# Patient Record
Sex: Male | Born: 1960 | Race: Black or African American | Hispanic: No | Marital: Single | Smoking: Current every day smoker
Health system: Southern US, Community
[De-identification: ages and names within clinical notes are randomized; demographics above are authoritative.]

## PROBLEM LIST (undated history)

## (undated) DIAGNOSIS — I10 Essential (primary) hypertension: Secondary | ICD-10-CM

## (undated) DIAGNOSIS — Z9911 Dependence on respirator [ventilator] status: Secondary | ICD-10-CM

## (undated) DIAGNOSIS — R569 Unspecified convulsions: Secondary | ICD-10-CM

## (undated) DIAGNOSIS — F1010 Alcohol abuse, uncomplicated: Secondary | ICD-10-CM

## (undated) DIAGNOSIS — E119 Type 2 diabetes mellitus without complications: Secondary | ICD-10-CM

---

## 2006-03-24 ENCOUNTER — Emergency Department: Payer: Self-pay | Admitting: Emergency Medicine

## 2010-10-06 ENCOUNTER — Emergency Department: Payer: Self-pay | Admitting: Emergency Medicine

## 2010-10-21 ENCOUNTER — Emergency Department: Payer: Self-pay | Admitting: Emergency Medicine

## 2010-12-24 ENCOUNTER — Inpatient Hospital Stay: Payer: Self-pay | Admitting: Internal Medicine

## 2011-03-22 ENCOUNTER — Inpatient Hospital Stay: Payer: Self-pay | Admitting: Internal Medicine

## 2011-04-10 ENCOUNTER — Inpatient Hospital Stay: Payer: Self-pay | Admitting: Psychiatry

## 2012-04-12 LAB — CBC
HCT: 42.5 % (ref 40.0–52.0)
HGB: 14.2 g/dL (ref 13.0–18.0)
MCH: 30.5 pg (ref 26.0–34.0)
MCHC: 33.3 g/dL (ref 32.0–36.0)
MCV: 92 fL (ref 80–100)
Platelet: 232 10*3/uL (ref 150–440)
RDW: 12.8 % (ref 11.5–14.5)

## 2012-04-12 LAB — COMPREHENSIVE METABOLIC PANEL
Albumin: 3.9 g/dL (ref 3.4–5.0)
Anion Gap: 19 — ABNORMAL HIGH (ref 7–16)
BUN: 7 mg/dL (ref 7–18)
Bilirubin,Total: 0.3 mg/dL (ref 0.2–1.0)
EGFR (African American): >60
EGFR (Non-African Amer.): 59 — ABNORMAL LOW
Osmolality: 279 (ref 275–301)
Potassium: 3.4 mmol/L — ABNORMAL LOW (ref 3.5–5.1)
SGOT(AST): 38 U/L — ABNORMAL HIGH (ref 15–37)
Total Protein: 8.1 g/dL (ref 6.4–8.2)

## 2012-04-12 LAB — TSH: Thyroid Stimulating Horm: 1.41 u[IU]/mL

## 2012-04-12 LAB — PHENYTOIN LEVEL, TOTAL: Dilantin: 1.8 ug/mL — ABNORMAL LOW (ref 10.0–20.0)

## 2012-04-13 ENCOUNTER — Inpatient Hospital Stay: Payer: Self-pay | Admitting: Psychiatry

## 2012-04-13 LAB — URINALYSIS, COMPLETE
Bilirubin,UR: NEGATIVE
Cellular Cast: 4
Glucose,UR: 50 mg/dL (ref 0–75)
Granular Cast: 3
Hyaline Cast: 10
Nitrite: NEGATIVE
Ph: 5 (ref 4.5–8.0)
Specific Gravity: 1.008 (ref 1.003–1.030)
Squamous Epithelial: <1
WBC UR: 5 /HPF (ref 0–5)

## 2012-04-13 LAB — DRUG SCREEN, URINE
Amphetamines, Ur Screen: NEGATIVE (ref ?–1000)
Barbiturates, Ur Screen: NEGATIVE (ref ?–200)
Cannabinoid 50 Ng, Ur ~~LOC~~: NEGATIVE (ref ?–50)
Cocaine Metabolite,Ur ~~LOC~~: NEGATIVE (ref ?–300)
MDMA (Ecstasy)Ur Screen: NEGATIVE (ref ?–500)
Methadone, Ur Screen: NEGATIVE (ref ?–300)
Phencyclidine (PCP) Ur S: NEGATIVE (ref ?–25)
Tricyclic, Ur Screen: NEGATIVE (ref ?–1000)

## 2015-01-04 ENCOUNTER — Emergency Department: Payer: Self-pay | Admitting: Emergency Medicine

## 2015-01-04 LAB — URINALYSIS, COMPLETE
BACTERIA: NONE SEEN
BLOOD: NEGATIVE
Bilirubin,UR: NEGATIVE
Glucose,UR: NEGATIVE mg/dL (ref 0–75)
KETONE: NEGATIVE
Leukocyte Esterase: NEGATIVE
Nitrite: NEGATIVE
PROTEIN: NEGATIVE
Ph: 5 (ref 4.5–8.0)
RBC,UR: <1 /HPF (ref 0–5)
SPECIFIC GRAVITY: 1.016 (ref 1.003–1.030)
Squamous Epithelial: NONE SEEN
WBC UR: <1 /HPF (ref 0–5)

## 2015-02-08 ENCOUNTER — Emergency Department: Payer: Self-pay | Admitting: Student

## 2015-02-13 ENCOUNTER — Emergency Department: Payer: Self-pay | Admitting: Emergency Medicine

## 2015-03-11 ENCOUNTER — Emergency Department: Payer: Self-pay | Admitting: Emergency Medicine

## 2015-03-12 ENCOUNTER — Emergency Department: Payer: Self-pay | Admitting: Emergency Medicine

## 2015-03-14 ENCOUNTER — Emergency Department: Payer: Self-pay | Admitting: Emergency Medicine

## 2015-04-22 NOTE — H&P (Signed)
PATIENT NAME:  Brandon Silva, Brandon Silva MR#:  [id number] DATE OF BIRTH:  06-15-1961  DATE OF ADMISSION:  04/12/2012  REFERRING PHYSICIAN: Lowella FairyJohn Woodruff, MD   ATTENDING PHYSICIAN: Brandon Millward B. Jennet MaduroPucilowska, MD   IDENTIFYING DATA: [name] is a 54 year old male with history of psychotic depression.   CHIEF COMPLAINT: I have responsibilities at home.   HISTORY OF PRESENT ILLNESS: [name] has not been consistent taking his medications as prescribed at discharge from [facility] in April of 2012. He does go to [facility] but missed some of his medication. Apparently Risperdal was discontinued or he just stopped taking it. He was reportedly doing fine up until the day of admission when his girlfriend moved back to [place]. He overdosed on medications. His mother found him unresponsive with a bunch of empty pill bottles. He was brought to the Emergency Room. He reports that he remembers little of the event but does admit that he is heartbroken after the girlfriend left. He also points out that he was drunk when he overdosed and wants to be discharged to home as quickly as possible to go back home to take care of his puppy. He reports doing well until a recent conflict with the girlfriend. He did not realize that she was leaving until someone came to pick her up. He endorses on and off psychotic symptoms, slight paranoia, but mostly auditory hallucinations. It is unclear why Risperdal was stopped. He denies excessive drinking or illicit substance use and feels that getting drunk on the night of admission was a stupid thing to do. He denies symptoms suggestive of bipolar mania. No excessive anxiety.   PAST PSYCHIATRIC HISTORY: He had several prior hospitalizations. He was admitted once in 2001 after an overdose to CCU here and was hospitalized as above in April of 2012 for worsening of depression and psychotic symptoms. He had a period of time when he was using cocaine but claims to be sober now.    FAMILY PSYCHIATRIC HISTORY: None reported.   PAST MEDICAL HISTORY:  1. Diabetes.  2. History of seizures. 3. Gout. 4. Hypertension.   ALLERGIES: Penicillin.   MEDICATIONS ON ADMISSION:  1. Allopurinol 300 mg daily. 2. Insulin 70/30 35 units in the morning; 16 units at bedtime.  3. Metformin 500 mg twice daily.  4. Paxil 20 mg daily.  5. Risperdal 2 mg daily. Unclear if the patient was compliant.   SOCIAL HISTORY: [place], [place]  over a year ago. He lives in a camper on his mother's property. He does not have income. His girlfriend left him but he takes care of a puppy. It is unclear why the patient did not apply for disability.   REVIEW OF SYSTEMS: CONSTITUTIONAL: No fevers or chills. No weight changes. EYES: No double or blurred vision. ENT: No hearing loss. RESPIRATORY: No shortness of breath or cough. CARDIOVASCULAR: No chest pain or orthopnea. GASTROINTESTINAL: No abdominal pain, nausea, vomiting, or diarrhea. GU: No incontinence or frequency. ENDOCRINE: No heat or cold intolerance. LYMPHATIC: No anemia or easy bruising. INTEGUMENTARY: No acne or rash. MUSCULOSKELETAL: No muscle or joint pain. NEUROLOGIC: No tingling or weakness. PSYCHIATRIC: See history of present illness for details.   PHYSICAL EXAMINATION:   VITAL SIGNS: Blood pressure 100/55, pulse 99, respirations 24, temperature 98.5.   GENERAL: This is a slightly obese male in no acute distress.   HEENT: The pupils are equal, round, and reactive to light. Sclerae anicteric.   NECK: Supple. No thyromegaly.  LUNGS: Clear to auscultation. No dullness to percussion.   HEART: Regular rhythm and rate. No murmurs, rubs, or gallops.  ABDOMEN: Soft, nontender, nondistended. Positive bowel sounds.   MUSCULOSKELETAL: Normal muscle strength in all extremities.   SKIN: No rashes or bruises.   LYMPHATIC: No cervical adenopathy.   NEUROLOGIC: Cranial nerves II through XII are intact.    LABORATORY DATA: Chemistries glucose 136, BUN 7, creatinine 1.4, sodium 140, potassium 3.4. Blood alcohol level 0.19. LFTs within normal limits except for AST of 38. Cardiac enzymes CK 726. CK-MB 3.7. TSH 1.41. Dilantin 1.8. Urine tox screen negative for substances. CBC within normal limits. Urinalysis is not suggestive of urinary tract infection. Serum acetaminophen 53; on recheck 20. Serum salicylates 1.7. EKG normal sinus rhythm, nonspecific T wave abnormality, prolonged QT. Abnormal EKG.  MENTAL STATUS EXAMINATION ON ADMISSION: The patient is evaluated in the Emergency Room. He is asleep initially. Reportedly he was agitated. He is easily arousable but not enthusiastic to talk to me. He is oriented to person, place, time, and somewhat situation. He is marginally groomed. He is marginally cooperative. He maintains little eye contact. His speech is soft. There is paucity of speech. His mood is depressed with flat affect. Thought processing is logical and goal oriented. Thought content he denies suicidal or homicidal ideation but was admitted after a suicide attempt. He does not disclose any paranoid or delusional thinking. There is a history of auditory hallucinations but the patient does not appear to attend to internal stimuli during the interview. His cognition is impaired and difficult to assess today. His insight and judgment are poor.   SUICIDE RISK ASSESSMENT ON ADMISSION: This is a patient with a long history of depression, psychosis who overdosed on medication in the context of relationship problems.   ASSESSMENT:  AXIS I:  1. Major depressive disorder, recurrent, severe with psychosis.  2. Alcohol abuse.   AXIS II: Deferred.    AXIS III:  1. Diabetes. 2. Seizures. 3. Gout.   AXIS IV: Mental and physical illness, relationship problems, financial, housing, occupational, social support, access to care.   AXIS V: GAF on admission 25.   PLAN: The patient was admitted to [facility] Medicine Unit for safety, stabilization, and medication management. He was initially placed on suicide precautions and will be closely monitored for any unsafe behaviors. He underwent full psychiatric and risk assessment. He received pharmacotherapy, individual and group psychotherapy, substance abuse counseling, and support from therapeutic milieu.  1. Suicidal ideation. The patient denies now and is able to contract for safety.  2. Mood. We will restart him on Paxil and Risperdal as in the community.  3. Substance abuse. The patient minimizes and declines treatment at present.  4. Disposition. He will be discharged to home and follow-up with [facility].    ____________________________ Ellin Goodie. Jennet Maduro, MD jbp:drc D: 04/13/2012 12:03:53 ET T: 04/13/2012 12:22:31 ET JOB#: [id number]  cc: Alfretta Pinch B. Jennet Maduro, MD, <Dictator> Shari Prows MD ELECTRONICALLY SIGNED 04/16/2012 4:05
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
## 2015-04-29 NOTE — Consult Note (Signed)
PATIENT NAME:  Brandon Silva, Brandon Silva MR#:  [id number] DATE OF BIRTH:  02/12/1961  DATE OF CONSULTATION:  02/14/2015  CONSULTING PHYSICIAN:  Audery AmelJohn T. Clapacs, MD  IDENTIFYING INFORMATION AND REASON FOR CONSULTATION: A 54 year old male with a history of substance abuse and mental health symptoms, who presented to the hospital after having a seizure yesterday.   CHIEF COMPLAINT: "Can I go?"  HISTORY OF PRESENT ILLNESS: Information obtained from the patient and the chart. It is reported that the patient had multiple seizures at home, witnessed by his mother. The patient says that he was drinking yesterday and estimates he had about a 12-pack. He claims that he rarely drinks and that was unusual for him, but at the same time, he cannot give any reason why he might have been drinking more yesterday. Denies that he has been using any other drugs. Says that he is prescribed Dilantin but takes it rarely, maybe only every now and then, for some reason, but not regularly. The patient denies any mood symptoms. Denies problems with depression. Denies suicidal ideation. Denies any hallucinations or psychotic symptoms. Says that he sleeps okay. According to the report when he came into the hospital, his mother had witnessed several seizures over the last few days. There is no report here of anything about suicidality or other psychiatric symptoms.   PAST PSYCHIATRIC HISTORY: [name] has had several prior visits to our hospital, but the last one was in 2013. In the past, he has presented at times with depression and psychotic symptoms but has usually been very intoxicated at the time. He has been variously diagnosed as being primarily substance abuse and primarily mental health. In interview with me today, the patient completely denies all this. Denies that he has ever been in a psychiatric hospital. Denies he has ever been on any psychiatric medicine. Denies he has ever tried to harm himself. We have it clearly documented  that he used to be on Risperdal and other antipsychotics.   SOCIAL HISTORY: The patient is living by himself. He works doing "farming." Mother lives nearby. Seems to have otherwise limited social contact.   FAMILY HISTORY: Denies any family history of mental health problems or substance abuse problems.   PAST MEDICAL HISTORY: He denies having any significant ongoing medical problems initially, but on further conversation states that he does have diabetes and high blood pressure. Says that he takes his insulin regularly. Also is on a high blood pressure medicine, but does not remember what it is. Denies any history of heart attacks or strokes.   SUBSTANCE ABUSE HISTORY: Long history of alcohol and cocaine abuse. The patient is denying it to me, saying that the alcohol use has been rare.   REVIEW OF SYSTEMS: Currently denies depression. Denies suicidal ideation. Denies hallucinations. Denies any pain, malaise or any physical symptoms at all.   MENTAL STATUS EXAMINATION: Adequately groomed gentleman who looks his stated age, cooperative with the interview although tends to answer very superficially. Eye contact good. Psychomotor activity normal. Speech decreased in total amount but normal in tone and volume. Affect is euthymic and reactive. Mood stated as fine. Thoughts are lucid but simple. No obvious delusions. Denies hallucinations. Denies any suicidal or homicidal ideation. He is alert and oriented x4. Can remember 3/3 objects immediately and at 3 minutes. Judgment and insight debatable, long term. Intelligence probably normal range.   LABORATORY RESULTS: Drug screen positive only for benzodiazepines, probably given on the way into the hospital. TSH normal. Alcohol level was 286.  Dilantin undetectable. Calcium low at 8.3, potassium low at 3.4. CBC all normal. Salicylates and acetaminophen negative. Blood sugars are running in the hundreds.   VITAL SIGNS: Blood pressure 138/72, respirations 20, pulse  74, temperature 98.6.   ASSESSMENT: A 54 year old man with a history of substance abuse and a history of past mood and psychotic symptoms, who is currently presenting after a seizure, likely related to his alcohol abuse. The patient is denying any mental health history, although we have it clearly documented. Right now there is no sign of acute suicidality, homicidality or acute dangerousness. Does not require hospital level treatment psychiatrically.   TREATMENT PLAN: The patient is educated about the relationship between alcohol use and seizures and strongly encouraged to make this a priority in stopping drinking. As he has no other acute psychiatric symptoms, we have no further referral for him right now. He is aware that he can get psychiatric treatment outpatient if he needs it. The case discussed with Emergency Room physician. IVC discontinued.   DIAGNOSIS, PRINCIPAL AND PRIMARY:   AXIS I: Alcohol abuse, moderate to severe.   SECONDARY DIAGNOSES:  AXIS I: Deferred.  AXIS II: Deferred.  AXIS III:  1. Seizure, probably related to alcohol.  2. History of high blood pressure and diabetes.    ____________________________ Audery Amel, MD jtc:je D: 02/14/2015 13:25:49 ET T: 02/14/2015 13:55:26 ET JOB#: [id number]  cc: Audery Amel, MD, <Dictator> Audery Amel MD ELECTRONICALLY SIGNED 03/06/2015 10:33

## 2015-05-01 ENCOUNTER — Emergency Department: Payer: Self-pay

## 2015-05-01 ENCOUNTER — Emergency Department
Admission: EM | Admit: 2015-05-01 | Discharge: 2015-05-02 | Disposition: A | Discharge status: Home / Self Care | Payer: Self-pay | Attending: Emergency Medicine | Admitting: Emergency Medicine

## 2015-05-01 DIAGNOSIS — R200 Anesthesia of skin: Secondary | ICD-10-CM | POA: Insufficient documentation

## 2015-05-01 DIAGNOSIS — R531 Weakness: Secondary | ICD-10-CM | POA: Insufficient documentation

## 2015-05-01 DIAGNOSIS — F10920 Alcohol use, unspecified with intoxication, uncomplicated: Secondary | ICD-10-CM

## 2015-05-01 DIAGNOSIS — R569 Unspecified convulsions: Secondary | ICD-10-CM | POA: Insufficient documentation

## 2015-05-01 DIAGNOSIS — E119 Type 2 diabetes mellitus without complications: Secondary | ICD-10-CM | POA: Insufficient documentation

## 2015-05-01 DIAGNOSIS — F10120 Alcohol abuse with intoxication, uncomplicated: Secondary | ICD-10-CM | POA: Insufficient documentation

## 2015-05-01 DIAGNOSIS — Z720 Tobacco use: Secondary | ICD-10-CM | POA: Insufficient documentation

## 2015-05-01 HISTORY — DX: Type 2 diabetes mellitus without complications: E11.9

## 2015-05-01 HISTORY — DX: Alcohol abuse, uncomplicated: F10.10

## 2015-05-01 LAB — CBC
HCT: 48.3 % (ref 40.0–52.0)
HEMOGLOBIN: 16.3 g/dL (ref 13.0–18.0)
MCH: 31 pg (ref 26.0–34.0)
MCHC: 33.7 g/dL (ref 32.0–36.0)
MCV: 91.8 fL (ref 80.0–100.0)
PLATELETS: 225 10*3/uL (ref 150–440)
RBC: 5.26 MIL/uL (ref 4.40–5.90)
RDW: 13 % (ref 11.5–14.5)
WBC: 7 10*3/uL (ref 3.8–10.6)

## 2015-05-01 LAB — BASIC METABOLIC PANEL
Anion gap: 9 (ref 5–15)
BUN: 10 mg/dL (ref 6–20)
CALCIUM: 9.4 mg/dL (ref 8.9–10.3)
CO2: 25 mmol/L (ref 22–32)
Chloride: 106 mmol/L (ref 101–111)
Creatinine, Ser: 1.09 mg/dL (ref 0.61–1.24)
GFR calc Af Amer: >60 mL/min (ref >60)
GFR calc non Af Amer: >60 mL/min (ref >60)
Glucose, Bld: 142 mg/dL — ABNORMAL HIGH (ref 65–99)
POTASSIUM: 4.1 mmol/L (ref 3.5–5.1)
SODIUM: 140 mmol/L (ref 135–145)

## 2015-05-01 LAB — URINE DRUG SCREEN, QUALITATIVE (ARMC ONLY)
Amphetamines, Ur Screen: NOT DETECTED
BARBITURATES, UR SCREEN: NOT DETECTED
BENZODIAZEPINE, UR SCRN: POSITIVE — AB
Cannabinoid 50 Ng, Ur ~~LOC~~: NOT DETECTED
Cocaine Metabolite,Ur ~~LOC~~: NOT DETECTED
MDMA (Ecstasy)Ur Screen: NOT DETECTED
METHADONE SCREEN, URINE: NOT DETECTED
Opiate, Ur Screen: NOT DETECTED
Phencyclidine (PCP) Ur S: NOT DETECTED
Tricyclic, Ur Screen: NOT DETECTED

## 2015-05-01 LAB — HEPATIC FUNCTION PANEL
ALBUMIN: 4.2 g/dL (ref 3.5–5.0)
ALT: 29 U/L (ref 17–63)
AST: 30 U/L (ref 15–41)
Alkaline Phosphatase: 99 U/L (ref 38–126)
BILIRUBIN TOTAL: 0.4 mg/dL (ref 0.3–1.2)
Bilirubin, Direct: <0.1 mg/dL — ABNORMAL LOW (ref 0.1–0.5)
TOTAL PROTEIN: 7.8 g/dL (ref 6.5–8.1)

## 2015-05-01 LAB — ETHANOL: ALCOHOL ETHYL (B): 263 mg/dL — AB (ref <5)

## 2015-05-01 LAB — GLUCOSE, CAPILLARY: Glucose-Capillary: 135 mg/dL — ABNORMAL HIGH (ref 70–99)

## 2015-05-01 MED ORDER — THIAMINE HCL 100 MG/ML IJ SOLN
Freq: Once | INTRAVENOUS | Status: AC
  Administered 2015-05-01: 22:00:00 via INTRAVENOUS
  Filled 2015-05-01: qty 1000

## 2015-05-01 MED ORDER — LORAZEPAM 2 MG/ML IJ SOLN
INTRAMUSCULAR | Status: AC
  Filled 2015-05-01: qty 1

## 2015-05-01 MED ORDER — LORAZEPAM 2 MG/ML IJ SOLN
2.0000 mg | Freq: Once | INTRAMUSCULAR | Status: AC
  Administered 2015-05-01: 2 mg via INTRAVENOUS

## 2015-05-01 MED ORDER — ZIPRASIDONE MESYLATE 20 MG IM SOLR: 10.0000 mg | Freq: Once | INTRAMUSCULAR | Status: DC

## 2015-05-01 NOTE — ED Notes (Signed)
Pt belongings put into bag with pt label and [facility].

## 2015-05-01 NOTE — ED Notes (Signed)
Pt resting with door open . Alarm on .
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
## 2015-05-01 NOTE — ED Notes (Signed)
Rn notified of patient having seizure like activity on the floor. MD at bedside.
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
## 2015-05-02 LAB — URINALYSIS COMPLETE WITH MICROSCOPIC (ARMC ONLY)
BILIRUBIN URINE: NEGATIVE
Bacteria, UA: NONE SEEN
GLUCOSE, UA: NEGATIVE mg/dL
KETONES UR: NEGATIVE mg/dL
Leukocytes, UA: NEGATIVE
Nitrite: NEGATIVE
PH: 6 (ref 5.0–8.0)
Protein, ur: NEGATIVE mg/dL
SPECIFIC GRAVITY, URINE: 1.002 — AB (ref 1.005–1.030)
Squamous Epithelial / LPF: NONE SEEN
WBC UA: NONE SEEN WBC/hpf (ref 0–5)

## 2015-05-02 NOTE — ED Provider Notes (Signed)
-----------------------------------------   3:21 AM on 05/02/2015 -----------------------------------------  Assuming care from Dr. Derrill KayGoodman.  In short, Brandon Silva is a 54 y.o. male with a chief complaint of Seizures .  Refer to the original H&P for additional details.   ----------------------------------------- 3:21 AM on 05/02/2015 -----------------------------------------  Patient now calm, cooperative, clinically sober. Patient has eaten without issue, ambulating without issue. Patient has called a ride who is picking him up. We will discharge patient home.   Minna AntisKevin Cardarius Senat, MD 05/02/15 [id number]

## 2015-05-02 NOTE — Discharge Instructions (Signed)
Alcohol Intoxication Alcohol intoxication occurs when the amount of alcohol that a person has consumed impairs his or her ability to mentally and physically function. Alcohol directly impairs the normal chemical activity of the brain. Drinking large amounts of alcohol can lead to changes in mental function and behavior, and it can cause many physical effects that can be harmful.  Alcohol intoxication can range in severity from mild to very severe. Various factors can affect the level of intoxication that occurs, such as the person's age, gender, weight, frequency of alcohol consumption, and the presence of other medical conditions (such as diabetes, seizures, or heart conditions). Dangerous levels of alcohol intoxication may occur when people drink large amounts of alcohol in a short period (binge drinking). Alcohol can also be especially dangerous when combined with certain prescription medicines or "recreational" drugs. SIGNS AND SYMPTOMS Some common signs and symptoms of mild alcohol intoxication include:  Loss of coordination.  Changes in mood and behavior.  Impaired judgment.  Slurred speech. As alcohol intoxication progresses to more severe levels, other signs and symptoms will appear. These may include:  Vomiting.  Confusion and impaired memory.  Slowed breathing.  Seizures.  Loss of consciousness. DIAGNOSIS  Your health care provider will take a medical history and perform a physical exam. You will be asked about the amount and type of alcohol you have consumed. Blood tests will be done to measure the concentration of alcohol in your blood. In many places, your blood alcohol level must be lower than 80 mg/dL (1.61%0.08%) to legally drive. However, many dangerous effects of alcohol can occur at much lower levels.  TREATMENT  People with alcohol intoxication often do not require treatment. Most of the effects of alcohol intoxication are temporary, and they go away as the alcohol naturally  leaves the body. Your health care provider will monitor your condition until you are stable enough to go home. Fluids are sometimes given through an IV access tube to help prevent dehydration.  HOME CARE INSTRUCTIONS  Do not drive after drinking alcohol.  Stay hydrated. Drink enough water and fluids to keep your urine clear or pale yellow. Avoid caffeine.   Only take over-the-counter or prescription medicines as directed by your health care provider.  SEEK MEDICAL CARE IF:   You have persistent vomiting.   You do not feel better after a few days.  You have frequent alcohol intoxication. Your health care provider can help determine if you should see a substance use treatment counselor. SEEK IMMEDIATE MEDICAL CARE IF:   You become shaky or tremble when you try to stop drinking.   You shake uncontrollably (seizure).   You throw up (vomit) blood. This may be bright red or may look like black coffee grounds.   You have blood in your stool. This may be bright red or may appear as a black, tarry, bad smelling stool.   You become lightheaded or faint.  MAKE SURE YOU:   Understand these instructions.  Will watch your condition.  Will get help right away if you are not doing well or get worse. Document Released: 09/24/2005 Document Revised: 08/17/2013 Document Reviewed: 05/20/2013 [facility] Patient Information 2015 [place], [place]. This information is not intended to replace advice given to you by your health care provider. Make sure you discuss any questions you have with your health care provider.  Seizure, Adult A seizure means there is unusual activity in the brain. A seizure can cause changes in attention or behavior. Seizures often cause shaking (convulsions).  Seizures often last from 30 seconds to 2 minutes. HOME CARE   If you are given medicines, take them exactly as told by your doctor.  Keep all doctor visits as told.  Do not swim or drive until your doctor says it  is okay.  Teach others what to do if you have a seizure. They should:  Lay you on the ground.  Put a cushion under your head.  Loosen any tight clothing around your neck.  Turn you on your side.  Stay with you until you get better. GET HELP RIGHT AWAY IF:   The seizure lasts longer than 2 to 5 minutes.  The seizure is very bad.  The person does not wake up after the seizure.  The person's attention or behavior changes. Drive the person to the emergency room or call your local emergency services (911 in [place] MAKE SURE YOU:   Understand these instructions.  Will watch your condition.  Will get help right away if you are not doing well or get worse. Document Released: 06/02/2008 Document Revised: 03/08/2012 Document Reviewed: 12/03/2011 [facility] Patient Information 2015 [place], [place]. This information is not intended to replace advice given to you by your health care provider. Make sure you discuss any questions you have with your health care provider.

## 2015-05-02 NOTE — ED Notes (Signed)
MD in to see pt. Ve

## 2015-05-02 NOTE — Progress Notes (Signed)
Patient was not able to be aroused to conduct triage assessment.

## 2015-05-02 NOTE — ED Notes (Signed)
Arrest warrant paper work found in pt room after pt discharged by ED tech.  [employer] officer Vear Clockhillips made aware of paperwork.

## 2016-02-04 ENCOUNTER — Encounter: Payer: Self-pay | Admitting: Emergency Medicine

## 2016-02-04 ENCOUNTER — Inpatient Hospital Stay
Admission: EM | Admit: 2016-02-04 | Discharge: 2016-02-05 | DRG: 101 | Discharge status: Against Medical Advice | Payer: Self-pay | Attending: Internal Medicine | Admitting: Internal Medicine

## 2016-02-04 ENCOUNTER — Emergency Department: Payer: Self-pay

## 2016-02-04 DIAGNOSIS — Z880 Allergy status to penicillin: Secondary | ICD-10-CM

## 2016-02-04 DIAGNOSIS — E160 Drug-induced hypoglycemia without coma: Secondary | ICD-10-CM | POA: Diagnosis present

## 2016-02-04 DIAGNOSIS — T383X5A Adverse effect of insulin and oral hypoglycemic [antidiabetic] drugs, initial encounter: Secondary | ICD-10-CM | POA: Diagnosis present

## 2016-02-04 DIAGNOSIS — E11649 Type 2 diabetes mellitus with hypoglycemia without coma: Secondary | ICD-10-CM | POA: Diagnosis present

## 2016-02-04 DIAGNOSIS — Z794 Long term (current) use of insulin: Secondary | ICD-10-CM

## 2016-02-04 DIAGNOSIS — Z833 Family history of diabetes mellitus: Secondary | ICD-10-CM

## 2016-02-04 DIAGNOSIS — G8384 Todd's paralysis (postepileptic): Secondary | ICD-10-CM | POA: Diagnosis present

## 2016-02-04 DIAGNOSIS — F17210 Nicotine dependence, cigarettes, uncomplicated: Secondary | ICD-10-CM | POA: Diagnosis present

## 2016-02-04 DIAGNOSIS — R569 Unspecified convulsions: Secondary | ICD-10-CM

## 2016-02-04 DIAGNOSIS — F1010 Alcohol abuse, uncomplicated: Secondary | ICD-10-CM | POA: Diagnosis present

## 2016-02-04 DIAGNOSIS — G40901 Epilepsy, unspecified, not intractable, with status epilepticus: Principal | ICD-10-CM | POA: Diagnosis present

## 2016-02-04 DIAGNOSIS — Z9114 Patient's other noncompliance with medication regimen: Secondary | ICD-10-CM

## 2016-02-04 HISTORY — DX: Unspecified convulsions: R56.9

## 2016-02-04 LAB — URINALYSIS COMPLETE WITH MICROSCOPIC (ARMC ONLY)
BILIRUBIN URINE: NEGATIVE
Bacteria, UA: NONE SEEN
Glucose, UA: NEGATIVE mg/dL
KETONES UR: NEGATIVE mg/dL
Leukocytes, UA: NEGATIVE
Nitrite: NEGATIVE
Protein, ur: NEGATIVE mg/dL
Specific Gravity, Urine: 1.004 — ABNORMAL LOW (ref 1.005–1.030)
Squamous Epithelial / LPF: NONE SEEN
pH: 5 (ref 5.0–8.0)

## 2016-02-04 LAB — COMPREHENSIVE METABOLIC PANEL
ALT: 22 U/L (ref 17–63)
ANION GAP: 12 (ref 5–15)
AST: 51 U/L — ABNORMAL HIGH (ref 15–41)
Albumin: 4.6 g/dL (ref 3.5–5.0)
Alkaline Phosphatase: 82 U/L (ref 38–126)
BUN: 13 mg/dL (ref 6–20)
CALCIUM: 8.9 mg/dL (ref 8.9–10.3)
CHLORIDE: 104 mmol/L (ref 101–111)
CO2: 22 mmol/L (ref 22–32)
CREATININE: 1.32 mg/dL — AB (ref 0.61–1.24)
Glucose, Bld: 48 mg/dL — ABNORMAL LOW (ref 65–99)
Potassium: 3.7 mmol/L (ref 3.5–5.1)
Sodium: 138 mmol/L (ref 135–145)
Total Bilirubin: 0.4 mg/dL (ref 0.3–1.2)
Total Protein: 8 g/dL (ref 6.5–8.1)

## 2016-02-04 LAB — URINE DRUG SCREEN, QUALITATIVE (ARMC ONLY)
AMPHETAMINES, UR SCREEN: NOT DETECTED
Barbiturates, Ur Screen: NOT DETECTED
Benzodiazepine, Ur Scrn: POSITIVE — AB
Cannabinoid 50 Ng, Ur ~~LOC~~: NOT DETECTED
Cocaine Metabolite,Ur ~~LOC~~: NOT DETECTED
MDMA (ECSTASY) UR SCREEN: NOT DETECTED
Methadone Scn, Ur: NOT DETECTED
OPIATE, UR SCREEN: NOT DETECTED
PHENCYCLIDINE (PCP) UR S: NOT DETECTED
Tricyclic, Ur Screen: NOT DETECTED

## 2016-02-04 LAB — GLUCOSE, CAPILLARY
GLUCOSE-CAPILLARY: 258 mg/dL — AB (ref 65–99)
Glucose-Capillary: 33 mg/dL — CL (ref 65–99)
Glucose-Capillary: 216 mg/dL — ABNORMAL HIGH (ref 65–99)
Glucose-Capillary: 163 mg/dL — ABNORMAL HIGH (ref 65–99)

## 2016-02-04 LAB — CBC WITH DIFFERENTIAL/PLATELET
BASOS ABS: 0.1 10*3/uL (ref 0–0.1)
BASOS PCT: 1 %
Eosinophils Absolute: 0.2 10*3/uL (ref 0–0.7)
Eosinophils Relative: 2 %
HCT: 45.8 % (ref 40.0–52.0)
Hemoglobin: 15.2 g/dL (ref 13.0–18.0)
Lymphocytes Relative: 35 %
Lymphs Abs: 4.2 10*3/uL — ABNORMAL HIGH (ref 1.0–3.6)
MCH: 29.1 pg (ref 26.0–34.0)
MCHC: 33.1 g/dL (ref 32.0–36.0)
MCV: 87.8 fL (ref 80.0–100.0)
MONO ABS: 0.6 10*3/uL (ref 0.2–1.0)
Monocytes Relative: 5 %
NEUTROS ABS: 6.9 10*3/uL — AB (ref 1.4–6.5)
Neutrophils Relative %: 57 %
PLATELETS: 230 10*3/uL (ref 150–440)
RBC: 5.22 MIL/uL (ref 4.40–5.90)
RDW: 12.6 % (ref 11.5–14.5)
WBC: 11.9 10*3/uL — ABNORMAL HIGH (ref 3.8–10.6)

## 2016-02-04 LAB — ACETAMINOPHEN LEVEL: Acetaminophen (Tylenol), Serum: <10 ug/mL — ABNORMAL LOW (ref 10–30)

## 2016-02-04 LAB — PHOSPHORUS: PHOSPHORUS: 3.2 mg/dL (ref 2.5–4.6)

## 2016-02-04 LAB — SALICYLATE LEVEL: Salicylate Lvl: <4 mg/dL (ref 2.8–30.0)

## 2016-02-04 LAB — ETHANOL: ALCOHOL ETHYL (B): 250 mg/dL — AB (ref <5)

## 2016-02-04 LAB — MAGNESIUM: Magnesium: 2.2 mg/dL (ref 1.7–2.4)

## 2016-02-04 LAB — LIPASE, BLOOD: LIPASE: 21 U/L (ref 11–51)

## 2016-02-04 MED ORDER — FOLIC ACID 5 MG/ML IJ SOLN
1.0000 mg | Freq: Every day | INTRAMUSCULAR | Status: DC
  Administered 2016-02-05: 1 mg via INTRAVENOUS
  Filled 2016-02-04 (×3): qty 0.2

## 2016-02-04 MED ORDER — MORPHINE SULFATE (PF) 2 MG/ML IV SOLN: 2.0000 mg | INTRAVENOUS | Status: DC | PRN

## 2016-02-04 MED ORDER — SODIUM CHLORIDE 0.9% FLUSH
3.0000 mL | Freq: Two times a day (BID) | INTRAVENOUS | Status: DC
  Administered 2016-02-04: 3 mL via INTRAVENOUS

## 2016-02-04 MED ORDER — SODIUM CHLORIDE 0.9 % IV BOLUS (SEPSIS)
1000.0000 mL | Freq: Once | INTRAVENOUS | Status: AC
  Administered 2016-02-04: 1000 mL via INTRAVENOUS

## 2016-02-04 MED ORDER — THIAMINE HCL 100 MG/ML IJ SOLN
100.0000 mg | Freq: Every day | INTRAMUSCULAR | Status: DC
  Administered 2016-02-04: 100 mg via INTRAVENOUS
  Filled 2016-02-04: qty 2

## 2016-02-04 MED ORDER — LORAZEPAM 2 MG/ML IJ SOLN
INTRAMUSCULAR | Status: AC
  Administered 2016-02-04: 6 mg via INTRAVENOUS
  Filled 2016-02-04: qty 1

## 2016-02-04 MED ORDER — ONDANSETRON HCL 4 MG PO TABS: 4.0000 mg | ORAL_TABLET | Freq: Four times a day (QID) | ORAL | Status: DC | PRN

## 2016-02-04 MED ORDER — OXYCODONE HCL 5 MG PO TABS: 5.0000 mg | ORAL_TABLET | ORAL | Status: DC | PRN

## 2016-02-04 MED ORDER — LORAZEPAM 2 MG/ML IJ SOLN
INTRAMUSCULAR | Status: AC
  Filled 2016-02-04: qty 2

## 2016-02-04 MED ORDER — ACETAMINOPHEN 650 MG RE SUPP: 650.0000 mg | Freq: Four times a day (QID) | RECTAL | Status: DC | PRN

## 2016-02-04 MED ORDER — SODIUM CHLORIDE 0.9 % IV SOLN: 20.0000 mg/kg | Freq: Once | INTRAVENOUS | Status: DC

## 2016-02-04 MED ORDER — ACETAMINOPHEN 325 MG PO TABS: 650.0000 mg | ORAL_TABLET | Freq: Four times a day (QID) | ORAL | Status: DC | PRN

## 2016-02-04 MED ORDER — SODIUM CHLORIDE 0.9 % IV SOLN
2000.0000 mg | Freq: Once | INTRAVENOUS | Status: AC
  Administered 2016-02-04: 2000 mg via INTRAVENOUS
  Filled 2016-02-04: qty 40

## 2016-02-04 MED ORDER — LORAZEPAM 2 MG/ML IJ SOLN
2.0000 mg | INTRAMUSCULAR | Status: DC | PRN
  Administered 2016-02-04: 2 mg via INTRAVENOUS
  Filled 2016-02-04: qty 2

## 2016-02-04 MED ORDER — ENOXAPARIN SODIUM 40 MG/0.4ML ~~LOC~~ SOLN
40.0000 mg | SUBCUTANEOUS | Status: DC
  Administered 2016-02-04: 40 mg via SUBCUTANEOUS
  Filled 2016-02-04: qty 0.4

## 2016-02-04 MED ORDER — DEXTROSE 5 % AND 0.9 % NACL IV BOLUS
1000.0000 mL | Freq: Once | INTRAVENOUS | Status: AC
  Administered 2016-02-04: 1000 mL via INTRAVENOUS
  Filled 2016-02-04: qty 1000

## 2016-02-04 MED ORDER — LORAZEPAM 2 MG/ML IJ SOLN: 4.0000 mg | Freq: Once | INTRAMUSCULAR | Status: DC

## 2016-02-04 MED ORDER — ONDANSETRON HCL 4 MG/2ML IJ SOLN
4.0000 mg | Freq: Four times a day (QID) | INTRAMUSCULAR | Status: DC | PRN
Start: 2016-02-04 — End: 2016-02-05

## 2016-02-04 MED ORDER — DEXTROSE 50 % IV SOLN
50.0000 g | Freq: Once | INTRAVENOUS | Status: AC
  Administered 2016-02-04: 50 g via INTRAVENOUS
  Filled 2016-02-04: qty 100

## 2016-02-04 MED ORDER — LORAZEPAM 2 MG/ML IJ SOLN
6.0000 mg | Freq: Once | INTRAMUSCULAR | Status: AC
  Administered 2016-02-04: 6 mg via INTRAVENOUS

## 2016-02-04 MED ORDER — DEXTROSE 50 % IV SOLN
INTRAVENOUS | Status: AC
  Administered 2016-02-04: 50 g via INTRAVENOUS
  Filled 2016-02-04: qty 50

## 2016-02-04 NOTE — ED Notes (Signed)
[place] notified of blood sugar

## 2016-02-04 NOTE — H&P (Signed)
[facility] - [place] [facility]   PATIENT NAME: Brandon Silva    MR#:  [id number]  DATE OF BIRTH:  03-13-1961   DATE OF ADMISSION:  02/04/2016  PRIMARY CARE PHYSICIAN: No PCP Per Patient   REQUESTING/REFERRING PHYSICIAN: Scotty Court  CHIEF COMPLAINT:   Chief Complaint  Patient presents with  . Seizures    HISTORY OF PRESENT ILLNESS:  Blaike Silva  is a 55 y.o. male with a known history of seizure disorder, alcohol abuse, type 2 diabetes insulin requiring presenting with seizures. The patient is unable to provide meaningful information given mental status/medical condition. History obtained from girlfriend present at bedside. She states that he has a history of seizures noncompliant with his medication, had an multiple seizures today so she gave him 30 units of insulin unspecified type, "some Dilantin" and then called EMS. He continued to have witnessed seizures tonic-clonic received Valium in emergency department. He is now post ictal and unable to provide meaningful information  PAST MEDICAL HISTORY:   Past Medical History  Diagnosis Date  . Diabetes mellitus without complication (HCC)   . Alcohol abuse   . Seizures (HCC)     PAST SURGICAL HISTORY:  History reviewed. No pertinent past surgical history.  SOCIAL HISTORY:   Social History  Substance Use Topics  . Smoking status: Current Every Day Smoker    Types: Cigarettes  . Smokeless tobacco: Not on file  . Alcohol Use: Yes    FAMILY HISTORY:   Family History  Problem Relation Age of Onset  . Diabetes Other     DRUG ALLERGIES:   Allergies  Allergen Reactions  . Penicillins     REVIEW OF SYSTEMS:  Unable to obtain given patient's mental status medical condition   MEDICATIONS AT HOME:   Prior to Admission medications   Not on File      VITAL SIGNS:  Blood pressure 103/70, pulse 73, temperature 98.3 F (36.8 C), temperature source Axillary, resp. rate 15, weight 230 lb  (104.327 kg), SpO2 99 %.  PHYSICAL EXAMINATION:   VITAL SIGNS: Filed Vitals:   02/04/16 1930 02/04/16 2000  BP: 107/62 103/70  Pulse: 82 73  Temp:    Resp: 16 15   GENERAL:54 y.o.male moderate distress given mental status.  HEAD: Normocephalic, atraumatic.  EYES: Pupils equal, round, reactive to light. Unable to assess extraocular muscles given mental status/medical condition. No scleral icterus.  MOUTH: Moist mucosal membrane. Dentition intact. No abscess noted.  EAR, NOSE, THROAT: Clear without exudates. No external lesions.  NECK: Supple. No thyromegaly. No nodules. No JVD.  PULMONARY: Clear to ascultation, without wheeze rails or rhonci. No use of accessory muscles, Good respiratory effort. good air entry bilaterally CHEST: Nontender to palpation.  CARDIOVASCULAR: S1 and S2. Regular rate and rhythm. No murmurs, rubs, or gallops. No edema. Pedal pulses 2+ bilaterally.  GASTROINTESTINAL: Soft, nontender, nondistended. No masses. Positive bowel sounds. No hepatosplenomegaly.  MUSCULOSKELETAL: No swelling, clubbing, or edema. Passive Range of motion full in all extremities.  NEUROLOGIC: Unable to assess given mental status/medical condition SKIN: No ulceration, lesions, rashes, or cyanosis. Skin warm and dry. Turgor intact.  PSYCHIATRIC: Unable to assess given mental status/medical condition     LABORATORY PANEL:   CBC  Recent Labs Lab 02/04/16 1732  WBC 11.9*  HGB 15.2  HCT 45.8  PLT 230   ------------------------------------------------------------------------------------------------------------------  Chemistries   Recent Labs Lab 02/04/16 1732  NA 138  K 3.7  CL 104  CO2 22  GLUCOSE 48*  BUN 13  CREATININE 1.32*  CALCIUM 8.9  MG 2.2  AST 51*  ALT 22  ALKPHOS 82  BILITOT 0.4   ------------------------------------------------------------------------------------------------------------------  Cardiac Enzymes No results for input(s): TROPONINI in the  last 168 hours. ------------------------------------------------------------------------------------------------------------------  RADIOLOGY:  Ct Head Wo Contrast  02/04/2016  CLINICAL DATA:  55 year old male with multiple seizures and right-sided weakness EXAM: CT HEAD WITHOUT CONTRAST TECHNIQUE: Contiguous axial images were obtained from the base of the skull through the vertex without intravenous contrast. COMPARISON:  CT dated 05/01/2015 FINDINGS: Evaluation of this exam is limited due to motion artifact. The ventricles and sulci are appropriate in size for patient's age. Minimal periventricular and deep white matter hypodensities represent chronic microvascular ischemic changes. There is no intracranial hemorrhage. No mass effect or midline shift identified. The visualized paranasal sinuses and mastoid air cells are well aerated. The calvarium is intact. Bilateral exophthalmos. IMPRESSION: No acute intracranial hemorrhage. Minimal chronic microvascular ischemic disease. If symptoms persist and there are no contraindications, MRI may provide better evaluation if clinically indicated Electronically Signed   By: Elgie Collard M.D.   On: 02/04/2016 18:13    EKG:   Orders placed or performed during the hospital encounter of 02/04/16  . EKG 12-Lead  . EKG 12-Lead    IMPRESSION AND PLAN:   55 year old African-American gentleman history of seizure disorder alcohol abuse type 2 diabetes insulin requiring with seizures  1. Seizures: Seizure precaution, neuro check, neurology consult as already consult in emergency department 2. Alcohol abuse: CIWA 3. Type 2 diabetes with Hypoglycemia: Likely from insulin administered by significant other, follow Accu-Cheks every 2 hours until stabilized continue D5 infusion 4. Venous thromboembolism prophylactic: Lovenox   All the records are reviewed and case discussed with ED provider. Management plans discussed with the patient, family and they are in  agreement.  CODE STATUS: Full  TOTAL TIME TAKING CARE OF THIS PATIENT: 40 minutes.    Hower,  Brandon Silva on 02/04/2016 at 8:33 PM  Between 7am to 6pm - Pager - [phone]  After 6pm: House Pager: - [phone]  [facility]  Office  [phone]  CC: Primary care physician; No PCP Per Patient

## 2016-02-04 NOTE — ED Notes (Signed)
Per EMS pt had 15 seizures today.  7 were witnessed by ems. etoh on board per ems. Has nto taken seizure medicine since may 2016

## 2016-02-04 NOTE — ED Notes (Addendum)
Per girlfriend pt has drank 10 40 oz beers today.  She also gave pt some dilantin (has not been taking since last year) and she does not know how much.  She also gave him 30 units of insulin; she does not know what time or what type.

## 2016-02-04 NOTE — ED Notes (Signed)
Pt to CT emergently with RN and monitor.

## 2016-02-04 NOTE — ED Notes (Addendum)
While this RN was attempting to check blood glucose, pt's body became stiff and he began to shake. Ativan given. While Ativan given, pt attempting to sit up and.

## 2016-02-04 NOTE — ED Provider Notes (Signed)
[facility] Emergency Department Provider Note  ____________________________________________  Time seen: 5:30 PM on arrival by EMS  I have reviewed the triage vital signs and the nursing notes.   HISTORY  Chief Complaint Seizures    HPI Brandon Silva is a 55 y.o. male brought to the ED for recurrent seizures today. Patient reports striking about 1040 ounce beers today. He does have a seizure history but does not take Dilantin due to running out of it more than a year ago. Denies any recent trauma. No other recent illness. EMS report that the patient had about 7 seizures with them and they gave him a total of 7.5 mg of Versed.Currently he is awake and alert, reports that he feels like he can't move the right side of his body.  His girlfriend reports that when he started having seizures today, she gave him his usual 30 units of insulin even though he has not been eating today. She is not able to state what kind of insulin it is.   Past Medical History  Diagnosis Date  . Diabetes mellitus without complication (HCC)   . Alcohol abuse   . Seizures (HCC)      There are no active problems to display for this patient.    History reviewed. No pertinent past surgical history.   No current outpatient prescriptions on file. Insulin  Allergies Penicillins   History reviewed. No pertinent family history.  Social History Social History  Substance Use Topics  . Smoking status: Current Every Day Smoker    Types: Cigarettes  . Smokeless tobacco: None  . Alcohol Use: Yes    Review of Systems  Constitutional:   No fever or chills. No weight changes Eyes:   No blurry vision or double vision.  ENT:   No sore throat. Cardiovascular:   No chest pain. Respiratory:   No dyspnea or cough. Gastrointestinal:   Negative for abdominal pain, vomiting and diarrhea.  No BRBPR or melena. Genitourinary:   Negative for dysuria, urinary retention, bloody urine, or  difficulty urinating. Musculoskeletal:   Negative for back pain. No joint swelling or pain. Skin:   Negative for rash. Neurological:   Recurrent seizures. Weakness of the right arm and leg. No headache Psychiatric:  No anxiety or depression.   Endocrine:  No hot/cold intolerance, changes in energy, or sleep difficulty.  10-point ROS otherwise negative.  ____________________________________________   PHYSICAL EXAM:  VITAL SIGNS: ED Triage Vitals  Enc Vitals Group     BP 02/04/16 1728 115/71 mmHg     Pulse Rate 02/04/16 1728 100     Resp 02/04/16 1728 25     Temp 02/04/16 1728 98.3 F (36.8 C)     Temp Source 02/04/16 1728 Axillary     SpO2 02/04/16 1728 98 %     Weight 02/04/16 1724 230 lb (104.327 kg)     Height --      Head Cir --      Peak Flow --      Pain Score --      Pain Loc --      Pain Edu? --      Excl. in GC? --     Vital signs reviewed, nursing assessments reviewed.   Constitutional:   Alert and oriented. Groggy Eyes:   No scleral icterus. No conjunctival pallor. PERRL. EOMI ENT   Head:   Normocephalic and atraumatic.   Nose:   No congestion/rhinnorhea. No septal hematoma   Mouth/Throat:  MMM, no pharyngeal erythema. No peritonsillar mass. No uvula shift.   Neck:   No stridor. No SubQ emphysema. No meningismus. Hematological/Lymphatic/Immunilogical:   No cervical lymphadenopathy. Cardiovascular:   RRR. Normal and symmetric distal pulses are present in all extremities. No murmurs, rubs, or gallops. Respiratory:   Normal respiratory effort without tachypnea nor retractions. Breath sounds are clear and equal bilaterally. No wheezes/rales/rhonchi. Gastrointestinal:   Soft and nontender. No distention. There is no CVA tenderness.  No rebound, rigidity, or guarding. Genitourinary:   deferred Musculoskeletal:   Nontender with normal range of motion in all extremities. No joint effusions.  No lower extremity tenderness.  No edema. Neurologic:    Normal speech and language.  CN 2-10 normal. Paralysis of right upper and lower extremities. Complete loss of sensation in the right arm and right leg. Skin:    Skin is warm, dry and intact. No rash noted.  No petechiae, purpura, or bullae. Psychiatric:   Mood and affect are normal. Speech and behavior are normal. Patient exhibits appropriate insight and judgment.  ____________________________________________    LABS (pertinent positives/negatives) (all labs ordered are listed, but only abnormal results are displayed) Labs Reviewed  COMPREHENSIVE METABOLIC PANEL - Abnormal; Notable for the following:    Glucose, Bld 48 (*)    Creatinine, Ser 1.32 (*)    AST 51 (*)    All other components within normal limits  ETHANOL - Abnormal; Notable for the following:    Alcohol, Ethyl (B) 250 (*)    All other components within normal limits  CBC WITH DIFFERENTIAL/PLATELET - Abnormal; Notable for the following:    WBC 11.9 (*)    Neutro Abs 6.9 (*)    Lymphs Abs 4.2 (*)    All other components within normal limits  ACETAMINOPHEN LEVEL - Abnormal; Notable for the following:    Acetaminophen (Tylenol), Serum <10 (*)    All other components within normal limits  URINALYSIS COMPLETEWITH MICROSCOPIC ([facility] ONLY) - Abnormal; Notable for the following:    Color, Urine STRAW (*)    APPearance CLEAR (*)    Specific Gravity, Urine 1.004 (*)    Hgb urine dipstick 1+ (*)    All other components within normal limits  GLUCOSE, CAPILLARY - Abnormal; Notable for the following:    Glucose-Capillary 33 (*)    All other components within normal limits  GLUCOSE, CAPILLARY - Abnormal; Notable for the following:    Glucose-Capillary 258 (*)    All other components within normal limits  GLUCOSE, CAPILLARY - Abnormal; Notable for the following:    Glucose-Capillary 216 (*)    All other components within normal limits  LIPASE, BLOOD  SALICYLATE LEVEL  URINE DRUG SCREEN, QUALITATIVE ([facility] ONLY)  PHOSPHORUS   MAGNESIUM   ____________________________________________   EKG   ____________________________________________    RADIOLOGY  CT head unremarkable  ____________________________________________   PROCEDURES CRITICAL CARE Performed by: Scotty Court, Ludean Duhart   Total critical care time: 35 minutes  Critical care time was exclusive of separately billable procedures and treating other patients.  Critical care was necessary to treat or prevent imminent or life-threatening deterioration.  Critical care was time spent personally by me on the following activities: development of treatment plan with patient and/or surrogate as well as nursing, discussions with consultants, evaluation of patient's response to treatment, examination of patient, obtaining history from patient or surrogate, ordering and performing treatments and interventions, ordering and review of laboratory studies, ordering and review of radiographic studies, pulse oximetry and re-evaluation of patient's  condition.   ____________________________________________   INITIAL IMPRESSION / ASSESSMENT AND PLAN / ED COURSE  Pertinent labs & imaging results that were available during my care of the patient were reviewed by me and considered in my medical decision making (see chart for details).  Patient presents with recurrent seizures. They're happening frequently and in the emergency department he is also having frequent seizures. Each seizure lasted 30-60 seconds seems to resolve spontaneously. He quickly regained alertness, but has persistent paralysis of the right side of his body not involving his cranial nerves.  Patient required 6 mg of IV Versed just prior to obtaining CT scan due to her recurrent frequent seizures. CT negative. Even though his initial glucose was 72 per EMS, subsequent fingerstick checks revealed a fingerstick glucose in the 30s after CT which is likely due to the insulin bolus that his significant other  gave him prior to arrival in the emergency department. The seizures started before he was given the insulin and his initial fingerstick per EMS was okay so there appear to be due to reasons why he seizing at this point, likely epilepsy exacerbation due to medication noncompliance and alcohol abuse and hypoglycemia. Patient was loaded with fosphenytoin in the emergency department. I discussed the case with neurology who agrees with admission for now and will evaluate in the hospital. Case discussed with the hospitalist. seizures improved after receiving 2 Amps of D50 and fosphenytoin. We will continue the patient on a D5 drip. Labs significant for an ethanol level of 250 but otherwise unremarkable.       ____________________________________________   FINAL CLINICAL IMPRESSION(S) / ED DIAGNOSES  Final diagnoses:  Status epilepticus, generalized convulsive (HCC)  Todd's paralysis (postepileptic) (HCC)  Hypoglycemia due to insulin      Sharman Cheek, MD 02/04/16 1933

## 2016-02-04 NOTE — ED Notes (Signed)
Attempted to call report, receiving nurse was in another room giving medication. Waiting for call back at this time.

## 2016-02-05 LAB — BASIC METABOLIC PANEL
ANION GAP: 8 (ref 5–15)
BUN: 12 mg/dL (ref 6–20)
CALCIUM: 7.9 mg/dL — AB (ref 8.9–10.3)
CO2: 23 mmol/L (ref 22–32)
Chloride: 108 mmol/L (ref 101–111)
Creatinine, Ser: 1.16 mg/dL (ref 0.61–1.24)
GFR calc non Af Amer: >60 mL/min (ref >60)
Glucose, Bld: 126 mg/dL — ABNORMAL HIGH (ref 65–99)
Potassium: 4.1 mmol/L (ref 3.5–5.1)
Sodium: 139 mmol/L (ref 135–145)

## 2016-02-05 LAB — GLUCOSE, CAPILLARY
GLUCOSE-CAPILLARY: 56 mg/dL — AB (ref 65–99)
GLUCOSE-CAPILLARY: 137 mg/dL — AB (ref 65–99)
Glucose-Capillary: 100 mg/dL — ABNORMAL HIGH (ref 65–99)
Glucose-Capillary: 142 mg/dL — ABNORMAL HIGH (ref 65–99)
Glucose-Capillary: 51 mg/dL — ABNORMAL LOW (ref 65–99)
Glucose-Capillary: 84 mg/dL (ref 65–99)

## 2016-02-05 MED ORDER — INFLUENZA VAC SPLIT QUAD 0.5 ML IM SUSY: 0.5000 mL | PREFILLED_SYRINGE | INTRAMUSCULAR | Status: DC

## 2016-02-05 MED ORDER — CETYLPYRIDINIUM CHLORIDE 0.05 % MT LIQD: 7.0000 mL | Freq: Two times a day (BID) | OROMUCOSAL | Status: DC

## 2016-02-05 MED ORDER — DEXTROSE 50 % IV SOLN
25.0000 mL | Freq: Once | INTRAVENOUS | Status: AC
  Administered 2016-02-05: 25 mL via INTRAVENOUS
  Filled 2016-02-05: qty 50

## 2016-02-05 NOTE — Discharge Summary (Signed)
[facility] - [place] at [facility]   PATIENT NAME: Brandon Silva    MR#:  [id number]  DATE OF BIRTH:  11-27-1961  DATE OF ADMISSION:  02/04/2016 ADMITTING PHYSICIAN: Wyatt Haste, MD  DATE OF DISCHARGE: 02/05/2016  8:50 AM  PRIMARY CARE PHYSICIAN: No PCP Per Patient    ADMISSION DIAGNOSIS:  Status epilepticus, generalized convulsive (HCC) [G40.901] Hypoglycemia due to insulin [E16.0, T38.3X5A] Todd's paralysis (postepileptic) (HCC) [G83.84]   DISCHARGE DIAGNOSIS:    SECONDARY DIAGNOSIS:   Past Medical History  Diagnosis Date  . Diabetes mellitus without complication (HCC)   . Alcohol abuse   . Seizures (HCC)     HOSPITAL COURSE:   1. Seizures, noncompliant with his medication. He was on seizure precaution, neuro check, neurology consult as already consult in emergency department. He was treated with Dilantin IV in the ED and started by mouth. He is alert, awake and oriented. He denies any symptoms. He wanted to go home today. I advised the patient stayed in the hospital to continue Dilantin and check Dilantin level in a.m. I emphasized the importance of compliance to medication. But the patient left AMA later this morning.  2. Alcohol abuse: on CIWA 3. Type 2 diabetes with Hypoglycemia: Likely from insulin administered by significant other, follow Accu-Cheks every 2 hours until stabilized continue D5 infusion  DISCHARGE CONDITIONS:   The patient left AMA.  CONSULTS OBTAINED:  Treatment Team:  Wyatt Haste, MD  DRUG ALLERGIES:   Allergies  Allergen Reactions  . Penicillins     DISCHARGE MEDICATIONS:  There are no discharge medications for this patient.    DISCHARGE INSTRUCTIONS:    If you experience worsening of your admission symptoms, develop shortness of breath, life threatening emergency, suicidal or homicidal thoughts you must seek medical attention immediately by calling 911 or calling your MD immediately  if symptoms less  severe.  You Must read complete instructions/literature along with all the possible adverse reactions/side effects for all the Medicines you take and that have been prescribed to you. Take any new Medicines after you have completely understood and accept all the possible adverse reactions/side effects.   Please note  You were cared for by a hospitalist during your hospital stay. If you have any questions about your discharge medications or the care you received while you were in the hospital after you are discharged, you can call the unit and asked to speak with the hospitalist on call if the hospitalist that took care of you is not available. Once you are discharged, your primary care physician will handle any further medical issues. Please note that NO REFILLS for any discharge medications will be authorized once you are discharged, as it is imperative that you return to your primary care physician (or establish a relationship with a primary care physician if you do not have one) for your aftercare needs so that they can reassess your need for medications and monitor your lab values.    Today   SUBJECTIVE    No complaint.  VITAL SIGNS:  Blood pressure 117/56, pulse 96, temperature 98.3 F (36.8 C), temperature source Oral, resp. rate 17, height 6' (1.829 m), weight 95.346 kg (210 lb 3.2 oz), SpO2 92 %.  I/O:   Intake/Output Summary (Last 24 hours) at 02/05/16 1637 Last data filed at 02/05/16 0700  Gross per 24 hour  Intake   1240 ml  Output   2975 ml  Net  -1735 ml    PHYSICAL  EXAMINATION:  GENERAL:  55 y.o.-year-old patient lying in the bed with no acute distress.  EYES: Pupils equal, round, reactive to light and accommodation. No scleral icterus. Extraocular muscles intact.  HEENT: Head atraumatic, normocephalic. Oropharynx and nasopharynx clear.  NECK:  Supple, no jugular venous distention. No thyroid enlargement, no tenderness.  LUNGS: Normal breath sounds bilaterally, no  wheezing, rales,rhonchi or crepitation. No use of accessory muscles of respiration.  CARDIOVASCULAR: S1, S2 normal. No murmurs, rubs, or gallops.  ABDOMEN: Soft, non-tender, non-distended. Bowel sounds present. No organomegaly or mass.  EXTREMITIES: No pedal edema, cyanosis, or clubbing.  NEUROLOGIC: Cranial nerves II through XII are intact. Muscle strength 5/5 in all extremities. Sensation intact. Gait not checked.  PSYCHIATRIC: The patient is alert and oriented x 3.  SKIN: No obvious rash, lesion, or ulcer.   DATA REVIEW:   CBC  Recent Labs Lab 02/04/16 1732  WBC 11.9*  HGB 15.2  HCT 45.8  PLT 230    Chemistries   Recent Labs Lab 02/04/16 1732 02/05/16 0403  NA 138 139  K 3.7 4.1  CL 104 108  CO2 22 23  GLUCOSE 48* 126*  BUN 13 12  CREATININE 1.32* 1.16  CALCIUM 8.9 7.9*  MG 2.2  --   AST 51*  --   ALT 22  --   ALKPHOS 82  --   BILITOT 0.4  --     Cardiac Enzymes No results for input(s): TROPONINI in the last 168 hours.  Microbiology Results  No results found for this or any previous visit.  RADIOLOGY:  Ct Head Wo Contrast  02/04/2016  CLINICAL DATA:  55 year old male with multiple seizures and right-sided weakness EXAM: CT HEAD WITHOUT CONTRAST TECHNIQUE: Contiguous axial images were obtained from the base of the skull through the vertex without intravenous contrast. COMPARISON:  CT dated 05/01/2015 FINDINGS: Evaluation of this exam is limited due to motion artifact. The ventricles and sulci are appropriate in size for patient's age. Minimal periventricular and deep white matter hypodensities represent chronic microvascular ischemic changes. There is no intracranial hemorrhage. No mass effect or midline shift identified. The visualized paranasal sinuses and mastoid air cells are well aerated. The calvarium is intact. Bilateral exophthalmos. IMPRESSION: No acute intracranial hemorrhage. Minimal chronic microvascular ischemic disease. If symptoms persist and there  are no contraindications, MRI may provide better evaluation if clinically indicated Electronically Signed   By: Elgie Collard M.D.   On: 02/04/2016 18:13        Management plans discussed with the patient, family and they are in agreement.  CODE STATUS:  Code Status History    Date Active Date Inactive Code Status Order ID Comments User Context   02/04/2016  7:46 PM 02/05/2016 12:57 PM Full Code [id number]  Wyatt Haste, MD ED      TOTAL TIME TAKING CARE OF THIS PATIENT: 42 minutes.    Shaune Pollack M.D on 02/05/2016 at 4:37 PM  Between 7am to 6pm - Pager - [phone]  After 6pm go to [email or web address] - password [email or web address] [facility]  [facility]  Office  [phone]  CC: Primary care physician; No PCP Per Patient

## 2016-02-05 NOTE — Progress Notes (Signed)
Patient refuses to stay in the hospital and was made aware that it is unsafe for him to leave. Patient insist on leaving and Dr. Imogene Burn notified and came to the floor to talk with patient and patient still demands to leave AMA. Patient signed AMA  form and telemetry discontinued, and IV discontinued sites without s/s of infiltration or infection. Patient Alert to self and no seizure activity noted. Patient girlfriend at the bedside to take patient home.

## 2016-02-06 LAB — GLUCOSE, CAPILLARY: GLUCOSE-CAPILLARY: 210 mg/dL — AB (ref 65–99)

## 2016-02-17 NOTE — ED Provider Notes (Signed)
 -----------------------------------------   4:02 PM on 02/17/2016 -----------------------------------------  During my evaluation of the patient 02/04/2016, I did interpret the EKG consider in my medical decision making. Interpretation is as follows Sinus tachycardia rate 102. Normal axis intervals QRS and ST segments and T waves.  Sharman Cheek, MD 02/17/16 [phone]

## 2016-04-12 ENCOUNTER — Emergency Department: Admission: EM | Admit: 2016-04-12 | Discharge: 2016-04-12 | Discharge status: Against Medical Advice | Payer: Self-pay

## 2016-04-12 ENCOUNTER — Encounter: Payer: Self-pay | Admitting: Emergency Medicine

## 2016-04-12 NOTE — ED Notes (Signed)
Patient noted to be not in waiting room.

## 2016-04-12 NOTE — ED Notes (Signed)
Pt states was sent in per pt - is very intoxicated. Wants detox

## 2016-09-17 ENCOUNTER — Emergency Department (HOSPITAL_COMMUNITY): Payer: Self-pay

## 2016-09-17 ENCOUNTER — Encounter (HOSPITAL_COMMUNITY): Payer: Self-pay | Admitting: *Deleted

## 2016-09-17 ENCOUNTER — Inpatient Hospital Stay (HOSPITAL_COMMUNITY)
Admission: EM | Admit: 2016-09-17 | Discharge: 2016-09-21 | DRG: 101 | Discharge status: Against Medical Advice | Payer: Self-pay | Attending: Pulmonary Disease | Admitting: Pulmonary Disease

## 2016-09-17 ENCOUNTER — Inpatient Hospital Stay (HOSPITAL_COMMUNITY): Payer: Self-pay

## 2016-09-17 DIAGNOSIS — R569 Unspecified convulsions: Secondary | ICD-10-CM

## 2016-09-17 DIAGNOSIS — N179 Acute kidney failure, unspecified: Secondary | ICD-10-CM | POA: Diagnosis present

## 2016-09-17 DIAGNOSIS — Z833 Family history of diabetes mellitus: Secondary | ICD-10-CM

## 2016-09-17 DIAGNOSIS — E119 Type 2 diabetes mellitus without complications: Secondary | ICD-10-CM | POA: Diagnosis present

## 2016-09-17 DIAGNOSIS — Z880 Allergy status to penicillin: Secondary | ICD-10-CM

## 2016-09-17 DIAGNOSIS — F1020 Alcohol dependence, uncomplicated: Secondary | ICD-10-CM | POA: Diagnosis present

## 2016-09-17 DIAGNOSIS — G9340 Encephalopathy, unspecified: Secondary | ICD-10-CM | POA: Diagnosis present

## 2016-09-17 DIAGNOSIS — E876 Hypokalemia: Secondary | ICD-10-CM | POA: Diagnosis present

## 2016-09-17 DIAGNOSIS — J9600 Acute respiratory failure, unspecified whether with hypoxia or hypercapnia: Secondary | ICD-10-CM

## 2016-09-17 DIAGNOSIS — E872 Acidosis: Secondary | ICD-10-CM | POA: Diagnosis present

## 2016-09-17 DIAGNOSIS — W19XXXA Unspecified fall, initial encounter: Secondary | ICD-10-CM | POA: Diagnosis present

## 2016-09-17 DIAGNOSIS — Y908 Blood alcohol level of 240 mg/100 ml or more: Secondary | ICD-10-CM | POA: Diagnosis present

## 2016-09-17 DIAGNOSIS — Z9911 Dependence on respirator [ventilator] status: Secondary | ICD-10-CM

## 2016-09-17 DIAGNOSIS — Z9119 Patient's noncompliance with other medical treatment and regimen: Secondary | ICD-10-CM

## 2016-09-17 DIAGNOSIS — F17210 Nicotine dependence, cigarettes, uncomplicated: Secondary | ICD-10-CM | POA: Diagnosis present

## 2016-09-17 DIAGNOSIS — E1110 Type 2 diabetes mellitus with ketoacidosis without coma: Secondary | ICD-10-CM | POA: Insufficient documentation

## 2016-09-17 DIAGNOSIS — G8384 Todd's paralysis (postepileptic): Secondary | ICD-10-CM | POA: Diagnosis present

## 2016-09-17 DIAGNOSIS — G40909 Epilepsy, unspecified, not intractable, without status epilepticus: Principal | ICD-10-CM | POA: Diagnosis present

## 2016-09-17 DIAGNOSIS — R451 Restlessness and agitation: Secondary | ICD-10-CM | POA: Diagnosis present

## 2016-09-17 DIAGNOSIS — F1010 Alcohol abuse, uncomplicated: Secondary | ICD-10-CM | POA: Insufficient documentation

## 2016-09-17 LAB — BASIC METABOLIC PANEL
Anion gap: 13 (ref 5–15)
BUN: 12 mg/dL (ref 6–20)
CALCIUM: 8.5 mg/dL — AB (ref 8.9–10.3)
CHLORIDE: 106 mmol/L (ref 101–111)
CO2: 19 mmol/L — ABNORMAL LOW (ref 22–32)
CREATININE: 1.19 mg/dL (ref 0.61–1.24)
Glucose, Bld: 142 mg/dL — ABNORMAL HIGH (ref 65–99)
Potassium: 3.7 mmol/L (ref 3.5–5.1)
SODIUM: 138 mmol/L (ref 135–145)

## 2016-09-17 LAB — I-STAT CHEM 8, ED
BUN: 13 mg/dL (ref 6–20)
CALCIUM ION: 1.1 mmol/L — AB (ref 1.15–1.40)
CHLORIDE: 105 mmol/L (ref 101–111)
Creatinine, Ser: 1.6 mg/dL — ABNORMAL HIGH (ref 0.61–1.24)
GLUCOSE: 130 mg/dL — AB (ref 65–99)
HCT: 46 % (ref 39.0–52.0)
Hemoglobin: 15.6 g/dL (ref 13.0–17.0)
Potassium: 3.8 mmol/L (ref 3.5–5.1)
SODIUM: 140 mmol/L (ref 135–145)
TCO2: 23 mmol/L (ref 0–100)

## 2016-09-17 LAB — I-STAT ARTERIAL BLOOD GAS, ED
Acid-base deficit: 3 mmol/L — ABNORMAL HIGH (ref 0.0–2.0)
Bicarbonate: 21.8 mmol/L (ref 20.0–28.0)
O2 Saturation: 100 %
PCO2 ART: 39 mmHg (ref 32.0–48.0)
PH ART: 7.357 (ref 7.350–7.450)
TCO2: 23 mmol/L (ref 0–100)
pO2, Arterial: 481 mmHg — ABNORMAL HIGH (ref 83.0–108.0)

## 2016-09-17 LAB — CBC
HCT: 39.6 % (ref 39.0–52.0)
HEMATOCRIT: 44.5 % (ref 39.0–52.0)
HEMOGLOBIN: 15.1 g/dL (ref 13.0–17.0)
HEMOGLOBIN: 13.4 g/dL (ref 13.0–17.0)
MCH: 30.7 pg (ref 26.0–34.0)
MCH: 30.7 pg (ref 26.0–34.0)
MCHC: 33.9 g/dL (ref 30.0–36.0)
MCHC: 33.8 g/dL (ref 30.0–36.0)
MCV: 90.4 fL (ref 78.0–100.0)
MCV: 90.6 fL (ref 78.0–100.0)
PLATELETS: 214 10*3/uL (ref 150–400)
Platelets: 237 10*3/uL (ref 150–400)
RBC: 4.92 MIL/uL (ref 4.22–5.81)
RBC: 4.37 MIL/uL (ref 4.22–5.81)
RDW: 12.9 % (ref 11.5–15.5)
RDW: 12.9 % (ref 11.5–15.5)
WBC: 8.4 10*3/uL (ref 4.0–10.5)
WBC: 6.8 10*3/uL (ref 4.0–10.5)

## 2016-09-17 LAB — GLUCOSE, CAPILLARY
GLUCOSE-CAPILLARY: 153 mg/dL — AB (ref 65–99)
GLUCOSE-CAPILLARY: 127 mg/dL — AB (ref 65–99)
Glucose-Capillary: 143 mg/dL — ABNORMAL HIGH (ref 65–99)
Glucose-Capillary: 109 mg/dL — ABNORMAL HIGH (ref 65–99)
Glucose-Capillary: 97 mg/dL (ref 65–99)

## 2016-09-17 LAB — RAPID URINE DRUG SCREEN, HOSP PERFORMED
AMPHETAMINES: NOT DETECTED
Barbiturates: NOT DETECTED
Benzodiazepines: POSITIVE — AB
Cocaine: POSITIVE — AB
OPIATES: NOT DETECTED
TETRAHYDROCANNABINOL: NOT DETECTED

## 2016-09-17 LAB — TROPONIN I
Troponin I: <0.03 ng/mL (ref <0.03)
Troponin I: <0.03 ng/mL (ref <0.03)

## 2016-09-17 LAB — COMPREHENSIVE METABOLIC PANEL
ALK PHOS: 80 U/L (ref 38–126)
ALT: 20 U/L (ref 17–63)
ANION GAP: 12 (ref 5–15)
AST: 31 U/L (ref 15–41)
Albumin: 4.2 g/dL (ref 3.5–5.0)
BILIRUBIN TOTAL: 0.7 mg/dL (ref 0.3–1.2)
BUN: 12 mg/dL (ref 6–20)
CALCIUM: 9.1 mg/dL (ref 8.9–10.3)
CO2: 21 mmol/L — ABNORMAL LOW (ref 22–32)
CREATININE: 1.31 mg/dL — AB (ref 0.61–1.24)
Chloride: 105 mmol/L (ref 101–111)
GFR calc non Af Amer: >60 mL/min (ref >60)
Glucose, Bld: 130 mg/dL — ABNORMAL HIGH (ref 65–99)
Potassium: 3.7 mmol/L (ref 3.5–5.1)
Sodium: 138 mmol/L (ref 135–145)
TOTAL PROTEIN: 7.4 g/dL (ref 6.5–8.1)

## 2016-09-17 LAB — DIFFERENTIAL
Basophils Absolute: 0 10*3/uL (ref 0.0–0.1)
Basophils Relative: 1 %
EOS PCT: 1 %
Eosinophils Absolute: 0.1 10*3/uL (ref 0.0–0.7)
LYMPHS ABS: 3 10*3/uL (ref 0.7–4.0)
LYMPHS PCT: 36 %
MONO ABS: 0.4 10*3/uL (ref 0.1–1.0)
Monocytes Relative: 5 %
NEUTROS ABS: 4.8 10*3/uL (ref 1.7–7.7)
Neutrophils Relative %: 57 %

## 2016-09-17 LAB — I-STAT TROPONIN, ED: Troponin i, poc: 0.01 ng/mL (ref 0.00–0.08)

## 2016-09-17 LAB — PHOSPHORUS
PHOSPHORUS: 3.4 mg/dL (ref 2.5–4.6)
Phosphorus: 2.6 mg/dL (ref 2.5–4.6)
Phosphorus: 2.7 mg/dL (ref 2.5–4.6)

## 2016-09-17 LAB — SALICYLATE LEVEL

## 2016-09-17 LAB — MAGNESIUM
MAGNESIUM: 1.9 mg/dL (ref 1.7–2.4)
MAGNESIUM: 1.9 mg/dL (ref 1.7–2.4)
Magnesium: 2.1 mg/dL (ref 1.7–2.4)

## 2016-09-17 LAB — ETHANOL: Alcohol, Ethyl (B): 266 mg/dL — ABNORMAL HIGH (ref <5)

## 2016-09-17 LAB — MRSA PCR SCREENING: MRSA by PCR: NEGATIVE

## 2016-09-17 LAB — LACTIC ACID, PLASMA
Lactic Acid, Venous: 3.1 mmol/L (ref 0.5–1.9)
Lactic Acid, Venous: 3.2 mmol/L (ref 0.5–1.9)

## 2016-09-17 LAB — TRIGLYCERIDES: Triglycerides: 72 mg/dL (ref <150)

## 2016-09-17 LAB — TSH: TSH: 0.529 u[IU]/mL (ref 0.350–4.500)

## 2016-09-17 LAB — PROTIME-INR
INR: 1.16
Prothrombin Time: 14.9 seconds (ref 11.4–15.2)

## 2016-09-17 LAB — APTT: aPTT: 28 seconds (ref 24–36)

## 2016-09-17 LAB — ACETAMINOPHEN LEVEL

## 2016-09-17 LAB — CBG MONITORING, ED: GLUCOSE-CAPILLARY: 126 mg/dL — AB (ref 65–99)

## 2016-09-17 MED ORDER — VITAL HIGH PROTEIN PO LIQD
1000.0000 mL | ORAL | Status: DC
  Administered 2016-09-17: 1000 mL
  Administered 2016-09-17 (×2)

## 2016-09-17 MED ORDER — ORAL CARE MOUTH RINSE
15.0000 mL | OROMUCOSAL | Status: DC
  Administered 2016-09-17 – 2016-09-20 (×32): 15 mL via OROMUCOSAL

## 2016-09-17 MED ORDER — FENTANYL CITRATE (PF) 100 MCG/2ML IJ SOLN: 100.0000 ug | INTRAMUSCULAR | Status: DC | PRN

## 2016-09-17 MED ORDER — VITAL HIGH PROTEIN PO LIQD
1000.0000 mL | ORAL | Status: DC
  Administered 2016-09-17 (×2): 1000 mL
  Administered 2016-09-17: 20:00:00
  Administered 2016-09-18 (×2): 1000 mL
  Administered 2016-09-19: 13:00:00
  Administered 2016-09-19: 1000 mL
  Administered 2016-09-20: 11:00:00

## 2016-09-17 MED ORDER — SODIUM CHLORIDE 0.9 % IV SOLN
INTRAVENOUS | Status: DC
  Administered 2016-09-17 – 2016-09-20 (×7): via INTRAVENOUS

## 2016-09-17 MED ORDER — PANTOPRAZOLE SODIUM 40 MG PO PACK
40.0000 mg | PACK | Freq: Every day | ORAL | Status: DC
  Administered 2016-09-17 – 2016-09-20 (×4): 40 mg
  Filled 2016-09-17 (×4): qty 20

## 2016-09-17 MED ORDER — HEPARIN SODIUM (PORCINE) 5000 UNIT/ML IJ SOLN
5000.0000 [IU] | Freq: Three times a day (TID) | INTRAMUSCULAR | Status: DC
  Administered 2016-09-17 – 2016-09-21 (×13): 5000 [IU] via SUBCUTANEOUS
  Filled 2016-09-17 (×13): qty 1

## 2016-09-17 MED ORDER — ETOMIDATE 2 MG/ML IV SOLN
20.0000 mg | Freq: Once | INTRAVENOUS | Status: AC
  Administered 2016-09-17: 20 mg via INTRAVENOUS

## 2016-09-17 MED ORDER — PROPOFOL 1000 MG/100ML IV EMUL
0.0000 ug/kg/min | INTRAVENOUS | Status: DC
  Administered 2016-09-17: 25 ug/kg/min via INTRAVENOUS

## 2016-09-17 MED ORDER — INSULIN ASPART 100 UNIT/ML ~~LOC~~ SOLN
0.0000 [IU] | SUBCUTANEOUS | Status: DC
  Administered 2016-09-17 (×3): 2 [IU] via SUBCUTANEOUS
  Administered 2016-09-17: 3 [IU] via SUBCUTANEOUS
  Administered 2016-09-18: 2 [IU] via SUBCUTANEOUS
  Administered 2016-09-18: 3 [IU] via SUBCUTANEOUS
  Administered 2016-09-18: 2 [IU] via SUBCUTANEOUS
  Administered 2016-09-18 – 2016-09-20 (×8): 3 [IU] via SUBCUTANEOUS
  Administered 2016-09-20: 2 [IU] via SUBCUTANEOUS
  Administered 2016-09-20 (×2): 3 [IU] via SUBCUTANEOUS
  Administered 2016-09-20: 5 [IU] via SUBCUTANEOUS
  Filled 2016-09-17: qty 1

## 2016-09-17 MED ORDER — MAGNESIUM SULFATE 2 GM/50ML IV SOLN
2.0000 g | Freq: Once | INTRAVENOUS | Status: AC
  Administered 2016-09-17: 2 g via INTRAVENOUS
  Filled 2016-09-17: qty 50

## 2016-09-17 MED ORDER — FOLIC ACID 5 MG/ML IJ SOLN
1.0000 mg | Freq: Every day | INTRAMUSCULAR | Status: DC
  Administered 2016-09-17 – 2016-09-20 (×4): 1 mg via INTRAVENOUS
  Filled 2016-09-17 (×6): qty 0.2

## 2016-09-17 MED ORDER — CALCIUM GLUCONATE 10 % IV SOLN
1.0000 g | Freq: Once | INTRAVENOUS | Status: AC
  Administered 2016-09-17: 1 g via INTRAVENOUS
  Filled 2016-09-17: qty 10

## 2016-09-17 MED ORDER — VITAL HIGH PROTEIN PO LIQD: 1000.0000 mL | ORAL | Status: DC

## 2016-09-17 MED ORDER — THIAMINE HCL 100 MG/ML IJ SOLN
100.0000 mg | Freq: Every day | INTRAMUSCULAR | Status: DC
  Administered 2016-09-17 – 2016-09-20 (×4): 100 mg via INTRAVENOUS
  Filled 2016-09-17 (×4): qty 2

## 2016-09-17 MED ORDER — PROPOFOL 1000 MG/100ML IV EMUL
INTRAVENOUS | Status: AC
  Administered 2016-09-17: 30 ug/kg/min via INTRAVENOUS
  Filled 2016-09-17: qty 100

## 2016-09-17 MED ORDER — POTASSIUM CHLORIDE CRYS ER 20 MEQ PO TBCR
20.0000 meq | EXTENDED_RELEASE_TABLET | Freq: Once | ORAL | Status: AC
  Administered 2016-09-17: 20 meq via ORAL
  Filled 2016-09-17: qty 1

## 2016-09-17 MED ORDER — PROPOFOL 1000 MG/100ML IV EMUL
INTRAVENOUS | Status: AC
  Filled 2016-09-17: qty 100

## 2016-09-17 MED ORDER — VECURONIUM BROMIDE 10 MG IV SOLR
10.0000 mg | Freq: Once | INTRAVENOUS | Status: AC
  Administered 2016-09-17: 10 mg via INTRAVENOUS

## 2016-09-17 MED ORDER — PNEUMOCOCCAL VAC POLYVALENT 25 MCG/0.5ML IJ INJ
0.5000 mL | INJECTION | INTRAMUSCULAR | Status: AC
  Administered 2016-09-18: 0.5 mL via INTRAMUSCULAR
  Filled 2016-09-17: qty 0.5

## 2016-09-17 MED ORDER — CHLORHEXIDINE GLUCONATE 0.12% ORAL RINSE (MEDLINE KIT)
15.0000 mL | Freq: Two times a day (BID) | OROMUCOSAL | Status: DC
  Administered 2016-09-17 – 2016-09-20 (×7): 15 mL via OROMUCOSAL

## 2016-09-17 MED ORDER — SUCCINYLCHOLINE CHLORIDE 20 MG/ML IJ SOLN
100.0000 mg | Freq: Once | INTRAMUSCULAR | Status: AC
  Administered 2016-09-17: 100 mg via INTRAVENOUS
  Filled 2016-09-17: qty 5

## 2016-09-17 MED ORDER — PROPOFOL 1000 MG/100ML IV EMUL
0.0000 ug/kg/min | INTRAVENOUS | Status: DC
  Administered 2016-09-17: 50 ug/kg/min via INTRAVENOUS
  Administered 2016-09-17: 30 ug/kg/min via INTRAVENOUS
  Administered 2016-09-17: 04:00:00 via INTRAVENOUS
  Administered 2016-09-17: 25 ug/kg/min via INTRAVENOUS
  Administered 2016-09-17: 45 ug/kg/min via INTRAVENOUS
  Administered 2016-09-18: 30 ug/kg/min via INTRAVENOUS
  Administered 2016-09-18: 40 ug/kg/min via INTRAVENOUS
  Filled 2016-09-17 (×6): qty 100

## 2016-09-17 MED ORDER — MIDAZOLAM HCL 5 MG/ML IJ SOLN
0.0000 mg/h | INTRAMUSCULAR | Status: DC
  Administered 2016-09-18: 6 mg/h via INTRAVENOUS
  Administered 2016-09-18: 8 mg/h via INTRAVENOUS
  Administered 2016-09-18: 10 mg/h via INTRAVENOUS
  Filled 2016-09-17 (×4): qty 10

## 2016-09-17 MED ORDER — SODIUM CHLORIDE 0.9 % IV SOLN
1.0000 mg/h | INTRAVENOUS | Status: DC
  Administered 2016-09-17: 0.5 mg/h via INTRAVENOUS
  Filled 2016-09-17 (×2): qty 10

## 2016-09-17 MED ORDER — SODIUM CHLORIDE 0.9 % IV SOLN: 250.0000 mL | INTRAVENOUS | Status: DC | PRN

## 2016-09-17 MED ORDER — PRO-STAT SUGAR FREE PO LIQD
30.0000 mL | Freq: Two times a day (BID) | ORAL | Status: DC
  Administered 2016-09-17: 30 mL
  Filled 2016-09-17: qty 30

## 2016-09-17 MED ORDER — POTASSIUM CHLORIDE 10 MEQ/100ML IV SOLN: 10.0000 meq | INTRAVENOUS | Status: DC

## 2016-09-17 MED ORDER — SODIUM CHLORIDE 0.9 % IV SOLN
1000.0000 mg | Freq: Two times a day (BID) | INTRAVENOUS | Status: DC
  Administered 2016-09-17 – 2016-09-21 (×8): 1000 mg via INTRAVENOUS
  Filled 2016-09-17 (×10): qty 10

## 2016-09-17 MED ORDER — INFLUENZA VAC SPLIT QUAD 0.5 ML IM SUSY
0.5000 mL | PREFILLED_SYRINGE | INTRAMUSCULAR | Status: AC
  Administered 2016-09-18: 0.5 mL via INTRAMUSCULAR
  Filled 2016-09-17: qty 0.5

## 2016-09-17 MED ORDER — SODIUM CHLORIDE 0.9 % IV SOLN
1500.0000 mg | Freq: Once | INTRAVENOUS | Status: AC
  Administered 2016-09-17: 1500 mg via INTRAVENOUS
  Filled 2016-09-17: qty 15

## 2016-09-17 NOTE — ED Notes (Signed)
Etomidate 20mg  given at 0237.  succ 100mg  given at 0238. Intubated at 0241 24@teeth .

## 2016-09-17 NOTE — Progress Notes (Signed)
eLink Physician-Brief Progress Note Patient Name: Brandon BrennerDavid Raglin DOB: March 18, 1961 MRN: [id number]   Date of Service  09/17/2016  HPI/Events of Note  55 yo with RT side paalysis with facial droop, ETOH level >250, not a code troke Intubated for airway protection  eICU Interventions  Wean sedation, assess neuro status, follow up neuo recs, anti seizure meds, EEg and MRI if needed     Intervention Category Evaluation Type: New Patient Evaluation  Cerissa Zeiger 09/17/2016, 5:00 AM

## 2016-09-17 NOTE — Progress Notes (Signed)
The pt's mom Dalia Heading(Viola Deleon) and sister Larena Glassman(Doris Centrella) were updated at the bedside today.  The pt lives with his mother and she helps care for him.  He has an on/off significant other, "Gina", and his mom does not want her to receive information about her son.  His mother told me he does not see a PCP and she wants him to "get help".  He has been hospitalized several times for seizures and leaves AMA.  She is concerned he will do the same with this admission.  I called Macario GoldsJesse Scinto, CSW and she will follow this case.

## 2016-09-17 NOTE — ED Triage Notes (Signed)
Per EMS:  Pt called communication at 0015 with complaints of 'not feeling well'  Reports pt had slurred speech and paralysis on the right side.  Pt then began seizing.  10mg  valium 2mg  versed given by EMS.  CBG 143. Hx of ETOH and drug use per ems.

## 2016-09-17 NOTE — Progress Notes (Signed)
EEG Completed; Results Pending  

## 2016-09-17 NOTE — Procedures (Signed)
ELECTROENCEPHALOGRAM REPORT  Date of Study: 09/17/2016  Patient's Name: Brandon Silva MRN: [id number] Date of Birth: [birth year]  Referring Provider: Dellia Nims, MD  Clinical History: 55 year old man with seizure disorder presents with recurrent seizures.  Medications:  0.9 % sodium chloride infusion   0.9 % sodium chloride infusion   chlorhexidine gluconate (MEDLINE KIT) (PERIDEX) 0.12 % solution 15 mL   feeding supplement (PRO-STAT SUGAR FREE 64) liquid 30 mL   feeding supplement (VITAL HIGH PROTEIN) liquid 1,000 mL   fentaNYL (SUBLIMAZE) injection 100 mcg   fentaNYL (SUBLIMAZE) injection 397 mcg   folic acid injection 1 mg   heparin injection 5,000 Units   insulin aspart (novoLOG) injection 0-15 Units   levETIRAcetam (KEPPRA) 1,000 mg in sodium chloride 0.9 % 100 mL IVPB   MEDLINE mouth rinse   midazolam (VERSED) 50 mg in sodium chloride 0.9 % 50 mL (1 mg/mL) infusion   pantoprazole sodium (PROTONIX) 40 mg/20 mL oral suspension 40 mg   propofol (DIPRIVAN) 1000 MG/100ML infusion   thiamine (B-1) injection 100 mg  Technical Summary: A multichannel digital EEG recording measured by the international 10-20 system with electrodes applied with paste and impedances below 5000 ohms performed in our laboratory with EKG monitoring in an intubated and sedated patient.  Hyperventilation and photic stimulation were not performed.  The digital EEG was referentially recorded, reformatted, and digitally filtered in a variety of bipolar and referential montages for optimal display.    Description: The patient is intubated and sedated during the recording. There is loss of normal background activity with 3-4 Hz delta and 5-6 Hz theta activity admixed with faster alpha frequencies. There is no spontaneous reactivity or reactivity with stimulation.  Hyperventilation and photic stimulation were not performed. There were no epileptiform discharges or electrographic seizures seen.   EKG lead was  unremarkable.  Impression: This EEG is markedly abnormal due to diffuse background suppression and lack of EEG reactivity with  stimulation.  Clinical Correlation of the above findings indicates severe diffuse cerebral dysfunction likely due to pharmacologic sedation but can also be seen in the setting of anoxic/ischemic injury or toxic/metabolic encephalopathies.  Clinical correlation is advised.  Metta Clines, DO

## 2016-09-17 NOTE — Consult Note (Signed)
Admission H&P    Chief Complaint: Generalized seizures, recurrent.  HPI: Brandon Silva is an 55 y.o. male history of seizure disorder, Todd's paralysis following seizure activity, diabetes mellitus and alcohol abuse, brought to the emergency room and code stroke status after being found on the floor of his home with slurred speech and apparent right-sided weakness. He had witnessed generalized seizure when EMS arrived. He also had a recurrent seizure in the ED. There is a history of poor compliance with anticonvulsant medication. Blood glucose was 1:30. Patient was unresponsive in the ED, including to noxious stimuli. He was intubated for airway protection. He had received a total of 10 mg of Valium and 2 mg of Ativan. He was started on propofol drip after intubation. CT scan of his head showed no acute intracranial abnormality.   Past Medical History:  Diagnosis Date  . Alcohol abuse   . Diabetes mellitus without complication ([place])   . Seizures ([place])     No past surgical history on file.  Family History  Problem Relation Age of Onset  . Diabetes Other    Social History:  reports that he has been smoking Cigarettes.  He does not have any smokeless tobacco history on file. He reports that he drinks alcohol. He reports that he does not use drugs.  Allergies:  Allergies  Allergen Reactions  . Penicillins     Medications: Unavailable.  ROS: Unable to obtain because of patient's unresponsive state.  Physical Examination: There were no vitals taken for this visit.  HEENT-  Normocephalic, no lesions, without obvious abnormality.  Normal external eye and conjunctiva.  Normal TM's bilaterally.  Normal auditory canals and external ears. Normal external nose, mucus membranes and septum.  Normal pharynx. Neck supple with no masses, nodes, nodules or enlargement. Cardiovascular - regular rate and rhythm, S1, S2 normal, no murmur, click, rub or gallop Lungs - chest clear, no wheezing, rales,  normal symmetric air entry Abdomen - soft, non-tender; bowel sounds normal; no masses,  no organomegaly Extremities - no joint deformities, effusion, or inflammation  Neurologic Examination: Patient was unresponsive, including to noxious stimuli. Pupils were equal and reacted normally to light. Extraocular movements were sluggish but conjugate. Face was symmetrical with no focal weakness. Muscle tone was flaccid throughout. Patient had no abnormal posturing and no spontaneous movements. Deep tendon reflexes were hypoactive and symmetrical. Plantar responses were mute.  Results for orders placed or performed during the hospital encounter of 09/17/16 (from the past 48 hour(s))  Ethanol     Status: Abnormal   Collection Time: 09/17/16  2:30 AM  Result Value Ref Range   Alcohol, Ethyl (B) 266 (H) <5 mg/dL    Comment:        LOWEST DETECTABLE LIMIT FOR SERUM ALCOHOL IS 5 mg/dL FOR MEDICAL PURPOSES ONLY   Protime-INR     Status: None   Collection Time: 09/17/16  2:32 AM  Result Value Ref Range   Prothrombin Time 14.9 11.4 - 15.2 seconds   INR 1.16   APTT     Status: None   Collection Time: 09/17/16  2:32 AM  Result Value Ref Range   aPTT 28 24 - 36 seconds  CBC     Status: None   Collection Time: 09/17/16  2:32 AM  Result Value Ref Range   WBC 8.4 4.0 - 10.5 K/uL   RBC 4.92 4.22 - 5.81 MIL/uL   Hemoglobin 15.1 13.0 - 17.0 g/dL   HCT 44.5 39.0 - 52.0 %  MCV 90.4 78.0 - 100.0 fL   MCH 30.7 26.0 - 34.0 pg   MCHC 33.9 30.0 - 36.0 g/dL   RDW 12.9 11.5 - 15.5 %   Platelets 237 150 - 400 K/uL  Differential     Status: None   Collection Time: 09/17/16  2:32 AM  Result Value Ref Range   Neutrophils Relative % 57 %   Neutro Abs 4.8 1.7 - 7.7 K/uL   Lymphocytes Relative 36 %   Lymphs Abs 3.0 0.7 - 4.0 K/uL   Monocytes Relative 5 %   Monocytes Absolute 0.4 0.1 - 1.0 K/uL   Eosinophils Relative 1 %   Eosinophils Absolute 0.1 0.0 - 0.7 K/uL   Basophils Relative 1 %   Basophils  Absolute 0.0 0.0 - 0.1 K/uL  Comprehensive metabolic panel     Status: Abnormal   Collection Time: 09/17/16  2:32 AM  Result Value Ref Range   Sodium 138 135 - 145 mmol/L   Potassium 3.7 3.5 - 5.1 mmol/L   Chloride 105 101 - 111 mmol/L   CO2 21 (L) 22 - 32 mmol/L   Glucose, Bld 130 (H) 65 - 99 mg/dL   BUN 12 6 - 20 mg/dL   Creatinine, Ser 1.31 (H) 0.61 - 1.24 mg/dL   Calcium 9.1 8.9 - 10.3 mg/dL   Total Protein 7.4 6.5 - 8.1 g/dL   Albumin 4.2 3.5 - 5.0 g/dL   AST 31 15 - 41 U/L   ALT 20 17 - 63 U/L   Alkaline Phosphatase 80 38 - 126 U/L   Total Bilirubin 0.7 0.3 - 1.2 mg/dL   GFR calc non Af Amer >60 >60 mL/min   GFR calc Af Amer >60 >60 mL/min    Comment: (NOTE) The eGFR has been calculated using the CKD EPI equation. This calculation has not been validated in all clinical situations. eGFR's persistently <60 mL/min signify possible Chronic Kidney Disease.    Anion gap 12 5 - 15  I-stat troponin, ED (not at [facility], [facility])     Status: None   Collection Time: 09/17/16  2:37 AM  Result Value Ref Range   Troponin i, poc 0.01 0.00 - 0.08 ng/mL   Comment 3            Comment: Due to the release kinetics of cTnI, a negative result within the first hours of the onset of symptoms does not rule out myocardial infarction with certainty. If myocardial infarction is still suspected, repeat the test at appropriate intervals.   I-Stat Chem 8, ED  (not at [facility], [facility])     Status: Abnormal   Collection Time: 09/17/16  2:39 AM  Result Value Ref Range   Sodium 140 135 - 145 mmol/L   Potassium 3.8 3.5 - 5.1 mmol/L   Chloride 105 101 - 111 mmol/L   BUN 13 6 - 20 mg/dL   Creatinine, Ser 1.60 (H) 0.61 - 1.24 mg/dL   Glucose, Bld 130 (H) 65 - 99 mg/dL   Calcium, Ion 1.10 (L) 1.15 - 1.40 mmol/L   TCO2 23 0 - 100 mmol/L   Hemoglobin 15.6 13.0 - 17.0 g/dL   HCT 46.0 39.0 - 52.0 %   Dg Chest Port 1 View  Result Date: 09/17/2016 CLINICAL DATA:  Endotracheal tube placement.  Initial encounter.  EXAM: PORTABLE CHEST 1 VIEW COMPARISON:  Chest radiograph from 03/14/2015 FINDINGS: The patient's endotracheal tube is seen ending 4 cm above the carina. The patient's enteric tube is noted  ending overlying the body of the stomach. The lungs are mildly hypoexpanded. Mild vascular crowding and vascular congestion are seen. No pleural effusion or pneumothorax is identified. The cardiomediastinal silhouette is borderline normal in size. No acute osseous abnormalities are seen. IMPRESSION: 1. Endotracheal tube seen ending 4 cm above the carina. 2. Enteric tube noted ending overlying the body of the stomach. 3. Lungs mildly hypoexpanded but remain grossly clear. Mild vascular congestion noted. Electronically Signed   By: Garald Balding M.D.   On: 09/17/2016 02:58    Assessment/Plan 55 year old man with a history of seizure disorder, diabetes mellitus and alcohol abuse resenting with recurrent seizure activity with multiple witnessed generalized seizures.  Recommendations: 1. Keppra IV 1500 mg loading dose, followed by 1000 mg every 12 hours for maintenance 2. MRI of the brain without contrast to rule out possible acute stroke, as patient has risk factors 3. No indication for emergent EEG, as seizure activity appears to have ceased  We will continue to follow this patient with you.  C.R. Nicole Kindred, MD [facility] [phone]  09/17/2016, 3:22 AM

## 2016-09-17 NOTE — Progress Notes (Signed)
Initial Nutrition Assessment  DOCUMENTATION CODES:   Not applicable  INTERVENTION:    Vital High Protein at 70 ml/h (1680 ml per day) to provide 1680 kcals, 147 gm protein, 1404 ml free water daily.   Total intake with TF + Propofol will be 2208 kcal (100% of estimated needs).  NUTRITION DIAGNOSIS:   Inadequate oral intake related to inability to eat as evidenced by NPO status.  GOAL:   Patient will meet greater than or equal to 90% of their needs  MONITOR:   Vent status, TF tolerance, I & O's, Labs, Weight trends  REASON FOR ASSESSMENT:   Consult Enteral/tube feeding initiation and management  ASSESSMENT:   55 y.o. male admitted 9/20 with seizure activity, possible status.  Required intubation in ED for airway protection.  Discussed patient with RN today. Received MD Consult for TF initiation and management. Nutrition focused physical exam completed.  No muscle or subcutaneous fat depletion noticed. Patient is currently intubated on ventilator support MV: 10.7 L/min Temp (24hrs), Avg:98.4 F (36.9 C), Min:97.4 F (36.3 C), Max:99.3 F (37.4 C)  Propofol: 20 ml/hr providing 528 kcal per day  Labs reviewed. CBG's: [id number] Medications reviewed and include thiamine, folic acid, insulin  Diet Order:  Diet NPO time specified  Skin:  Reviewed, no issues  Last BM:  unknown  Height:   Ht Readings from Last 1 Encounters:  09/17/16 6\' 2"  (1.88 m)    Weight:   Wt Readings from Last 1 Encounters:  09/17/16 216 lb 11.4 oz (98.3 kg)    Ideal Body Weight:  86.4 kg  BMI:  Body mass index is 27.82 kg/m.  Estimated Nutritional Needs:   Kcal:  2195  Protein:  130-150 gm  Fluid:  2.2-2.4 L  EDUCATION NEEDS:   No education needs identified at this time  Joaquin CourtsKimberly Harris, RD, LDN, CNSC Pager [phone] After Hours Pager [phone]

## 2016-09-17 NOTE — ED Provider Notes (Signed)
[facility]-EMERGENCY DEPT Provider Note   CSN: [id number] Arrival date & time: 09/17/16  0229  By signing my name below, I, Freida Busman, attest that this documentation has been prepared under the direction and in the presence of Gilda Crease, MD . Electronically Signed: Freida Busman, Scribe. 09/17/2016. 3:15 AM.  History   Chief Complaint Chief Complaint  Patient presents with  . Seizures   LEVEL 5 CAVEAT DUE TO ACUITY OF MEDICAL CONDITION  The history is provided by the EMS personnel. No language interpreter was used.   HPI Comments:  Brandon Silva is a 55 y.o. male brought in by ambulance, who presents to the Emergency Department for possible stroke/seizure activity this AM. EMS states pt called them ~0015 because he didn't feel right. Upon EMS arrival to the pt, he was on the floor slumped over with slurred speech. EMS believes pt had right sided weakness. Pt began full body seizing en route. He was given 10 mg of valium and 2 mg of versed. EMS notes PMHx of DM, report CBG of 143. They also note pt has ETOH/drug hx.   Past Medical History:  Diagnosis Date  . Alcohol abuse   . Diabetes mellitus without complication (HCC)   . Seizures [facility])     Patient Active Problem List   Diagnosis Date Noted  . Seizures (HCC) 09/17/2016  . Ventilator dependence (HCC)   . Hypocalcemia   . AKI (acute kidney injury) (HCC)   . Acute encephalopathy   . ETOH abuse   . Uncontrolled type 2 diabetes mellitus with ketoacidosis without coma, without long-term current use of insulin (HCC)   . Seizure (HCC) 02/04/2016    History reviewed. No pertinent surgical history.     Home Medications    Prior to Admission medications   Not on File    Family History Family History  Problem Relation Age of Onset  . Diabetes Other     Social History Social History  Substance Use Topics  . Smoking status: Current Every Day Smoker    Types: Cigarettes  . Smokeless tobacco: Not on file  .  Alcohol use Yes     Allergies   Penicillins   Review of Systems Review of Systems  Unable to perform ROS: Acuity of condition     Physical Exam Updated Vital Signs BP 108/74   Pulse 71   Resp 18   SpO2 96%   Physical Exam  Constitutional: He appears well-developed and well-nourished.  HENT:  Head: Normocephalic and atraumatic.  Right Ear: Hearing normal.  Left Ear: Hearing normal.  Nose: Nose normal.  Mouth/Throat: Oropharynx is clear and moist and mucous membranes are normal.  Bites to tongue with bleeding  Eyes: Conjunctivae and EOM are normal. Pupils are equal, round, and reactive to light.  Neck: Neck supple.  Cardiovascular: Regular rhythm, S1 normal and S2 normal.  Exam reveals no gallop and no friction rub.   No murmur heard. Pulmonary/Chest: Effort normal and breath sounds normal. No respiratory distress. He exhibits no tenderness.  Abdominal: Soft. Normal appearance and bowel sounds are normal. There is no hepatosplenomegaly. There is no tenderness. There is no rebound, no guarding, no tenderness at McBurney's point and negative Murphy's sign. No hernia.  Musculoskeletal: He exhibits no edema.  Neurological: GCS eye subscore is 1. GCS verbal subscore is 1. GCS motor subscore is 1.  Unresponsive  Skin: Skin is warm, dry and intact. No rash noted. No cyanosis.  Nursing note and vitals reviewed.  ED Treatments / Results  DIAGNOSTIC STUDIES:  Oxygen Saturation is 100% on ETT, normal by my interpretation.    Labs (all labs ordered are listed, but only abnormal results are displayed) Labs Reviewed  ETHANOL - Abnormal; Notable for the following:       Result Value   Alcohol, Ethyl (B) 266 (*)    All other components within normal limits  COMPREHENSIVE METABOLIC PANEL - Abnormal; Notable for the following:    CO2 21 (*)    Glucose, Bld 130 (*)    Creatinine, Ser 1.31 (*)    All other components within normal limits  I-STAT CHEM 8, ED - Abnormal; Notable  for the following:    Creatinine, Ser 1.60 (*)    Glucose, Bld 130 (*)    Calcium, Ion 1.10 (*)    All other components within normal limits  PROTIME-INR  APTT  CBC  DIFFERENTIAL  CBC  URINE RAPID DRUG SCREEN, HOSP PERFORMED  BASIC METABOLIC PANEL  BLOOD GAS, ARTERIAL  MAGNESIUM  PHOSPHORUS  LACTIC ACID, PLASMA  LACTIC ACID, PLASMA  TROPONIN I  TROPONIN I  TROPONIN I  TRIGLYCERIDES  ACETAMINOPHEN LEVEL  SALICYLATE LEVEL  I-STAT TROPOININ, ED    EKG  EKG Interpretation None       Radiology Ct Cervical Spine Wo Contrast  Result Date: 09/17/2016 CLINICAL DATA:  Code stroke. Initial evaluation for slurred speech, right-sided paralysis. Seizure. EXAM: CT HEAD WITHOUT CONTRAST CT CERVICAL SPINE WITHOUT CONTRAST TECHNIQUE: Multidetector CT imaging of the head and cervical spine was performed following the standard protocol without intravenous contrast. Multiplanar CT image reconstructions of the cervical spine were also generated. COMPARISON:  None. FINDINGS: CT HEAD FINDINGS Brain: Cerebral volume normal. Mild chronic microvascular ischemic changes present. No acute intracranial hemorrhage. No evidence for acute large vessel territory infarct. No mass lesion, midline shift, or mass effect. No hydrocephalus. No extra-axial fluid collection. Vascular: No hyperdense vessel. Skull: Scalp soft tissues within normal limits.  Calvarium intact. Sinuses/Orbits: The globes and orbits within normal limits. Mild scattered mucosal thickening within the ethmoidal air cells. Visualized paranasal sinuses are otherwise clear. No mastoid effusion. Other: Study somewhat limited as the posterior fossa is incompletely evaluated on this exam. ASPECTS (Alberta Stroke Program Early CT Score) - Ganglionic level infarction (caudate, lentiform nuclei, internal capsule, insula, M1-M3 cortex): 7 - Supraganglionic infarction (M4-M6 cortex): 3 Total score (0-10 with 10 being normal): 10 CT CERVICAL SPINE FINDINGS  Alignment: Vertebral bodies normally aligned with preservation of the normal cervical lordosis. No listhesis. Skull base and vertebrae: Vertebral body heights maintained. No acute fracture. Soft tissues and spinal canal: Paraspinous soft tissues demonstrate no acute abnormality. Patient is intubated. 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 Upper chest: Visualized lungs are clear without acute abnormality. IMPRESSION: 1. No acute intracranial infarct or other process identified. 2. Aspects is 10 3. No acute traumatic injury within the cervical spine. 4. 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 Critical Value/emergent results were called by telephone at the time of interpretation on 09/17/2016 at 4:00 am to Dr. Jaci Carrel , who verbally acknowledged these results. Electronically Signed   By: Rise Mu M.D.   On: 09/17/2016 04:01   Dg Chest Port 1 View  Result Date: 09/17/2016 CLINICAL DATA:  Endotracheal tube placement.  Initial encounter. EXAM: PORTABLE CHEST 1 VIEW COMPARISON:  Chest radiograph from 03/14/2015 FINDINGS: The patient's endotracheal tube is seen ending 4 cm above the carina. The patient's enteric tube is noted ending overlying the  body of the stomach. The lungs are mildly hypoexpanded. Mild vascular crowding and vascular congestion are seen. No pleural effusion or pneumothorax is identified. The cardiomediastinal silhouette is borderline normal in size. No acute osseous abnormalities are seen. IMPRESSION: 1. Endotracheal tube seen ending 4 cm above the carina. 2. Enteric tube noted ending overlying the body of the stomach. 3. Lungs mildly hypoexpanded but remain grossly clear. Mild vascular congestion noted. Electronically Signed   By: Roanna RaiderJeffery  Chang M.D.   On: 09/17/2016 02:58   Ct Head Code Stroke W/o Cm  Result Date: 09/17/2016 CLINICAL DATA:  Code stroke. Initial evaluation for slurred speech, right-sided paralysis. Seizure. EXAM: CT HEAD  WITHOUT CONTRAST CT CERVICAL SPINE WITHOUT CONTRAST TECHNIQUE: Multidetector CT imaging of the head and cervical spine was performed following the standard protocol without intravenous contrast. Multiplanar CT image reconstructions of the cervical spine were also generated. COMPARISON:  None. FINDINGS: CT HEAD FINDINGS Brain: Cerebral volume normal. Mild chronic microvascular ischemic changes present. No acute intracranial hemorrhage. No evidence for acute large vessel territory infarct. No mass lesion, midline shift, or mass effect. No hydrocephalus. No extra-axial fluid collection. Vascular: No hyperdense vessel. Skull: Scalp soft tissues within normal limits.  Calvarium intact. Sinuses/Orbits: The globes and orbits within normal limits. Mild scattered mucosal thickening within the ethmoidal air cells. Visualized paranasal sinuses are otherwise clear. No mastoid effusion. Other: Study somewhat limited as the posterior fossa is incompletely evaluated on this exam. ASPECTS (Alberta Stroke Program Early CT Score) - Ganglionic level infarction (caudate, lentiform nuclei, internal capsule, insula, M1-M3 cortex): 7 - Supraganglionic infarction (M4-M6 cortex): 3 Total score (0-10 with 10 being normal): 10 CT CERVICAL SPINE FINDINGS Alignment: Vertebral bodies normally aligned with preservation of the normal cervical lordosis. No listhesis. Skull base and vertebrae: Vertebral body heights maintained. No acute fracture. Soft tissues and spinal canal: Paraspinous soft tissues demonstrate no acute abnormality. Patient is intubated. 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 Upper chest: Visualized lungs are clear without acute abnormality. IMPRESSION: 1. No acute intracranial infarct or other process identified. 2. Aspects is 10 3. No acute traumatic injury within the cervical spine. 4. 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 Critical Value/emergent results were called by telephone at the time of  interpretation on 09/17/2016 at 4:00 am to Dr. Jaci CarrelHRISTOPHER POLLINA , who verbally acknowledged these results. Electronically Signed   By: Rise MuBenjamin  McClintock M.D.   On: 09/17/2016 04:01    Procedures .Intubation Date/Time: 09/17/2016 6:01 AM Performed by: Gilda CreasePOLLINA, CHRISTOPHER J Authorized by: Gilda CreasePOLLINA, CHRISTOPHER J   Consent:    Consent obtained:  Emergent situation Pre-procedure details:    Patient status:  Unresponsive   Pretreatment medications:  None   Paralytics:  Succinylcholine Procedure details:    Preoxygenation:  Nonrebreather mask   CPR in progress: no     Intubation method:  Oral   Oral intubation technique:  Video-assisted   Tube size (mm):  7.5   Tube type:  Cuffed   Number of attempts:  2   Ventilation between attempts: yes     Cricoid pressure: yes     Tube visualized through cords: yes   Placement assessment:    ETT to teeth:  24   Tube secured with:  ETT holder   Breath sounds:  Equal   Placement verification: chest rise, condensation and equal breath sounds     CXR findings:  ETT in proper place Post-procedure details:    Patient tolerance of procedure:  Tolerated well, no immediate  complications Comments:     Initial attempt unsuccessful because of large and floppy epiglottis. Had to change from a #3 to a #4 glide scope blade to facilitate intubation       CRITICAL CARE Performed by: Gilda Crease, MD Total critical care time: 30 minutes Critical care time was exclusive of separately billable procedures and treating other patients. Critical care was necessary to treat or prevent imminent or life-threatening deterioration. Critical care was time spent personally by me on the following activities: development of treatment plan with patient and/or surrogate as well as nursing, discussions with consultants, evaluation of patient's response to treatment, examination of patient, obtaining history from patient or surrogate, ordering and performing  treatments and interventions, ordering and review of laboratory studies, ordering and review of radiographic studies, pulse oximetry and re-evaluation of patient's condition.   Medications Ordered in ED Medications  levETIRAcetam (KEPPRA) 1,000 mg in sodium chloride 0.9 % 100 mL IVPB (not administered)  calcium gluconate 1 g in sodium chloride 0.9 % 100 mL IVPB (1 g Intravenous New Bag/Given 09/17/16 0520)  thiamine (B-1) injection 100 mg (not administered)  folic acid injection 1 mg (not administered)  heparin injection 5,000 Units (not administered)  0.9 %  sodium chloride infusion ( Intravenous New Bag/Given 09/17/16 0452)  0.9 %  sodium chloride infusion (not administered)  pantoprazole sodium (PROTONIX) 40 mg/20 mL oral suspension 40 mg (not administered)  propofol (DIPRIVAN) 1000 MG/100ML infusion (25 mcg/kg/min Intravenous New Bag/Given 09/17/16 0451)  fentaNYL (SUBLIMAZE) injection 100 mcg (not administered)  fentaNYL (SUBLIMAZE) injection 100 mcg (not administered)  insulin aspart (novoLOG) injection 0-15 Units (not administered)  etomidate (AMIDATE) injection 20 mg (20 mg Intravenous Given 09/17/16 0237)  succinylcholine (ANECTINE) injection 100 mg (100 mg Intravenous Given 09/17/16 0238)  propofol (DIPRIVAN) 1000 MG/100ML infusion (  Stopped 09/17/16 0451)  levETIRAcetam (KEPPRA) 1,500 mg in sodium chloride 0.9 % 100 mL IVPB (0 mg Intravenous Stopped 09/17/16 0415)  vecuronium (NORCURON) injection 10 mg (10 mg Intravenous Given 09/17/16 0322)     Initial Impression / Assessment and Plan / ED Course  I have reviewed the triage vital signs and the nursing notes.  Pertinent labs & imaging results that were available during my care of the patient were reviewed by me and considered in my medical decision making (see chart for details).  Clinical Course   Patient presents to the emergency department for evaluation of possible stroke and seizure. Patient initially called EMS himself  stating that he was not feeling well. By the time EMS arrived on the scene he was experiencing right sided paralysis, facial droop and slurred speech. EMS was preparing to transport him when he became unresponsive with generalized tonic-clonic seizure activity. He was given multiple doses of benzodiazepine and had prolonged seizure activity. After his last dose of Versed, seizure stopped. Upon arrival to the ER patient was completely unresponsive. His GCS was 3. Postictal state and sedation from medication was considered likely, however, with his right sided paralysis prior to the seizure, devastating neurologic process was considered, such as significant intracranial bleed. He therefore was intubated to facilitate workup.  Screening CT scan did not show any acute abnormality. Patient did start to become agitated during the workup and was noted to move all 4 extremities but did not regain complete consciousness. He was given additional sedation. Patient was given Keppra IV for his seizures. He does have a history of alcoholism, but alcohol level was 266. He is therefore unlikely experiencing withdrawal  seizures. Patient will require further evaluation in the ICU.  Final Clinical Impressions(s) / ED Diagnoses   Final diagnoses:  Seizure [facility])    New Prescriptions New Prescriptions   No medications on file   I personally performed the services described in this documentation, which was scribed in my presence. The recorded information has been reviewed and is accurate.     Gilda Crease, MD 09/17/16 [id number]

## 2016-09-17 NOTE — H&P (Addendum)
PULMONARY / CRITICAL CARE MEDICINE   Name: Brandon Silva MRN: [id number] DOB: Nov 19, 1961    ADMISSION DATE:  09/17/2016 CONSULTATION DATE:  09/17/16  REFERRING MD:  Roseanne Reno - Neuro  CHIEF COMPLAINT:  Seizures  HISTORY OF PRESENT ILLNESS:  Pt is encephelopathic; therefore, this HPI is obtained from chart review. Brandon Silva is a 55 y.o. male with PMH as outlined below. He was brought to [facility] ED 9/20 with seizure activity.  He apparently called EMS just after midnight and stated that he did not feel well.  On EMS arrival, pt was on the floor of his home with slurred speech and reported right sided weakness.  En route to ED, he had witnessed seizure that lasted several minutes.  He had additional seizure activity after arrival to [facility] ED.  He was treated with midazolam, diazepam en route to ED, and once in ED, received lorazepam, propofol, and keppra.  He was then intubated for airway protection.  He reportedly has hx of poor compliance with anticonvulsant medication and also has hx of chronic ETOH use.  CT scan of the head was negative.  PAST MEDICAL HISTORY :  He  has a past medical history of Alcohol abuse; Diabetes mellitus without complication (HCC); and Seizures (HCC).  PAST SURGICAL HISTORY: He  has no past surgical history on file.  Allergies  Allergen Reactions  . Penicillins     No current facility-administered medications on file prior to encounter.    No current outpatient prescriptions on file prior to encounter.    FAMILY HISTORY:  His has no family status information on file.    SOCIAL HISTORY: He  reports that he has been smoking Cigarettes.  He does not have any smokeless tobacco history on file. He reports that he drinks alcohol. He reports that he does not use drugs.  REVIEW OF SYSTEMS:   Not able to obtain due to pt being intubated.  SUBJECTIVE:  On vent, unresponsive.  VITAL SIGNS: BP 106/76   Pulse 73   Resp 18   SpO2 100%   HEMODYNAMICS:     VENTILATOR SETTINGS: Vent Mode: PRVC FiO2 (%):  [100 %] 100 % Set Rate:  [18 bmp] 18 bmp Vt Set:  [620 mL] 620 mL PEEP:  [5 cmH20] 5 cmH20 Plateau Pressure:  [16 cmH20] 16 cmH20  INTAKE / OUTPUT: No intake/output data recorded.   PHYSICAL EXAMINATION: General: Middle aged male, appears older than stated age, in NAD. Neuro: Sedated, does not follow commands. HEENT: [place]/AT. PERRL, exophthalmos, sclerae anicteric. Cardiovascular: RRR, no M/R/G.  Lungs: Respirations even and unlabored.  CTA bilaterally, No W/R/R. Abdomen: BS x 4, soft, NT/ND.  Musculoskeletal: No gross deformities, no edema.  Skin: Intact, warm, no rashes.  LABS:  BMET  Recent Labs Lab 09/17/16 0232 09/17/16 0239  NA 138 140  K 3.7 3.8  CL 105 105  CO2 21*  --   BUN 12 13  CREATININE 1.31* 1.60*  GLUCOSE 130* 130*    Electrolytes  Recent Labs Lab 09/17/16 0232  CALCIUM 9.1    CBC  Recent Labs Lab 09/17/16 0232 09/17/16 0239  WBC 8.4  --   HGB 15.1 15.6  HCT 44.5 46.0  PLT 237  --     Coag's  Recent Labs Lab 09/17/16 0232  APTT 28  INR 1.16    Sepsis Markers No results for input(s): LATICACIDVEN, PROCALCITON, O2SATVEN in the last 168 hours.  ABG No results for input(s): PHART, PCO2ART, PO2ART in the last 168  hours.  Liver Enzymes  Recent Labs Lab 09/17/16 0232  AST 31  ALT 20  ALKPHOS 80  BILITOT 0.7  ALBUMIN 4.2    Cardiac Enzymes No results for input(s): TROPONINI, PROBNP in the last 168 hours.  Glucose No results for input(s): GLUCAP in the last 168 hours.  Imaging Ct Cervical Spine Wo Contrast  Result Date: 09/17/2016 CLINICAL DATA:  Code stroke. Initial evaluation for slurred speech, right-sided paralysis. Seizure. EXAM: CT HEAD WITHOUT CONTRAST CT CERVICAL SPINE WITHOUT CONTRAST TECHNIQUE: Multidetector CT imaging of the head and cervical spine was performed following the standard protocol without intravenous contrast. Multiplanar CT image  reconstructions of the cervical spine were also generated. COMPARISON:  None. FINDINGS: CT HEAD FINDINGS Brain: Cerebral volume normal. Mild chronic microvascular ischemic changes present. No acute intracranial hemorrhage. No evidence for acute large vessel territory infarct. No mass lesion, midline shift, or mass effect. No hydrocephalus. No extra-axial fluid collection. Vascular: No hyperdense vessel. Skull: Scalp soft tissues within normal limits.  Calvarium intact. Sinuses/Orbits: The globes and orbits within normal limits. Mild scattered mucosal thickening within the ethmoidal air cells. Visualized paranasal sinuses are otherwise clear. No mastoid effusion. Other: Study somewhat limited as the posterior fossa is incompletely evaluated on this exam. ASPECTS (Alberta Stroke Program Early CT Score) - Ganglionic level infarction (caudate, lentiform nuclei, internal capsule, insula, M1-M3 cortex): 7 - Supraganglionic infarction (M4-M6 cortex): 3 Total score (0-10 with 10 being normal): 10 CT CERVICAL SPINE FINDINGS Alignment: Vertebral bodies normally aligned with preservation of the normal cervical lordosis. No listhesis. Skull base and vertebrae: Vertebral body heights maintained. No acute fracture. Soft tissues and spinal canal: Paraspinous soft tissues demonstrate no acute abnormality. Patient is intubated. 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 Upper chest: Visualized lungs are clear without acute abnormality. IMPRESSION: 1. No acute intracranial infarct or other process identified. 2. Aspects is 10 3. No acute traumatic injury within the cervical spine. 4. 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 Critical Value/emergent results were called by telephone at the time of interpretation on 09/17/2016 at 4:00 am to Dr. Jaci Carrel , who verbally acknowledged these results. Electronically Signed   By: Rise Mu M.D.   On: 09/17/2016 04:01   Dg Chest Port 1  View  Result Date: 09/17/2016 CLINICAL DATA:  Endotracheal tube placement.  Initial encounter. EXAM: PORTABLE CHEST 1 VIEW COMPARISON:  Chest radiograph from 03/14/2015 FINDINGS: The patient's endotracheal tube is seen ending 4 cm above the carina. The patient's enteric tube is noted ending overlying the body of the stomach. The lungs are mildly hypoexpanded. Mild vascular crowding and vascular congestion are seen. No pleural effusion or pneumothorax is identified. The cardiomediastinal silhouette is borderline normal in size. No acute osseous abnormalities are seen. IMPRESSION: 1. Endotracheal tube seen ending 4 cm above the carina. 2. Enteric tube noted ending overlying the body of the stomach. 3. Lungs mildly hypoexpanded but remain grossly clear. Mild vascular congestion noted. Electronically Signed   By: Roanna Raider M.D.   On: 09/17/2016 02:58   Ct Head Code Stroke W/o Cm  Result Date: 09/17/2016 CLINICAL DATA:  Code stroke. Initial evaluation for slurred speech, right-sided paralysis. Seizure. EXAM: CT HEAD WITHOUT CONTRAST CT CERVICAL SPINE WITHOUT CONTRAST TECHNIQUE: Multidetector CT imaging of the head and cervical spine was performed following the standard protocol without intravenous contrast. Multiplanar CT image reconstructions of the cervical spine were also generated. COMPARISON:  None. FINDINGS: CT HEAD FINDINGS Brain: Cerebral volume normal. Mild chronic microvascular  ischemic changes present. No acute intracranial hemorrhage. No evidence for acute large vessel territory infarct. No mass lesion, midline shift, or mass effect. No hydrocephalus. No extra-axial fluid collection. Vascular: No hyperdense vessel. Skull: Scalp soft tissues within normal limits.  Calvarium intact. Sinuses/Orbits: The globes and orbits within normal limits. Mild scattered mucosal thickening within the ethmoidal air cells. Visualized paranasal sinuses are otherwise clear. No mastoid effusion. Other: Study somewhat  limited as the posterior fossa is incompletely evaluated on this exam. ASPECTS (Alberta Stroke Program Early CT Score) - Ganglionic level infarction (caudate, lentiform nuclei, internal capsule, insula, M1-M3 cortex): 7 - Supraganglionic infarction (M4-M6 cortex): 3 Total score (0-10 with 10 being normal): 10 CT CERVICAL SPINE FINDINGS Alignment: Vertebral bodies normally aligned with preservation of the normal cervical lordosis. No listhesis. Skull base and vertebrae: Vertebral body heights maintained. No acute fracture. Soft tissues and spinal canal: Paraspinous soft tissues demonstrate no acute abnormality. Patient is intubated. 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 Upper chest: Visualized lungs are clear without acute abnormality. IMPRESSION: 1. No acute intracranial infarct or other process identified. 2. Aspects is 10 3. No acute traumatic injury within the cervical spine. 4. 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 Critical Value/emergent results were called by telephone at the time of interpretation on 09/17/2016 at 4:00 am to Dr. Jaci CarrelHRISTOPHER POLLINA , who verbally acknowledged these results. Electronically Signed   By: Rise MuBenjamin  McClintock M.D.   On: 09/17/2016 04:01     STUDIES:  CT head 9/20 > negative.  CULTURES: None.  ANTIBIOTICS: None.  SIGNIFICANT EVENTS: 9/20 > admitted with seizure activity, required intubation for airway protection.  LINES/TUBES: ETT 9/20 >  DISCUSSION: 55 y.o. male admitted 9/20 with seizure activity, possible status.  Required intubation in ED for airway protection.  ASSESSMENT / PLAN:  NEUROLOGIC A:   Acute encephalopathy. Seizure activity - pt reportedly has poor compliance with AED's. Hx ETOH use. P:   Sedation:  Propofol gtt / Fentanyl PRN. RASS goal: 0 to -1. Daily WUA. AED's per neuro. Neuro following. Thiamine, Folate.  PULMONARY A: VDRF - due to inability to protect the airway in the setting of  seizure activity. P:   Full vent support. Wean as able. VAP prevention measures. SBT in AM if able. CXR in AM.  CARDIOVASCULAR A:  No acute issues. P:  Monitor hemodynamics. Trend lactate, troponin.  RENAL A:   AKI. Hypocalcemia. P:   NS @ 100. 1g Ca gluconate. BMP in AM.  GASTROINTESTINAL A:   GI prophylaxis. Nutrition. P:   SUP: Pantoprazole. NPO.  HEMATOLOGIC A:   VTE Prophylaxis. P:  SCD's / Heparin. CBC in AM.  INFECTIOUS A:   No indication of infection. P:   Monitor clinically.  ENDOCRINE A:   DM. P:   SSI.   Family updated: None available.  Interdisciplinary Family Meeting v Palliative Care Meeting:  Due by: 9/27.  CC time: 30 minutes.   Rutherford Guysahul Desai, [place] Sidonie Dickens- [facility] Pager: [phone]  or [phone] 09/17/2016, 4:44 AM  STAFF NOTE: I, Rory Percyaniel Feinstein, MD FACP have personally reviewed patient's available data, including medical history, events of note, physical examination and test results as part of my evaluation. I have discussed with resident/NP and other care providers such as pharmacist, RN and RRT. In addition, I personally evaluated patient and elicited key findings of: alert, agitation evident, coarse BS, no asp infiltrate on pcxr, no further seizures on examination,  keppra, eeg, CT reviewed, keep collar until able to clear clinically start feeds, get lft follow up , neuro consulted, add versed drip to prop, failing mono prop, mag and k supp, keep same MV on vent, if able couls consider SBT, no extubation planned today The patient is critically ill with multiple organ systems failure and requires high complexity decision making for assessment and support, frequent evaluation and titration of therapies, application of advanced monitoring technologies and extensive interpretation of multiple databases, LActic acid from seizure, no repeat needed   The patient is noted to have a lactate>4.  With the current information available to me, I don't think the patient is in septic shock. The lactate>4, is related to seizure activity.   Critical Care Time devoted to patient care services described in this note is 35  Minutes. This time reflects time of care of this signee: Rory Percy, MD FACP. This critical care time does not reflect procedure time, or teaching time or supervisory time of PA/NP/Med student/Med Resident etc but could involve care discussion time. Rest per NP/medical resident whose note is outlined above and that I agree with   Mcarthur Rossetti. Tyson Alias, MD, FACP Pgr: [phone] [place] [facility] 09/17/2016 9:34 AM

## 2016-09-17 NOTE — ED Notes (Signed)
Pt CBG is 126, nurse notified.

## 2016-09-17 NOTE — ED Notes (Signed)
While in CT scan, pt began fighting the vent, moving all 4 extremities and attempting to get hands up to head.

## 2016-09-17 NOTE — Procedures (Signed)
Intubation Procedure Note Brandon BrennerDavid Silva [id number] 11-Jun-1961  Procedure: Intubation Indications: Airway protection and maintenance  Procedure Details Consent: Risks of procedure as well as the alternatives and risks of each were explained to the (patient/caregiver).  Consent for procedure obtained. Time Out: Verified patient identification, verified procedure, site/side was marked, verified correct patient position, special equipment/implants available, medications/allergies/relevent history reviewed, required imaging and test results available.  Performed  Maximum sterile technique was used including gloves.  MAC    Evaluation Hemodynamic Status: BP stable throughout; O2 sats: stable throughout Patient's Current Condition: stable Complications: No apparent complications Patient did tolerate procedure well. Chest X-ray ordered to verify placement.  CXR: pending.   Brandon ChestnutReid, Brandon Silva [facility] 09/17/2016

## 2016-09-17 NOTE — Progress Notes (Signed)
Pt transported from ED to [facility] ICU without incident.

## 2016-09-18 ENCOUNTER — Inpatient Hospital Stay (HOSPITAL_COMMUNITY): Payer: Self-pay

## 2016-09-18 ENCOUNTER — Encounter (HOSPITAL_COMMUNITY): Payer: Self-pay | Admitting: *Deleted

## 2016-09-18 DIAGNOSIS — N179 Acute kidney failure, unspecified: Secondary | ICD-10-CM

## 2016-09-18 LAB — GLUCOSE, CAPILLARY
GLUCOSE-CAPILLARY: 137 mg/dL — AB (ref 65–99)
GLUCOSE-CAPILLARY: 140 mg/dL — AB (ref 65–99)
GLUCOSE-CAPILLARY: 120 mg/dL — AB (ref 65–99)
GLUCOSE-CAPILLARY: 153 mg/dL — AB (ref 65–99)
Glucose-Capillary: 155 mg/dL — ABNORMAL HIGH (ref 65–99)
Glucose-Capillary: 154 mg/dL — ABNORMAL HIGH (ref 65–99)

## 2016-09-18 LAB — COMPREHENSIVE METABOLIC PANEL
ALBUMIN: 3.2 g/dL — AB (ref 3.5–5.0)
ALT: 17 U/L (ref 17–63)
AST: 24 U/L (ref 15–41)
Alkaline Phosphatase: 76 U/L (ref 38–126)
Anion gap: 8 (ref 5–15)
BUN: 13 mg/dL (ref 6–20)
CALCIUM: 8.4 mg/dL — AB (ref 8.9–10.3)
CO2: 23 mmol/L (ref 22–32)
CREATININE: 1.12 mg/dL (ref 0.61–1.24)
Chloride: 107 mmol/L (ref 101–111)
GFR calc Af Amer: >60 mL/min (ref >60)
GFR calc non Af Amer: >60 mL/min (ref >60)
Glucose, Bld: 130 mg/dL — ABNORMAL HIGH (ref 65–99)
POTASSIUM: 3.8 mmol/L (ref 3.5–5.1)
SODIUM: 138 mmol/L (ref 135–145)
TOTAL PROTEIN: 6.1 g/dL — AB (ref 6.5–8.1)
Total Bilirubin: 0.9 mg/dL (ref 0.3–1.2)

## 2016-09-18 LAB — PHOSPHORUS
PHOSPHORUS: 2.8 mg/dL (ref 2.5–4.6)
Phosphorus: 2.2 mg/dL — ABNORMAL LOW (ref 2.5–4.6)

## 2016-09-18 LAB — CBC
HEMATOCRIT: 39.1 % (ref 39.0–52.0)
HEMOGLOBIN: 13.1 g/dL (ref 13.0–17.0)
MCH: 30.9 pg (ref 26.0–34.0)
MCHC: 33.5 g/dL (ref 30.0–36.0)
MCV: 92.2 fL (ref 78.0–100.0)
Platelets: 203 10*3/uL (ref 150–400)
RBC: 4.24 MIL/uL (ref 4.22–5.81)
RDW: 13.2 % (ref 11.5–15.5)
WBC: 6.8 10*3/uL (ref 4.0–10.5)

## 2016-09-18 LAB — MAGNESIUM
MAGNESIUM: 2.2 mg/dL (ref 1.7–2.4)
MAGNESIUM: 2.1 mg/dL (ref 1.7–2.4)

## 2016-09-18 MED ORDER — LORAZEPAM 2 MG/ML IJ SOLN: 1.0000 mg | INTRAMUSCULAR | Status: DC | PRN

## 2016-09-18 MED ORDER — MIDAZOLAM HCL 5 MG/ML IJ SOLN
0.0000 mg/h | INTRAMUSCULAR | Status: DC
  Administered 2016-09-18 – 2016-09-20 (×4): 10 mg/h via INTRAVENOUS
  Filled 2016-09-18 (×5): qty 20

## 2016-09-18 MED ORDER — FENTANYL CITRATE (PF) 100 MCG/2ML IJ SOLN
25.0000 ug | INTRAMUSCULAR | Status: DC | PRN
Start: 2016-09-18 — End: 2016-09-21
  Administered 2016-09-18 – 2016-09-20 (×9): 100 ug via INTRAVENOUS
  Filled 2016-09-18 (×9): qty 2

## 2016-09-18 MED ORDER — MIDAZOLAM BOLUS VIA INFUSION
1.0000 mg | INTRAVENOUS | Status: DC | PRN
  Administered 2016-09-19: 1 mg via INTRAVENOUS
  Administered 2016-09-19: 4 mg via INTRAVENOUS
  Administered 2016-09-19: 1 mg via INTRAVENOUS
  Administered 2016-09-19 – 2016-09-20 (×2): 4 mg via INTRAVENOUS
  Filled 2016-09-18 (×2): qty 4

## 2016-09-18 MED ORDER — LORAZEPAM 2 MG/ML IJ SOLN
4.0000 mg | Freq: Once | INTRAMUSCULAR | Status: AC
  Administered 2016-09-18: 4 mg via INTRAVENOUS

## 2016-09-18 MED ORDER — DEXMEDETOMIDINE HCL IN NACL 400 MCG/100ML IV SOLN
0.4000 ug/kg/h | INTRAVENOUS | Status: DC
  Administered 2016-09-18: 0.6 ug/kg/h via INTRAVENOUS
  Administered 2016-09-18: 0.5 ug/kg/h via INTRAVENOUS
  Administered 2016-09-18: 0.6 ug/kg/h via INTRAVENOUS
  Administered 2016-09-19: 0.5 ug/kg/h via INTRAVENOUS
  Administered 2016-09-19 (×2): 1 ug/kg/h via INTRAVENOUS
  Administered 2016-09-19: 1.1 ug/kg/h via INTRAVENOUS
  Administered 2016-09-20: 0.5 ug/kg/h via INTRAVENOUS
  Administered 2016-09-20: 1.1 ug/kg/h via INTRAVENOUS
  Administered 2016-09-20: 1.5 ug/kg/h via INTRAVENOUS
  Administered 2016-09-20 (×2): 1.1 ug/kg/h via INTRAVENOUS
  Administered 2016-09-20: 1.4 ug/kg/h via INTRAVENOUS
  Administered 2016-09-21 (×4): 1.7 ug/kg/h via INTRAVENOUS
  Filled 2016-09-18: qty 50
  Filled 2016-09-18 (×9): qty 100
  Filled 2016-09-18: qty 50
  Filled 2016-09-18 (×8): qty 100

## 2016-09-18 MED ORDER — LORAZEPAM 2 MG/ML IJ SOLN
2.0000 mg | INTRAMUSCULAR | Status: DC | PRN
  Filled 2016-09-18 (×2): qty 1

## 2016-09-18 NOTE — Care Management Note (Signed)
Case Management Note  Patient Details  Name: Brandon BrennerDavid Silva MRN: [id number] Date of Birth: 04-Oct-1961  Subjective/Objective:  Pt admitted on 09/17/16 with seizures, VDRF.  PTA, pt independent, lives with mother.                    Action/Plan: Pt remains intubated; will follow for discharge planning as pt progresses.    Expected Discharge Date:                  Expected Discharge Plan:  Home/Self Care  In-House Referral:  Clinical Social Work  Discharge planning Services  CM Consult  Post Acute Care Choice:    Choice offered to:     DME Arranged:    DME Agency:     HH Arranged:    HH Agency:     Status of Service:  In process, will continue to follow  If discussed at Long Length of Stay Meetings, dates discussed:    Additional Comments:  Quintella BatonJulie W. Dillan Candela, RN, BSN  Trauma/Neuro ICU Case Manager [phone]

## 2016-09-18 NOTE — Progress Notes (Signed)
Pt tense and shaking, appears to be seizing. CCM notified, new order for Ativan placed. Will continue to monitor.

## 2016-09-18 NOTE — Progress Notes (Signed)
eLink Physician-Brief Progress Note Patient Name: Brandon BrennerDavid Silva DOB: 1961-10-11 MRN: [id number]   Date of Service  09/18/2016  HPI/Events of Note  Agitation - Already on a Versed IV infusion at 10  Mg/hour, Fentanyl IV PRN and Thiamine IV  eICU Interventions  Will order: 1. Precedex IV infusion. Titrate to RASS = 0 to -1.     Intervention Category Minor Interventions: Agitation / anxiety - evaluation and management  Sommer,Steven Eugene 09/18/2016, 3:20 PM

## 2016-09-18 NOTE — Progress Notes (Signed)
PULMONARY / CRITICAL CARE MEDICINE   Name: Brandon Silva MRN: [id number] DOB: [birth year]    ADMISSION DATE:  09/17/2016 CONSULTATION DATE:  09/17/16  REFERRING MD:  Roseanne Reno - Neuro  CHIEF COMPLAINT:  Seizures  HISTORY OF PRESENT ILLNESS:  Pt is encephelopathic; therefore, this HPI is obtained from chart review. Brandon Silva is a 55 y.o. male with PMH as outlined below. He was brought to [facility] ED 9/20 with seizure activity.  He apparently called EMS just after midnight and stated that he did not feel well.  On EMS arrival, pt was on the floor of his home with slurred speech and reported right sided weakness.  En route to ED, he had witnessed seizure that lasted several minutes.  He had additional seizure activity after arrival to [facility] ED.  He was treated with midazolam, diazepam en route to ED, and once in ED, received lorazepam, propofol, and keppra.  He was then intubated for airway protection. He reportedly has hx of poor compliance with anticonvulsant medication and also has hx of chronic ETOH use.  CT scan of the head was negative.  SUBJECTIVE: Patient had focal jerking movements in bilateral arms overnight and was given Ativan IV 1.  REVIEW OF SYSTEMS:  Unable to obtain on ventilator & sedated.  VITAL SIGNS: BP (!) 145/93   Pulse 63   Temp 97.7 F (36.5 C) (Axillary)   Resp 18   Ht 6\' 2"  (1.88 m)   Wt 221 lb 12.5 oz (100.6 kg)   SpO2 100%   BMI 28.48 kg/m   HEMODYNAMICS:    VENTILATOR SETTINGS: Vent Mode: PRVC FiO2 (%):  [40 %] 40 % Set Rate:  [18 bmp] 18 bmp Vt Set:  [620 mL] 620 mL PEEP:  [5 cmH20] 5 cmH20 Plateau Pressure:  [15 cmH20-16 cmH20] 16 cmH20  INTAKE / OUTPUT: I/O last 3 completed shifts: In: 4782.1 [I.V.:3275.1; NG/GT:1247; IV Piggyback:260] Out: 2685 [Urine:2685]   PHYSICAL EXAMINATION: General: No distress. No family at bedside. Sedated. Neuro: Pupils symmetric and equal. Gaze is forward. No spontaneous movements on sedation. HEENT: Mild  exophthalmos. Endotracheal tube in place. Moistmembranes. Cardiovascular: Regular rate. No edema. No appreciable JVD. Lungs: Clear on auscultation bilaterally. Symmetric chest rise on ventilator. Abdomen: Soft. Protuberant. Normal bowel sounds. Musculoskeletal: No joint deformity or effusion appreciated. Cervical collar in place. Skin: Warm and dry. No rash on exposed skin.  LABS:  BMET  Recent Labs Lab 09/17/16 0232 09/17/16 0239 09/17/16 0530 09/18/16 0336  NA 138 140 138 138  K 3.7 3.8 3.7 3.8  CL 105 105 106 107  CO2 21*  --  19* 23  BUN 12 13 12 13   CREATININE 1.31* 1.60* 1.19 1.12  GLUCOSE 130* 130* 142* 130*    Electrolytes  Recent Labs Lab 09/17/16 0232  09/17/16 0530 09/17/16 1002 09/17/16 1704 09/18/16 0336  CALCIUM 9.1  --  8.5*  --   --  8.4*  MG  --   < > 1.9 1.9 2.1 2.2  PHOS  --   < > 3.4 2.6 2.7 2.8  < > = values in this interval not displayed.  CBC  Recent Labs Lab 09/17/16 0232 09/17/16 0239 09/17/16 0530 09/18/16 0336  WBC 8.4  --  6.8 6.8  HGB 15.1 15.6 13.4 13.1  HCT 44.5 46.0 39.6 39.1  PLT 237  --  214 203    Coag's  Recent Labs Lab 09/17/16 0232  APTT 28  INR 1.16    Sepsis Markers  Recent  Labs Lab 09/17/16 0530 09/17/16 0739  LATICACIDVEN 3.1* 3.2*    ABG  Recent Labs Lab 09/17/16 0606  PHART 7.357  PCO2ART 39.0  PO2ART 481.0*    Liver Enzymes  Recent Labs Lab 09/17/16 0232 09/18/16 0336  AST 31 24  ALT 20 17  ALKPHOS 80 76  BILITOT 0.7 0.9  ALBUMIN 4.2 3.2*    Cardiac Enzymes  Recent Labs Lab 09/17/16 0530 09/17/16 0739  TROPONINI <0.03 <0.03    Glucose  Recent Labs Lab 09/17/16 1156 09/17/16 1534 09/17/16 1931 09/17/16 2323 09/18/16 0311 09/18/16 0743  GLUCAP 109* 97 153* 127* 155* 137*    Imaging Dg Chest Port 1 View  Result Date: 09/18/2016 CLINICAL DATA:  Monday.  Finding in down now.  Head EXAM: PORTABLE CHEST 1 VIEW COMPARISON:  09/17/2016 FINDINGS: Support devices  are stable. Heart is normal size. Minimal lingular subsegmental atelectasis. Right lung is clear. No effusions. No acute bony abnormality. IMPRESSION: Minimal lingular atelectasis. Electronically Signed   By: Charlett NoseKevin  Dover M.D.   On: 09/18/2016 08:36     STUDIES:  CT Head/C-Spine 9/20:No mass or acute infarct. No evidence of fracture or trauma. EEG 9/20: Markedly abnormal due to diffuse background suppression and lack of EEG reactivity with stimulation. Could be secondary to sedation or toxic/metabolic encephalopathy. Port CXR 9/21:  Endotracheal tube in good position. Rotation to the right with elevation of left hemidiaphragm. No focal opacity.  MICROBIOLOGY: MRSA PCR 9/20:  Negative   ANTIBIOTICS: None.  SIGNIFICANT EVENTS: 9/20 - admitted with seizure activity, required intubation for airway protection.  LINES/TUBES: OETT 7.5 9/20 >> OGT 9/20 >> Foley 9/20 >> PIV x2  ASSESSMENT / PLAN:  NEUROLOGIC A:   Acute Encephalopathy - Likely due to post-ictal state. Seizures - Poor compliance w/ AEDs. Questionable seizure overnight. H/O EtOH Use  P:   RASS goal: 0 to -1 Propofol gtt Versed gtt & IV prn Fentanyl IV prn Pain Daily WUA. AED's per Neuro:  Keppra Ativan IV prn Seizure Neuro following. Thiamine & FA IV daily Seizure Precautions  PULMONARY A: Acute Respiratory Failure - Unable to protect airway.  P:   Full vent support. Wean as able. VAP prevention measures. SBT as mental status allows Intermittent CXR & ABG  CARDIOVASCULAR A:  No acute issues.  P:  Monitoring vitals per unit protocol Continuous Telemetry Monitoring  RENAL A:   Acute Renal Failure - Resolving. Hypocalcemia - Mild.  P:   Trending UOP with Foley Monitoring electrolytes daily Replacing electrolytes as indicated NS MIVF @ 100cc/hr  GASTROINTESTINAL A:   Chronic EtOH use  P:   NPO Protonix VT daily Tube Feedings  HEMATOLOGIC A:   No acute issues.  P:  Trending cell  counts daily w/ CBC SCDs Heparin [place] q8hr  INFECTIOUS A:   No acute infection.  P:   Monitor for signs or symptoms  ENDOCRINE A:   H/O DM - Glucose controlled.  P:   Accu-Checks q4hr SSI per Moderate Algorithm   MUSCULOSKELETAL A: Unwitnessed Fall - At home prior to EMS arrival. No fracture on CT.  P: Continuing C-collar  Family updated: None available 9/21.  Interdisciplinary Family Meeting v Palliative Care Meeting:  Due by: 9/27.  TODAY'S SUMMARY:  55 y.o. male admitted 9/20 with seizure activity, possible status.  Required intubation in ED for airway protection. Questionable seizure activity overnight. Continuing Versed infusion and weaning propofol infusion. Appreciate assistance from neurology.  I have spent a total of 34 minutes of critical  care time today caring for the patient and reviewing the patient's electronic medical record.  Donna Christen Jamison Neighbor, M.D. [facility] Pulmonary & Critical Care Pager:  [phone] After 3pm or if no response, call [phone] 09/18/2016 9:36 AM

## 2016-09-18 NOTE — Progress Notes (Signed)
eLink Physician-Brief Progress Note Patient Name: Brandon BrennerDavid Silva DOB: Dec 31, 1960 MRN: [id number]   Date of Service  09/18/2016  HPI/Events of Note  Small jerkinig movements b/l arms  eICU Interventions  Ativan 4 mg IV x 1 now     Intervention Category Evaluation Type: Other  Erin FullingKurian Kreed Kauffman 09/18/2016, 5:13 AM

## 2016-09-19 ENCOUNTER — Inpatient Hospital Stay (HOSPITAL_COMMUNITY): Payer: Self-pay

## 2016-09-19 ENCOUNTER — Encounter (HOSPITAL_COMMUNITY): Payer: Self-pay | Admitting: Radiology

## 2016-09-19 DIAGNOSIS — E872 Acidosis: Secondary | ICD-10-CM

## 2016-09-19 DIAGNOSIS — F10231 Alcohol dependence with withdrawal delirium: Secondary | ICD-10-CM

## 2016-09-19 LAB — CBC WITH DIFFERENTIAL/PLATELET
Basophils Absolute: 0 10*3/uL (ref 0.0–0.1)
Basophils Relative: 0 %
EOS ABS: 0.1 10*3/uL (ref 0.0–0.7)
EOS PCT: 1 %
HCT: 41.6 % (ref 39.0–52.0)
Hemoglobin: 13.6 g/dL (ref 13.0–17.0)
LYMPHS ABS: 1.4 10*3/uL (ref 0.7–4.0)
Lymphocytes Relative: 16 %
MCH: 30.4 pg (ref 26.0–34.0)
MCHC: 32.7 g/dL (ref 30.0–36.0)
MCV: 92.9 fL (ref 78.0–100.0)
MONOS PCT: 8 %
Monocytes Absolute: 0.7 10*3/uL (ref 0.1–1.0)
Neutro Abs: 6.2 10*3/uL (ref 1.7–7.7)
Neutrophils Relative %: 75 %
PLATELETS: 201 10*3/uL (ref 150–400)
RBC: 4.48 MIL/uL (ref 4.22–5.81)
RDW: 13.1 % (ref 11.5–15.5)
WBC: 8.4 10*3/uL (ref 4.0–10.5)

## 2016-09-19 LAB — URINALYSIS, ROUTINE W REFLEX MICROSCOPIC
BILIRUBIN URINE: NEGATIVE
GLUCOSE, UA: NEGATIVE mg/dL
HGB URINE DIPSTICK: NEGATIVE
Ketones, ur: NEGATIVE mg/dL
Leukocytes, UA: NEGATIVE
Nitrite: NEGATIVE
PROTEIN: NEGATIVE mg/dL
Specific Gravity, Urine: 1.025 (ref 1.005–1.030)
pH: 5.5 (ref 5.0–8.0)

## 2016-09-19 LAB — TRIGLYCERIDES: Triglycerides: 102 mg/dL (ref <150)

## 2016-09-19 LAB — BASIC METABOLIC PANEL
Anion gap: 9 (ref 5–15)
BUN: 11 mg/dL (ref 6–20)
CHLORIDE: 108 mmol/L (ref 101–111)
CO2: 21 mmol/L — ABNORMAL LOW (ref 22–32)
CREATININE: 1.12 mg/dL (ref 0.61–1.24)
Calcium: 8.4 mg/dL — ABNORMAL LOW (ref 8.9–10.3)
GFR calc Af Amer: >60 mL/min (ref >60)
GFR calc non Af Amer: >60 mL/min (ref >60)
Glucose, Bld: 158 mg/dL — ABNORMAL HIGH (ref 65–99)
Potassium: 3.5 mmol/L (ref 3.5–5.1)
SODIUM: 138 mmol/L (ref 135–145)

## 2016-09-19 LAB — GLUCOSE, CAPILLARY
GLUCOSE-CAPILLARY: 181 mg/dL — AB (ref 65–99)
Glucose-Capillary: 163 mg/dL — ABNORMAL HIGH (ref 65–99)
Glucose-Capillary: 195 mg/dL — ABNORMAL HIGH (ref 65–99)
Glucose-Capillary: 174 mg/dL — ABNORMAL HIGH (ref 65–99)
Glucose-Capillary: 194 mg/dL — ABNORMAL HIGH (ref 65–99)

## 2016-09-19 MED ORDER — SODIUM CHLORIDE 0.9% FLUSH
10.0000 mL | Freq: Two times a day (BID) | INTRAVENOUS | Status: DC
  Administered 2016-09-19: 10 mL
  Administered 2016-09-20: 20 mL
  Administered 2016-09-20: 10 mL

## 2016-09-19 MED ORDER — HYDRALAZINE HCL 20 MG/ML IJ SOLN
10.0000 mg | INTRAMUSCULAR | Status: DC | PRN
  Administered 2016-09-19 – 2016-09-21 (×2): 10 mg via INTRAVENOUS
  Filled 2016-09-19 (×2): qty 1

## 2016-09-19 MED ORDER — ACETAMINOPHEN 160 MG/5ML PO SOLN
650.0000 mg | Freq: Four times a day (QID) | ORAL | Status: DC | PRN
  Administered 2016-09-19: 650 mg via ORAL
  Filled 2016-09-19: qty 20.3

## 2016-09-19 MED ORDER — LEVOFLOXACIN IN D5W 750 MG/150ML IV SOLN
750.0000 mg | INTRAVENOUS | Status: DC
  Administered 2016-09-19 – 2016-09-20 (×2): 750 mg via INTRAVENOUS
  Filled 2016-09-19 (×3): qty 150

## 2016-09-19 MED ORDER — SODIUM CHLORIDE 0.9% FLUSH: 10.0000 mL | INTRAVENOUS | Status: DC | PRN

## 2016-09-19 MED ORDER — VANCOMYCIN HCL 10 G IV SOLR
1750.0000 mg | Freq: Once | INTRAVENOUS | Status: AC
  Administered 2016-09-19: 1750 mg via INTRAVENOUS
  Filled 2016-09-19: qty 1750

## 2016-09-19 MED ORDER — VANCOMYCIN HCL IN DEXTROSE 750-5 MG/150ML-% IV SOLN
750.0000 mg | Freq: Two times a day (BID) | INTRAVENOUS | Status: DC
  Administered 2016-09-20 – 2016-09-21 (×3): 750 mg via INTRAVENOUS
  Filled 2016-09-19 (×4): qty 150

## 2016-09-19 NOTE — Progress Notes (Signed)
Pharmacy Antibiotic Note  Rojelio BrennerDavid Kamara is a 55 y.o. male admitted on 09/17/2016 with seizure.  Pharmacy has been consulted for vancomycin and levofloxacin dosing for fever.  Baseline labs reviewed.  Plan: - Vanc 1750mg  IV x 1, then 750mg  IV Q12H - LVQ 750mg  IV Q24H - Monitor renal fxn, clinical progress, vanc trough at Css  Height: 6\' 2"  (188 cm) Weight: 223 lb 15.8 oz (101.6 kg) IBW/kg (Calculated) : 82.2  Temp (24hrs), Avg:98.8 F (37.1 C), Min:97.9 F (36.6 C), Max:100.2 F (37.9 C)   Recent Labs Lab 09/17/16 0232 09/17/16 0239 09/17/16 0530 09/17/16 0739 09/18/16 0336 09/19/16 0318  WBC 8.4  --  6.8  --  6.8 8.4  CREATININE 1.31* 1.60* 1.19  --  1.12 1.12  LATICACIDVEN  --   --  3.1* 3.2*  --   --     Estimated Creatinine Clearance: 98.2 mL/min (by C-G formula based on SCr of 1.12 mg/dL).    Allergies  Allergen Reactions  . Penicillins Rash    Has patient had a PCN reaction causing immediate rash, facial/tongue/throat swelling, SOB or lightheadedness with hypotension: Yes Has patient had a PCN reaction causing severe rash involving mucus membranes or skin necrosis: No Has patient had a PCN reaction that required hospitalization: No Has patient had a PCN reaction occurring within the last 10 years: No If all of the above answers are "NO", then may proceed with Cephalosporin use.     Antimicrobials this admission:  Vanc 9/22 >> LVQ 9/22 >>  Dose adjustments this admission:  N/A  Microbiology results:  9/20 MRSA PCR - negative 9/22 BCx x2 -   Boluwatife Flight D. Laney Potashang, PharmD, BCPS Pager:  [phone] 09/19/2016, 4:13 PM

## 2016-09-19 NOTE — Care Management Important Message (Signed)
Important Message  Patient Details  Name: Rojelio BrennerDavid Gadd MRN: [id number] Date of Birth: January 29, 1961   Medicare Important Message Given:  Yes    Hagop Mccollam 09/19/2016, 1:05 PM

## 2016-09-19 NOTE — Consult Note (Signed)
Progress Note    Chief Complaint: Generalized seizures, recurrent.  Subjective: Currently on versed and precedex for agitation. MRI not completed yet, EEG showed suppression.  Past Medical History:  Diagnosis Date  . Alcohol abuse   . Diabetes mellitus without complication ([place])   . Seizures ([place])     History reviewed. No pertinent surgical history.  Family History  Problem Relation Age of Onset  . Diabetes Other    Social History:  reports that he has been smoking Cigarettes.  He does not have any smokeless tobacco history on file. He reports that he drinks alcohol. He reports that he does not use drugs.  Allergies:  Allergies  Allergen Reactions  . Penicillins Rash    Has patient had a PCN reaction causing immediate rash, facial/tongue/throat swelling, SOB or lightheadedness with hypotension: Yes Has patient had a PCN reaction causing severe rash involving mucus membranes or skin necrosis: No Has patient had a PCN reaction that required hospitalization: No Has patient had a PCN reaction occurring within the last 10 years: No If all of the above answers are "NO", then may proceed with Cephalosporin use.     Medications: Unavailable.  ROS: Unable to obtain because of patient's unresponsive state.  Physical Examination: Blood pressure (!) 161/86, pulse 60, temperature 98.7 F (37.1 C), temperature source Axillary, resp. rate 18, height 6' 2"  (1.88 m), weight 101.6 kg (223 lb 15.8 oz), SpO2 100 %.  HEENT-  Normocephalic, no lesions, without obvious abnormality.  Normal external eye and conjunctiva.  Normal TM's bilaterally.  Normal auditory canals and external ears. Normal external nose, mucus membranes and septum.  Normal pharynx. Neck supple with no masses, nodes, nodules or enlargement. Cardiovascular - regular rate and rhythm, S1, S2 normal, no murmur, click, rub or gallop Lungs - chest clear, no wheezing, rales, normal symmetric air entry Abdomen - soft, non-tender;  bowel sounds normal; no masses,  no organomegaly Extremities - no joint deformities, effusion, or inflammation  Neurologic Examination: Patient wakes up to sternal rub, opens eyes, moves all extremities, does not follow command Pupils were equal and reacted normally to light. Extraocular movements were sluggish but conjugate. Face was symmetrical with no focal weakness. Muscle tone normal, able to move all extremities, appears to have equal strength throughout Deep tendon reflexes were hypoactive and symmetrical. Plantar responses were mute.  Results for orders placed or performed during the hospital encounter of 09/17/16 (from the past 48 hour(s))  Magnesium     Status: None   Collection Time: 09/17/16 10:02 AM  Result Value Ref Range   Magnesium 1.9 1.7 - 2.4 mg/dL  Phosphorus     Status: None   Collection Time: 09/17/16 10:02 AM  Result Value Ref Range   Phosphorus 2.6 2.5 - 4.6 mg/dL  TSH     Status: None   Collection Time: 09/17/16 10:02 AM  Result Value Ref Range   TSH 0.529 0.350 - 4.500 uIU/mL  Glucose, capillary     Status: Abnormal   Collection Time: 09/17/16 11:56 AM  Result Value Ref Range   Glucose-Capillary 109 (H) 65 - 99 mg/dL  Glucose, capillary     Status: None   Collection Time: 09/17/16  3:34 PM  Result Value Ref Range   Glucose-Capillary 97 65 - 99 mg/dL  Magnesium     Status: None   Collection Time: 09/17/16  5:04 PM  Result Value Ref Range   Magnesium 2.1 1.7 - 2.4 mg/dL  Phosphorus     Status:  None   Collection Time: 09/17/16  5:04 PM  Result Value Ref Range   Phosphorus 2.7 2.5 - 4.6 mg/dL  Glucose, capillary     Status: Abnormal   Collection Time: 09/17/16  7:31 PM  Result Value Ref Range   Glucose-Capillary 153 (H) 65 - 99 mg/dL  Glucose, capillary     Status: Abnormal   Collection Time: 09/17/16 11:23 PM  Result Value Ref Range   Glucose-Capillary 127 (H) 65 - 99 mg/dL  Glucose, capillary     Status: Abnormal   Collection Time: 09/18/16   3:11 AM  Result Value Ref Range   Glucose-Capillary 155 (H) 65 - 99 mg/dL  Magnesium     Status: None   Collection Time: 09/18/16  3:36 AM  Result Value Ref Range   Magnesium 2.2 1.7 - 2.4 mg/dL  Phosphorus     Status: None   Collection Time: 09/18/16  3:36 AM  Result Value Ref Range   Phosphorus 2.8 2.5 - 4.6 mg/dL  CBC     Status: None   Collection Time: 09/18/16  3:36 AM  Result Value Ref Range   WBC 6.8 4.0 - 10.5 K/uL   RBC 4.24 4.22 - 5.81 MIL/uL   Hemoglobin 13.1 13.0 - 17.0 g/dL   HCT 39.1 39.0 - 52.0 %   MCV 92.2 78.0 - 100.0 fL   MCH 30.9 26.0 - 34.0 pg   MCHC 33.5 30.0 - 36.0 g/dL   RDW 13.2 11.5 - 15.5 %   Platelets 203 150 - 400 K/uL  Comprehensive metabolic panel     Status: Abnormal   Collection Time: 09/18/16  3:36 AM  Result Value Ref Range   Sodium 138 135 - 145 mmol/L   Potassium 3.8 3.5 - 5.1 mmol/L   Chloride 107 101 - 111 mmol/L   CO2 23 22 - 32 mmol/L   Glucose, Bld 130 (H) 65 - 99 mg/dL   BUN 13 6 - 20 mg/dL   Creatinine, Ser 1.12 0.61 - 1.24 mg/dL   Calcium 8.4 (L) 8.9 - 10.3 mg/dL   Total Protein 6.1 (L) 6.5 - 8.1 g/dL   Albumin 3.2 (L) 3.5 - 5.0 g/dL   AST 24 15 - 41 U/L   ALT 17 17 - 63 U/L   Alkaline Phosphatase 76 38 - 126 U/L   Total Bilirubin 0.9 0.3 - 1.2 mg/dL   GFR calc non Af Amer >60 >60 mL/min   GFR calc Af Amer >60 >60 mL/min    Comment: (NOTE) The eGFR has been calculated using the CKD EPI equation. This calculation has not been validated in all clinical situations. eGFR's persistently <60 mL/min signify possible Chronic Kidney Disease.    Anion gap 8 5 - 15  Glucose, capillary     Status: Abnormal   Collection Time: 09/18/16  7:43 AM  Result Value Ref Range   Glucose-Capillary 137 (H) 65 - 99 mg/dL  Glucose, capillary     Status: Abnormal   Collection Time: 09/18/16 11:22 AM  Result Value Ref Range   Glucose-Capillary 140 (H) 65 - 99 mg/dL  Glucose, capillary     Status: Abnormal   Collection Time: 09/18/16  3:44 PM   Result Value Ref Range   Glucose-Capillary 120 (H) 65 - 99 mg/dL  Magnesium     Status: None   Collection Time: 09/18/16  5:43 PM  Result Value Ref Range   Magnesium 2.1 1.7 - 2.4 mg/dL  Phosphorus  Status: Abnormal   Collection Time: 09/18/16  5:43 PM  Result Value Ref Range   Phosphorus 2.2 (L) 2.5 - 4.6 mg/dL  Glucose, capillary     Status: Abnormal   Collection Time: 09/18/16  7:14 PM  Result Value Ref Range   Glucose-Capillary 154 (H) 65 - 99 mg/dL  Glucose, capillary     Status: Abnormal   Collection Time: 09/18/16 11:17 PM  Result Value Ref Range   Glucose-Capillary 153 (H) 65 - 99 mg/dL  Glucose, capillary     Status: Abnormal   Collection Time: 09/19/16  3:05 AM  Result Value Ref Range   Glucose-Capillary 181 (H) 65 - 99 mg/dL  Triglycerides     Status: None   Collection Time: 09/19/16  3:18 AM  Result Value Ref Range   Triglycerides 102 <150 mg/dL  Basic metabolic panel     Status: Abnormal   Collection Time: 09/19/16  3:18 AM  Result Value Ref Range   Sodium 138 135 - 145 mmol/L   Potassium 3.5 3.5 - 5.1 mmol/L   Chloride 108 101 - 111 mmol/L   CO2 21 (L) 22 - 32 mmol/L   Glucose, Bld 158 (H) 65 - 99 mg/dL   BUN 11 6 - 20 mg/dL   Creatinine, Ser 1.12 0.61 - 1.24 mg/dL   Calcium 8.4 (L) 8.9 - 10.3 mg/dL   GFR calc non Af Amer >60 >60 mL/min   GFR calc Af Amer >60 >60 mL/min    Comment: (NOTE) The eGFR has been calculated using the CKD EPI equation. This calculation has not been validated in all clinical situations. eGFR's persistently <60 mL/min signify possible Chronic Kidney Disease.    Anion gap 9 5 - 15  CBC with Differential/Platelet     Status: None   Collection Time: 09/19/16  3:18 AM  Result Value Ref Range   WBC 8.4 4.0 - 10.5 K/uL   RBC 4.48 4.22 - 5.81 MIL/uL   Hemoglobin 13.6 13.0 - 17.0 g/dL   HCT 41.6 39.0 - 52.0 %   MCV 92.9 78.0 - 100.0 fL   MCH 30.4 26.0 - 34.0 pg   MCHC 32.7 30.0 - 36.0 g/dL   RDW 13.1 11.5 - 15.5 %   Platelets  201 150 - 400 K/uL   Neutrophils Relative % 75 %   Neutro Abs 6.2 1.7 - 7.7 K/uL   Lymphocytes Relative 16 %   Lymphs Abs 1.4 0.7 - 4.0 K/uL   Monocytes Relative 8 %   Monocytes Absolute 0.7 0.1 - 1.0 K/uL   Eosinophils Relative 1 %   Eosinophils Absolute 0.1 0.0 - 0.7 K/uL   Basophils Relative 0 %   Basophils Absolute 0.0 0.0 - 0.1 K/uL  Glucose, capillary     Status: Abnormal   Collection Time: 09/19/16  7:28 AM  Result Value Ref Range   Glucose-Capillary 163 (H) 65 - 99 mg/dL   Dg Chest Port 1 View  Result Date: 09/18/2016 CLINICAL DATA:  Monday.  Finding in down now.  Head EXAM: PORTABLE CHEST 1 VIEW COMPARISON:  09/17/2016 FINDINGS: Support devices are stable. Heart is normal size. Minimal lingular subsegmental atelectasis. Right lung is clear. No effusions. No acute bony abnormality. IMPRESSION: Minimal lingular atelectasis. Electronically Signed   By: Rolm Baptise M.D.   On: 09/18/2016 08:36    Assessment/Plan 55 year old man with a history of seizure disorder, diabetes mellitus and alcohol abuse resenting with recurrent seizure activity with multiple witnessed generalized seizures.  Recommendations:  1. Continue Keppra 2. Minimize sedation to get a better exam 3.. MRI brain still pending  4. EEG showed suppression consistent with sedation  We will continue to follow this patient with you.    09/19/2016, 9:28 AM

## 2016-09-19 NOTE — Progress Notes (Signed)
PULMONARY / CRITICAL CARE MEDICINE   Name: Brandon Silva MRN: [id number] DOB: 01-07-1961    ADMISSION DATE:  09/17/2016 CONSULTATION DATE:  09/17/16  REFERRING MD:  Roseanne Reno - Neuro  CHIEF COMPLAINT:  Seizures  HISTORY OF PRESENT ILLNESS:  Pt is encephelopathic; therefore, this HPI is obtained from chart review. Brandon Silva is a 55 y.o. male with PMH as outlined below. He was brought to [facility] ED 9/20 with seizure activity.  He apparently called EMS just after midnight and stated that he did not feel well.  On EMS arrival, pt was on the floor of his home with slurred speech and reported right sided weakness.  En route to ED, he had witnessed seizure that lasted several minutes.  He had additional seizure activity after arrival to [facility] ED.  He was treated with midazolam, diazepam en route to ED, and once in ED, received lorazepam, propofol, and keppra.  He was then intubated for airway protection. He reportedly has hx of poor compliance with anticonvulsant medication and also has hx of chronic ETOH use.  CT scan of the head was negative.  SUBJECTIVE: Patient had no more visible focal events overnight. He was successfully transitioned off propofol over to Precedex and Versed infusions. Patient more awake this morning but still not following commands with significant oral secretions. No acute events overnight.  REVIEW OF SYSTEMS:  Unable to obtain on ventilator & sedated.  VITAL SIGNS: BP 137/80   Pulse (!) 57   Temp 98.7 F (37.1 C) (Axillary)   Resp 18   Ht 6\' 2"  (1.88 m)   Wt 223 lb 15.8 oz (101.6 kg)   SpO2 100%   BMI 28.76 kg/m   HEMODYNAMICS:    VENTILATOR SETTINGS: Vent Mode: PRVC FiO2 (%):  [40 %] 40 % Set Rate:  [18 bmp] 18 bmp Vt Set:  [620 mL] 620 mL PEEP:  [5 cmH20] 5 cmH20 Plateau Pressure:  [16 cmH20-23 cmH20] 18 cmH20  INTAKE / OUTPUT: I/O last 3 completed shifts: In: 7530.8 [I.V.:4680.8; NG/GT:2520; IV Piggyback:330] Out: 2060 [Urine:2060]   PHYSICAL  EXAMINATION: General: No distress. No family at bedside. Eyes open. Neuro: Pupils symmetric and equal. Gaze is forward. Spontaneously moving all 4 extremities. Not following commands. HEENT: Mild exophthalmos. Endotracheal tube in place. Excessive oral secretions. Cardiovascular: Regular rhythm. No edema. No appreciable JVD. Lungs: Clear on auscultation bilaterally. Symmetric chest rise on ventilator. Abdomen: Soft. Protuberant. Normal bowel sounds. Musculoskeletal: No joint deformity or effusion appreciated. Cervical collar in place. Skin: Warm and dry. No rash on exposed skin.  LABS:  BMET  Recent Labs Lab 09/17/16 0530 09/18/16 0336 09/19/16 0318  NA 138 138 138  K 3.7 3.8 3.5  CL 106 107 108  CO2 19* 23 21*  BUN 12 13 11   CREATININE 1.19 1.12 1.12  GLUCOSE 142* 130* 158*    Electrolytes  Recent Labs Lab 09/17/16 0530  09/17/16 1704 09/18/16 0336 09/18/16 1743 09/19/16 0318  CALCIUM 8.5*  --   --  8.4*  --  8.4*  MG 1.9  < > 2.1 2.2 2.1  --   PHOS 3.4  < > 2.7 2.8 2.2*  --   < > = values in this interval not displayed.  CBC  Recent Labs Lab 09/17/16 0530 09/18/16 0336 09/19/16 0318  WBC 6.8 6.8 8.4  HGB 13.4 13.1 13.6  HCT 39.6 39.1 41.6  PLT 214 203 201    Coag's  Recent Labs Lab 09/17/16 0232  APTT 28  INR  1.16    Sepsis Markers  Recent Labs Lab 09/17/16 0530 09/17/16 0739  LATICACIDVEN 3.1* 3.2*    ABG  Recent Labs Lab 09/17/16 0606  PHART 7.357  PCO2ART 39.0  PO2ART 481.0*    Liver Enzymes  Recent Labs Lab 09/17/16 0232 09/18/16 0336  AST 31 24  ALT 20 17  ALKPHOS 80 76  BILITOT 0.7 0.9  ALBUMIN 4.2 3.2*    Cardiac Enzymes  Recent Labs Lab 09/17/16 0530 09/17/16 0739  TROPONINI <0.03 <0.03    Glucose  Recent Labs Lab 09/18/16 1122 09/18/16 1544 09/18/16 1914 09/18/16 2317 09/19/16 0305 09/19/16 0728  GLUCAP 140* 120* 154* 153* 181* 163*    Imaging No results found.   STUDIES:  CT  Head/C-Spine 9/20:No mass or acute infarct. No evidence of fracture or trauma. EEG 9/20: Markedly abnormal due to diffuse background suppression and lack of EEG reactivity with stimulation. Could be secondary to sedation or toxic/metabolic encephalopathy. Port CXR 9/21:  Endotracheal tube in good position. Rotation to the right with elevation of left hemidiaphragm. No focal opacity.  MICROBIOLOGY: MRSA PCR 9/20:  Negative   ANTIBIOTICS: None.  SIGNIFICANT EVENTS: 9/20 - admitted with seizure activity, required intubation for airway protection.  LINES/TUBES: OETT 7.5 9/20 >> OGT 9/20 >> Foley 9/20 >> PIV x2  ASSESSMENT / PLAN:  NEUROLOGIC A:   Acute Encephalopathy - Likely due to post-ictal state. Seizures - Poor compliance w/ AEDs. Questionable seizure overnight. H/O EtOH Use  P:   RASS goal: 0 to -1 Versed gtt & IV prn Fentanyl IV prn Pain Precedex gtt Daily WUA. AED's per Neuro:  Keppra Ativan IV prn Seizure Neuro following. Thiamine & FA IV daily Seizure Precautions MRI w/ & w/o ordered  PULMONARY A: Acute Respiratory Failure - Unable to protect airway.  P:   Full vent support. Wean as able. VAP prevention measures. SBT as mental status allows Intermittent CXR & ABG  CARDIOVASCULAR A:  No acute issues.  P:  Monitoring vitals per unit protocol Continuous Telemetry Monitoring  RENAL A:   Acute Renal Failure - Resolving. Metabolic Acidosis - Mild. Hypocalcemia - Mild.  P:   Trending UOP with Foley Monitoring electrolytes daily Replacing electrolytes as indicated NS MIVF @ 100cc/hr  GASTROINTESTINAL A:   Chronic EtOH use  P:   NPO Protonix VT daily Continue Tube Feedings  HEMATOLOGIC A:   No acute issues.  P:  Trending cell counts daily w/ CBC SCDs Heparin [place] q8hr  INFECTIOUS A:   No acute infection.  P:   Monitor for signs or symptoms  ENDOCRINE A:   H/O DM - Glucose controlled.  P:   Accu-Checks q4hr SSI per  Moderate Algorithm   MUSCULOSKELETAL A: Unwitnessed Fall - At home prior to EMS arrival. No fracture on CT.  P: Continuing C-collar  Family updated: None available 9/22.  Interdisciplinary Family Meeting v Palliative Care Meeting:  Due by: 9/27.  TODAY'S SUMMARY:  55 y.o. male admitted 9/20 with seizure activity, possible status.  Required intubation in ED for airway protection. Level of alertness improving with transition off of propofol over to Precedex infusion. Increasing Precedex infusion with the hope of being able to wean Versed infusion. Ordering MRI to evaluate given seizure activity. Appreciate assistance from neurology. Holding on spontaneous breathing trial until patient's mental status and level of alertness improve.  I have spent a total of 31 minutes of critical care time today caring for the patient and reviewing the patient's electronic medical record.  Donna ChristenJennings E. Jamison NeighborNestor, M.D. [facility] Pager:  [phone] After 3pm or if no response, call [phone] 09/19/2016 10:25 AM

## 2016-09-19 NOTE — Progress Notes (Signed)
eLink Physician-Brief Progress Note Patient Name: Brandon BrennerDavid Silva DOB: 12/04/1961 MRN: [id number]   Date of Service  09/19/2016  HPI/Events of Note  Fever - Temp = 101.3 F. Aspiration? Patient is Penicillin allergic.   eICU Interventions  Will order: 1. Blood cultures X 2. 2. Tracheal aspirate culture now. 3. UA. 4. Vancomycin and Levaquin per pharmacy consultation. 5. Tylenol liquid 650 mg per tube Q 6 hours PRN Temp > 101.0 F.     Intervention Category Major Interventions: Infection - evaluation and management  Sommer,Steven Eugene 09/19/2016, 4:08 PM

## 2016-09-20 ENCOUNTER — Encounter (HOSPITAL_COMMUNITY): Payer: Self-pay | Admitting: *Deleted

## 2016-09-20 DIAGNOSIS — J9600 Acute respiratory failure, unspecified whether with hypoxia or hypercapnia: Secondary | ICD-10-CM

## 2016-09-20 DIAGNOSIS — G9340 Encephalopathy, unspecified: Secondary | ICD-10-CM

## 2016-09-20 LAB — CBC WITH DIFFERENTIAL/PLATELET
Basophils Absolute: 0 10*3/uL (ref 0.0–0.1)
Basophils Relative: 0 %
EOS ABS: 0.1 10*3/uL (ref 0.0–0.7)
EOS PCT: 1 %
HCT: 41.7 % (ref 39.0–52.0)
Hemoglobin: 13.3 g/dL (ref 13.0–17.0)
LYMPHS ABS: 1.5 10*3/uL (ref 0.7–4.0)
LYMPHS PCT: 14 %
MCH: 29.9 pg (ref 26.0–34.0)
MCHC: 31.9 g/dL (ref 30.0–36.0)
MCV: 93.7 fL (ref 78.0–100.0)
MONO ABS: 0.8 10*3/uL (ref 0.1–1.0)
Monocytes Relative: 8 %
Neutro Abs: 8 10*3/uL — ABNORMAL HIGH (ref 1.7–7.7)
Neutrophils Relative %: 77 %
PLATELETS: 177 10*3/uL (ref 150–400)
RBC: 4.45 MIL/uL (ref 4.22–5.81)
RDW: 12.8 % (ref 11.5–15.5)
WBC: 10.4 10*3/uL (ref 4.0–10.5)

## 2016-09-20 LAB — RENAL FUNCTION PANEL
Albumin: 2.7 g/dL — ABNORMAL LOW (ref 3.5–5.0)
Anion gap: 10 (ref 5–15)
BUN: 11 mg/dL (ref 6–20)
CALCIUM: 8.7 mg/dL — AB (ref 8.9–10.3)
CHLORIDE: 107 mmol/L (ref 101–111)
CO2: 22 mmol/L (ref 22–32)
CREATININE: 0.99 mg/dL (ref 0.61–1.24)
GFR calc Af Amer: >60 mL/min (ref >60)
GFR calc non Af Amer: >60 mL/min (ref >60)
GLUCOSE: 181 mg/dL — AB (ref 65–99)
Phosphorus: 3.3 mg/dL (ref 2.5–4.6)
Potassium: 3.7 mmol/L (ref 3.5–5.1)
SODIUM: 139 mmol/L (ref 135–145)

## 2016-09-20 LAB — GLUCOSE, CAPILLARY
GLUCOSE-CAPILLARY: 147 mg/dL — AB (ref 65–99)
GLUCOSE-CAPILLARY: 164 mg/dL — AB (ref 65–99)
GLUCOSE-CAPILLARY: 242 mg/dL — AB (ref 65–99)
GLUCOSE-CAPILLARY: 55 mg/dL — AB (ref 65–99)
Glucose-Capillary: 140 mg/dL — ABNORMAL HIGH (ref 65–99)
Glucose-Capillary: 156 mg/dL — ABNORMAL HIGH (ref 65–99)
Glucose-Capillary: 93 mg/dL (ref 65–99)
Glucose-Capillary: 117 mg/dL — ABNORMAL HIGH (ref 65–99)

## 2016-09-20 LAB — MAGNESIUM: Magnesium: 1.7 mg/dL (ref 1.7–2.4)

## 2016-09-20 MED ORDER — POTASSIUM CHLORIDE 20 MEQ/15ML (10%) PO SOLN
40.0000 meq | Freq: Once | ORAL | Status: AC
  Administered 2016-09-20: 40 meq
  Filled 2016-09-20: qty 30

## 2016-09-20 MED ORDER — DEXTROSE-NACL 5-0.45 % IV SOLN
INTRAVENOUS | Status: DC
  Administered 2016-09-20: 11:00:00 via INTRAVENOUS

## 2016-09-20 MED ORDER — HALOPERIDOL LACTATE 5 MG/ML IJ SOLN
2.0000 mg | INTRAMUSCULAR | Status: DC | PRN
  Administered 2016-09-20 – 2016-09-21 (×2): 2 mg via INTRAVENOUS
  Filled 2016-09-20: qty 1

## 2016-09-20 MED ORDER — DEXTROSE 50 % IV SOLN
INTRAVENOUS | Status: AC
  Filled 2016-09-20: qty 50

## 2016-09-20 MED ORDER — LORAZEPAM 2 MG/ML IJ SOLN
1.0000 mg | Freq: Four times a day (QID) | INTRAMUSCULAR | Status: DC | PRN
  Administered 2016-09-20 – 2016-09-21 (×3): 1 mg via INTRAVENOUS
  Filled 2016-09-20 (×3): qty 1

## 2016-09-20 MED ORDER — MAGNESIUM SULFATE 2 GM/50ML IV SOLN
2.0000 g | Freq: Once | INTRAVENOUS | Status: AC
  Administered 2016-09-20: 2 g via INTRAVENOUS
  Filled 2016-09-20: qty 50

## 2016-09-20 MED ORDER — LORAZEPAM 1 MG PO TABS
1.0000 mg | ORAL_TABLET | Freq: Four times a day (QID) | ORAL | Status: DC | PRN
Start: 2016-09-20 — End: 2016-09-21

## 2016-09-20 MED ORDER — ADULT MULTIVITAMIN W/MINERALS CH: 1.0000 | ORAL_TABLET | Freq: Every day | ORAL | Status: DC

## 2016-09-20 MED ORDER — HALOPERIDOL LACTATE 5 MG/ML IJ SOLN
INTRAMUSCULAR | Status: AC
  Filled 2016-09-20: qty 1

## 2016-09-20 MED ORDER — DEXTROSE 50 % IV SOLN
1.0000 | Freq: Once | INTRAVENOUS | Status: AC
  Administered 2016-09-20: 50 mL via INTRAVENOUS

## 2016-09-20 MED ORDER — ORAL CARE MOUTH RINSE
15.0000 mL | Freq: Two times a day (BID) | OROMUCOSAL | Status: DC
  Administered 2016-09-20 – 2016-09-21 (×3): 15 mL via OROMUCOSAL

## 2016-09-20 MED ORDER — GADOBENATE DIMEGLUMINE 529 MG/ML IV SOLN
20.0000 mL | Freq: Once | INTRAVENOUS | Status: AC | PRN
  Administered 2016-09-20: 20 mL via INTRAVENOUS

## 2016-09-20 MED ORDER — CHLORHEXIDINE GLUCONATE 0.12 % MT SOLN
15.0000 mL | Freq: Two times a day (BID) | OROMUCOSAL | Status: DC
  Administered 2016-09-20 (×2): 15 mL via OROMUCOSAL

## 2016-09-20 NOTE — Progress Notes (Signed)
PULMONARY / CRITICAL CARE MEDICINE   Name: Mildred Bollard MRN: [id number] DOB: 01-29-1961    ADMISSION DATE:  09/17/2016 CONSULTATION DATE:  09/17/16  REFERRING MD:  Roseanne Reno - Neuro  CHIEF COMPLAINT:  Seizures  BRIEF  Pt is encephelopathic; therefore, this HPI is obtained from chart review. Brandon Silva is a 55 y.o. male with PMH as outlined below. He was brought to [facility] ED 9/20 with seizure activity.  He apparently called EMS just after midnight and stated that he did not feel well.  On EMS arrival, pt was on the floor of his home with slurred speech and reported right sided weakness.  En route to ED, he had witnessed seizure that lasted several minutes.  He had additional seizure activity after arrival to [facility] ED.  He was treated with midazolam, diazepam en route to ED, and once in ED, received lorazepam, propofol, and keppra.  He was then intubated for airway protection. He reportedly has hx of poor compliance with anticonvulsant medication and also has hx of chronic ETOH use.  CT scan of the head was negative.     STUDIES:  CT Head/C-Spine 9/20:No mass or acute infarct. No evidence of fracture or trauma. EEG 9/20: Markedly abnormal due to diffuse background suppression and lack of EEG reactivity with stimulation. Could be secondary to sedation or toxic/metabolic encephalopathy. Port CXR 9/21:  Endotracheal tube in good position. Rotation to the right with elevation of left hemidiaphragm. No focal opacity.  MICROBIOLOGY: MRSA PCR 9/20:  Negative   ANTIBIOTICS: None.  LINES/TUBES: OETT 7.5 9/20 >> OGT 9/20 >> Foley 9/20 >> PIV x2   SIGNIFICANT EVENTS: 9/20 - admitted with seizure activity, required intubation for airway protection. 09/19/16 - : Patient had no more visible focal events overnight. He was successfully transitioned off propofol over to Precedex and Versed infusions. Patient more awake this morning but still not following commands with significant oral secretions. No  acute events overnight.   SUBJECTIVE/OVERNIGHT/INTERVAL HX 09/20/16 - sister and mom at bedside: they says that naturally at hospital patient is agitated and so they do not think agitation should keep him in vent. Also they do not want prolonged vent support if it got to that. Currently on percedex and versed gtt   VITAL SIGNS: BP (!) 159/81 (BP Location: Left Arm)   Pulse (!) 55   Temp 98.1 F (36.7 C) (Oral)   Resp 18   Ht 6\' 2"  (1.88 m)   Wt 106.1 kg (233 lb 14.5 oz)   SpO2 100%   BMI 30.03 kg/m   HEMODYNAMICS:    VENTILATOR SETTINGS: Vent Mode: PRVC FiO2 (%):  [40 %] 40 % Set Rate:  [18 bmp] 18 bmp Vt Set:  [620 mL] 620 mL PEEP:  [5 cmH20] 5 cmH20 Plateau Pressure:  [16 cmH20-18 cmH20] 18 cmH20  INTAKE / OUTPUT: I/O last 3 completed shifts: In: 7950.8 [I.V.:4690.8; NG/GT:2240; IV Piggyback:1020] Out: 2275 [Urine:2275]   PHYSICAL EXAMINATION: General: No distress. No family at bedside. Eyes open. Neuro: RASS -3 on versed and precedex gtt HEENT: Mild exophthalmos. Endotracheal tube in place. Excessive oral secretions. Cardiovascular: Regular rhythm. No edema. No appreciable JVD. Lungs: Clear on auscultation bilaterally. Symmetric chest rise on ventilator. Abdomen: Soft. Protuberant. Normal bowel sounds. Musculoskeletal: No joint deformity or effusion appreciated. Cervical collar in place. Skin: Warm and dry. No rash on exposed skin.  LABS:  PULMONARY  Recent Labs Lab 09/17/16 0239 09/17/16 0606  PHART  --  7.357  PCO2ART  --  39.0  PO2ART  --  481.0*  HCO3  --  21.8  TCO2 23 23  O2SAT  --  100.0    CBC  Recent Labs Lab 09/18/16 0336 09/19/16 0318 09/20/16 0223  HGB 13.1 13.6 13.3  HCT 39.1 41.6 41.7  WBC 6.8 8.4 10.4  PLT 203 201 177    COAGULATION  Recent Labs Lab 09/17/16 0232  INR 1.16    CARDIAC   Recent Labs Lab 09/17/16 0530 09/17/16 0739  TROPONINI <0.03 <0.03   No results for input(s): PROBNP in the last 168  hours.   CHEMISTRY  Recent Labs Lab14 095.06/17/16 0232 09/17/16 0239  09/17/16 0530 09/17/16 1002 09/17/16 [id number] Iho f the head November 25, 2010 FINDINGS: INTRACRANIAL CONTENTS: No reduced diffusion to suggest acute ischemia. No  susceptibility artifact to suggest hemorrhage. The ventricles and sulci are normal for patient's age. No suspicious parenchymal signal, mass lesions, mass effect. No abnormal intraparenchymal or extra-axial enhancement. No abnormal extra-axial fluid collections. No extra-axial masses. Normal  major intracranial vascular flow voids present at skull base. Hippocampi demonstrate symmetric size, morphology and signal. ORBITS: Worsening prominent orbital fat, exophthalmos. The extra-ocular muscles do not appear enlarged though, not tailored for evaluation. Lacrimal glands are displaced, partially outside the bony orbit SINUSES: The mastoid air-cells and included paranasal sinuses are well-aerated. SKULL/SOFT TISSUES: No abnormal sellar expansion. No suspicious calvarial bone marrow signal. Craniocervical junction maintained. Life-support lines in place. IMPRESSION: Negative MRI head with and without contrast. 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 Electronically Signed   By: Awilda Metroourtnay  Bloomer M.D.   On: 09/20/2016 03:39     ASSESSMENT / PLAN:  NEUROLOGIC A:   Acute Encephalopathy - Likely due to post-ictal state. Seizures - Poor compliance w/ AEDs. Questionable seizure overnight. H/O EtOH Use  - deeply sedated. Family think if he is agitated on sedation wean that Is normal and should get extubated  P:   RASS goal: 0 to -1 Versed gtt & IV prn - wean towards extubation aim Fentanyl IV prn Pain Precedex gtt Daily WUA. AED's per Neuro:  Keppra Ativan IV prn Seizure Neuro following. Thiamine & FA IV daily Seizure Precautions MRI w/ & w/o ordered  PULMONARY A: Acute Respiratory Failure - Unable to protect airway.   - does not meet sbt criteria due to mental status  P:   Full vent support. Wean as able. VAP prevention measures. SBT as mental status allows Intermittent CXR & ABG  CARDIOVASCULAR A:  No acute issues.  P:  Monitoring vitals  per unit protocol Continuous Telemetry Monitoring  RENAL A:   Acute Renal Failure - Resolving.    - mild hypokalemia and hypomagnesemia  P:   Trending UOP with Foley Monitoring electrolytes daily Replacing electrolytes as indicated NS MIVF @ 100cc/hr -> chagne to d5 half at 50cc./h  GASTROINTESTINAL A:   Chronic EtOH use  P:   NPO Protonix VT daily Hold TF 09/20/16 for possible extubation   HEMATOLOGIC A:   No acute issues.  P:  Trending cell counts daily w/ CBC SCDs Heparin [place] q8hr  INFECTIOUS A:   No acute infection.  P:   Monitor for signs or symptoms  ENDOCRINE A:   H/O DM - Glucose controlled.  P:   Accu-Checks q4hr SSI per Moderate Algorithm   MUSCULOSKELETAL A: Unwitnessed Fall - At home prior to EMS arrival. No fracture on CT.  P: Continuing C-collar  Family updated: None available 9/22.   Interdisciplinary Family Meeting v Palliative Care Meeting:  Due by: 9/27.  Interdisciplinary Goals of Care Family Meeting   Date carried out:: 09/20/2016  Location of the meeting: Bedside  Member's involved: Physician, Bedside Registered Nurse, Family Member or next of kin and Other: sister and mom  Durable Power of Attorney or Environmental health practitioneracting medical decision maker: sister/mom    Discussion: We discussed goals of care for Albertson'sDavid Lasecki .  - ful code. But posibly no trach. They want him extubated during wua even if he is agitated bdecause they say that is his baseline at hospital. They want to try for 09/20/2016 or 09/21/16   Code status: Full Code  Disposition: Continue current acute care  Time spent for the meeting: 15 min  Kaydense Rizo 09/20/2016, 11:03 AM    TODAY'S SUMMARY:  55 y.o. male admitted 9/20 with seizure activity, possible status.  Required intubation in ED for airway protection. Level of alertness improving with transition off of propofol over to Precedex infusion. Increasing Precedex infusion with the hope  of being able to wean  Versed infusion. Ordering MRI to evaluate given seizure activity. Appreciate assistance from neurology. Holding on spontaneous breathing trial until patient's mental status and level of alertness improve.        The patient is critically ill with multiple organ systems failure and requires high complexity decision making for assessment and support, frequent evaluation and titration of therapies, application of advanced monitoring technologies and extensive interpretation of multiple databases.   Critical Care Time devoted to patient care services described in this note is  30  Minutes. This time reflects time of care of this signee Dr Kalman Shan. This critical care time does not reflect procedure time, or teaching time or supervisory time of PA/NP/Med student/Med Resident etc but could involve care discussion time    Dr. Kalman Shan, M.D., [facility].C.P Pulmonary and Critical Care Medicine Staff Physician [place] [facility] [place] [facility] and Critical Care Pager: [phone], If no answer or between  15:00h - 7:00h: call [phone]  09/20/2016 10:42 AM

## 2016-09-20 NOTE — Progress Notes (Addendum)
eLink Physician-Brief Progress Note Patient Name: Brandon BrennerDavid Silva DOB: 07-Jul-1961 MRN: [id number]   Date of Service  09/20/2016  HPI/Events of Note  Agitated Dilirium - Already on Precedex IV infusion at 1.5 mcg/kg/hour and CIWA protocol. QTc interval = 0.43 seconds.   eICU Interventions  Will order: 1. Haldol 2 mg IV Q 3 hours PRN agitation.      Intervention Category Major Interventions: Delirium, psychosis, severe agitation - evaluation and management  [name] 09/20/2016, 8:42 PM

## 2016-09-20 NOTE — Progress Notes (Signed)
Off versed x 30min rass -1/-2 nodding "yes for extubation"  Plan - continue precedex - extubate   Dr. Kalman ShanMurali Marcoantonio Legault, M.D., [facility].C.P Pulmonary and Critical Care Medicine Staff Physician [facility] [place] [facility] and Critical Care Pager: [phone], If no answer or between  15:00h - 7:00h: call [phone]  09/20/2016 12:08 PM

## 2016-09-20 NOTE — Progress Notes (Signed)
Dr Levada SchillingSummers notified of patient with increased agitation, biting mittens off, and verbally abusive to staff, and stating we are holding him against his will and violating his rights and wants to be transferred to [facility]. Dr Levada SchillingSummers used camera system to assess patient and he stated pt continues to look agitated and the precedex should be increased per titration orders. Orders completed.

## 2016-09-20 NOTE — Progress Notes (Addendum)
eLink Physician-Brief Progress Note Patient Name: Brandon BrennerDavid Silva DOB: 06/17/1961 MRN: [id number]   Date of Service  09/20/2016  HPI/Events of Note  Request for soft waist restraint.  eICU Interventions  Soft waist restraint added to restraint order.      Intervention Category Minor Interventions: Agitation / anxiety - evaluation and management  Lenell AntuSommer,Steven Eugene 09/20/2016, 10:27 PM

## 2016-09-20 NOTE — Procedures (Signed)
Extubation Procedure Note  Patient Details:   Name: Rojelio BrennerDavid Moynahan DOB: Dec 22, 1961 MRN: [id number]   Airway Documentation:     Evaluation  O2 sats: stable throughout Complications: No apparent complications Patient did tolerate procedure well. Bilateral Breath Sounds: Clear   Pt extubated at this time per MD order, pt had + cuff leak, placed on 4L [place] tolerating well, no distress noted at this time  Cherylin MylarDoyle, Dahlton Hinde 09/20/2016, 12:15 PM

## 2016-09-20 NOTE — Progress Notes (Signed)
Subjective: No further seizures. Though it is not clear that he is a daily drinker, he does regularly drank more than 12 beers in a sitting and I think it may be possible that he drinks more often than those reporting are aware  Exam: Vitals:   09/20/16 0739 09/20/16 0800  BP: (!) 165/86 (!) 159/81  Pulse: (!) 49 (!) 55  Resp: 18 18  Temp:  98.1 F (36.7 C)   Gen: In bed, NAD Resp: non-labored breathing, no acute distress Abd: soft, nt  Neuro: MS: Follows commands, though is adversarial in doing so CN: Pupils equal round and reactive, fixates and tracks Motor: Moves all extremities well Sensory: Responds to stimuli in all 4 extremities  Pertinent Labs: BMP-unremarkable  Impression: 55 year old male with a history of substance abuse and seizure disorder with noncompliance who presented with seizures. I'm concerned that his current level of agitation may have some contribution from on call withdrawal and therefore I will order CIWA protocol after extubation.  Recommendations: 1) keppra 1g bud 2) CIWA  Ritta SlotMcNeill Reham Slabaugh, MD [facility] [phone]  If 7pm- 7am, please page neurology on call as listed in AMION.

## 2016-09-21 LAB — RENAL FUNCTION PANEL
Albumin: 3.1 g/dL — ABNORMAL LOW (ref 3.5–5.0)
Anion gap: 13 (ref 5–15)
BUN: 13 mg/dL (ref 6–20)
CALCIUM: 8.8 mg/dL — AB (ref 8.9–10.3)
CO2: 18 mmol/L — ABNORMAL LOW (ref 22–32)
CREATININE: 1.05 mg/dL (ref 0.61–1.24)
Chloride: 105 mmol/L (ref 101–111)
GFR calc Af Amer: >60 mL/min (ref >60)
GLUCOSE: 153 mg/dL — AB (ref 65–99)
PHOSPHORUS: 3.5 mg/dL (ref 2.5–4.6)
POTASSIUM: 4.3 mmol/L (ref 3.5–5.1)
Sodium: 136 mmol/L (ref 135–145)

## 2016-09-21 LAB — GLUCOSE, CAPILLARY
Glucose-Capillary: 101 mg/dL — ABNORMAL HIGH (ref 65–99)
Glucose-Capillary: 148 mg/dL — ABNORMAL HIGH (ref 65–99)
Glucose-Capillary: 152 mg/dL — ABNORMAL HIGH (ref 65–99)

## 2016-09-21 LAB — CBC WITH DIFFERENTIAL/PLATELET
BASOS ABS: 0 10*3/uL (ref 0.0–0.1)
BASOS PCT: 0 %
EOS ABS: 0.1 10*3/uL (ref 0.0–0.7)
EOS PCT: 1 %
HEMATOCRIT: 40.2 % (ref 39.0–52.0)
Hemoglobin: 13.7 g/dL (ref 13.0–17.0)
Lymphocytes Relative: 14 %
Lymphs Abs: 1.3 10*3/uL (ref 0.7–4.0)
MCH: 30.7 pg (ref 26.0–34.0)
MCHC: 34.1 g/dL (ref 30.0–36.0)
MCV: 90.1 fL (ref 78.0–100.0)
MONO ABS: 0.7 10*3/uL (ref 0.1–1.0)
MONOS PCT: 8 %
NEUTROS ABS: 7.2 10*3/uL (ref 1.7–7.7)
Neutrophils Relative %: 77 %
PLATELETS: 201 10*3/uL (ref 150–400)
RBC: 4.46 MIL/uL (ref 4.22–5.81)
RDW: 12.3 % (ref 11.5–15.5)
WBC: 9.4 10*3/uL (ref 4.0–10.5)

## 2016-09-21 LAB — MAGNESIUM: MAGNESIUM: 1.6 mg/dL — AB (ref 1.7–2.4)

## 2016-09-21 MED ORDER — HALOPERIDOL LACTATE 5 MG/ML IJ SOLN
INTRAMUSCULAR | Status: AC
  Administered 2016-09-21: 10 mg via INTRAVENOUS
  Filled 2016-09-21: qty 2

## 2016-09-21 MED ORDER — HALOPERIDOL LACTATE 5 MG/ML IJ SOLN
10.0000 mg | Freq: Once | INTRAMUSCULAR | Status: AC
Start: 2016-09-21 — End: 2016-09-21
  Administered 2016-09-21: 10 mg via INTRAVENOUS

## 2016-09-21 MED ORDER — HALOPERIDOL LACTATE 5 MG/ML IJ SOLN
10.0000 mg | Freq: Once | INTRAMUSCULAR | Status: AC
  Administered 2016-09-21: 10 mg via INTRAVENOUS

## 2016-09-21 MED ORDER — HALOPERIDOL LACTATE 5 MG/ML IJ SOLN
INTRAMUSCULAR | Status: AC
  Filled 2016-09-21: qty 2

## 2016-09-21 MED ORDER — VALPROATE SODIUM 500 MG/5ML IV SOLN
250.0000 mg | Freq: Two times a day (BID) | INTRAVENOUS | Status: DC
  Filled 2016-09-21 (×2): qty 2.5

## 2016-09-21 MED ORDER — VALPROATE SODIUM 500 MG/5ML IV SOLN
750.0000 mg | Freq: Two times a day (BID) | INTRAVENOUS | Status: DC
  Administered 2016-09-21: 750 mg via INTRAVENOUS
  Filled 2016-09-21 (×2): qty 7.5

## 2016-09-21 NOTE — Progress Notes (Signed)
eLink Physician-Brief Progress Note Patient Name: Brandon BrennerDavid Silva DOB: 1961-08-28 MRN: [id number]   Date of Service  09/21/2016  HPI/Events of Note  Severe agitation on precedex.  Had received ativan and haldol 2mg  with no improvement.  eICU Interventions  Plan: Increase precedex to 1.7 Haldol 10 mg IV now Nurse to [facility] if patient remains agitated     Intervention Category Major Interventions: Delirium, psychosis, severe agitation - evaluation and management  [name] 09/21/2016, 1:08 AM

## 2016-09-21 NOTE — Progress Notes (Signed)
PULMONARY / CRITICAL CARE MEDICINE   Name: Brandon Silva MRN: [id number] DOB: 03/25/1961    ADMISSION DATE:  09/17/2016 CONSULTATION DATE:  09/17/16  REFERRING MD:  Roseanne Reno - Neuro  CHIEF COMPLAINT:  Seizures  BRIEF  Pt is encephelopathic; therefore, this HPI is obtained from chart review. Brandon Silva is a 55 y.o. male with PMH as outlined below. He was brought to [facility] ED 9/20 with seizure activity.  He apparently called EMS just after midnight and stated that he did not feel well.  On EMS arrival, pt was on the floor of his home with slurred speech and reported right sided weakness.  En route to ED, he had witnessed seizure that lasted several minutes.  He had additional seizure activity after arrival to [facility] ED.  He was treated with midazolam, diazepam en route to ED, and once in ED, received lorazepam, propofol, and keppra.  He was then intubated for airway protection. He reportedly has hx of poor compliance with anticonvulsant medication and also has hx of chronic ETOH use.  CT scan of the head was negative.     STUDIES:  CT Head/C-Spine 9/20:No mass or acute infarct. No evidence of fracture or trauma. EEG 9/20: Markedly abnormal due to diffuse background suppression and lack of EEG reactivity with stimulation. Could be secondary to sedation or toxic/metabolic encephalopathy. Port CXR 9/21:  Endotracheal tube in good position. Rotation to the right with elevation of left hemidiaphragm. No focal opacity.  MICROBIOLOGY: MRSA PCR 9/20:  Negative   ANTIBIOTICS: None.  LINES/TUBES: OETT 7.5 9/20 >>09/20/16 OGT 9/20 >> Foley 9/20 >> PIV x2   SIGNIFICANT EVENTS: 9/20 - admitted with seizure activity, required intubation for airway protection. 09/19/16 - : Patient had no more visible focal events overnight. He was successfully transitioned off propofol over to Precedex and Versed infusions. Patient more awake this morning but still not following commands with significant oral secretions.  No acute events overnight.   09/20/16 -  MRI normal. sister and mom at bedside: they says that naturally at hospital patient is agitated and so they do not think agitation should keep him in vent. Also they do not want prolonged vent support if it got to that. Currently on percedex and versed gtt    SUBJECTIVE/OVERNIGHT/INTERVAL HX 09/21/16 - extubated yesterday while on prcedex gtt. Keppra added by neuro. Since then intermittent agitation, verbal, wanting to sign out ama,  abusiveness to staff, pulling out restrains -> continue precedex + benzo prn + haldol prn + sitter + valproic p- >t h is aM drowsy  VITAL SIGNS: BP (!) 160/75 (BP Location: Left Arm)   Pulse (!) 51   Temp 98.4 F (36.9 C) (Axillary)   Resp (!) 27   Ht 6\' 2"  (1.88 m)   Wt 104.2 kg (229 lb 11.5 oz)   SpO2 97%   BMI 29.49 kg/m   HEMODYNAMICS:    VENTILATOR SETTINGS: Vent Mode: PSV;CPAP FiO2 (%):  [40 %] 40 % PEEP:  [5 cmH20] 5 cmH20 Pressure Support:  [5 cmH20] 5 cmH20  INTAKE / OUTPUT: I/O last 3 completed shifts: In: 5480.6 [I.V.:3579; NG/GT:921.7; IV Piggyback:980] Out: 4125 [Urine:4125]   PHYSICAL EXAMINATION: General: No distress. No family at bedside. Eyes open. Neuro: RASS -2 - appears to be off precedex gtt HEENT: Mild exophthalmos. HARd COLLAR ON. Cardiovascular: Regular rhythm. No edema. No appreciable JVD. Lungs: Clear on auscultation bilaterally. Symmetric chest rise on ventilator. Abdomen: Soft. Protuberant. Normal bowel sounds. Musculoskeletal: No joint deformity or effusion appreciated.  Cervical collar in place. Skin: Warm and dry. No rash on exposed skin.  LABS:  PULMONARY  Recent Labs Lab 09/17/16 0239 09/17/16 0606  PHART  --  7.357  PCO2ART  --  39.0  PO2ART  --  481.0*  HCO3  --  21.8  TCO2 23 23  O2SAT  --  100.0    CBC  Recent Labs Lab 09/18/16 0336 09/19/16 0318 09/20/16 0223  HGB 13.1 13.6 13.3  HCT 39.1 41.6 41.7  WBC 6.8 8.4 10.4  PLT 203 201 177     COAGULATION  Recent Labs Lab 09/17/16 0232  INR 1.16    CARDIAC    Recent Labs Lab 09/17/16 0530 09/17/16 0739  TROPONINI <0.03 <0.03   No results for input(s): PROBNP in the last 168 hours.   CHEMISTRY  Recent Labs Lab 09/17/16 0232 09/17/16 0239  09/17/16 0530 09/17/16 1002 09/17/16 1704 09/18/16 0336 09/18/16 1743 09/19/16 0318 09/20/16 0223  NA 138 140  --  138  --   --  138  --  138 139  K 3.7 3.8  --  3.7  --   --  3.8  --  3.5 3.7  CL 105 105  --  106  --   --  107  --  108 107  CO2 21*  --   --  19*  --   --  23  --  21* 22  GLUCOSE 130* 130*  --  142*  --   --  130*  --  158* 181*  BUN 12 13  --  12  --   --  13  --  11 11  CREATININE 1.31* 1.60*  --  1.19  --   --  1.12  --  1.12 0.99  CALCIUM 9.1  --   --  8.5*  --   --  8.4*  --  8.4* 8.7*  MG  --   --   < > 1.9 1.9 2.1 2.2 2.1  --  1.7  PHOS  --   --   < > 3.4 2.6 2.7 2.8 2.2*  --  3.3  < > = values in this interval not displayed. Estimated Creatinine Clearance: 112.3 mL/min (by C-G formula based on SCr of 0.99 mg/dL).   LIVER  Recent Labs Lab 09/17/16 0232 09/18/16 0336 09/20/16 0223  AST 31 24  --   ALT 20 17  --   ALKPHOS 80 76  --   BILITOT 0.7 0.9  --   PROT 7.4 6.1*  --   ALBUMIN 4.2 3.2* 2.7*  INR 1.16  --   --      INFECTIOUS  Recent Labs Lab 09/17/16 0530 09/17/16 0739  LATICACIDVEN 3.1* 3.2*     ENDOCRINE CBG (last 3)   Recent Labs  09/20/16 2338 09/21/16 0353 09/21/16 0718  GLUCAP 164* 101* 148*         IMAGING x48h  - image(s) personally visualized  -   highlighted in bold Mr Brain W Wo Contrast  Result Date: 09/20/2016 CLINICAL DATA:  Encephalopathy, seizures, slurred speech and RIGHT-sided weakness. History of alcohol abuse and diabetes. EXAM: MRI HEAD WITHOUT AND WITH CONTRAST TECHNIQUE: Multiplanar, multiecho pulse sequences of the brain and surrounding structures were obtained without and with intravenous contrast. CONTRAST:  20mL  MULTIHANCE GADOBENATE DIMEGLUMINE 529 MG/ML IV SOLN COMPARISON:  CT HEAD September 2017 and MRI of the head November 25, 2010 FINDINGS: INTRACRANIAL CONTENTS: No reduced diffusion to suggest acute  ischemia. No susceptibility artifact to suggest hemorrhage. The ventricles and sulci are normal for patient's age. No suspicious parenchymal signal, mass lesions, mass effect. No abnormal intraparenchymal or extra-axial enhancement. No abnormal extra-axial fluid collections. No extra-axial masses. Normal major intracranial vascular flow voids present at skull base. Hippocampi demonstrate symmetric size, morphology and signal. ORBITS: Worsening prominent orbital fat, exophthalmos. The extra-ocular muscles do not appear enlarged though, not tailored for evaluation. Lacrimal glands are displaced, partially outside the bony orbit SINUSES: The mastoid air-cells and included paranasal sinuses are well-aerated. SKULL/SOFT TISSUES: No abnormal sellar expansion. No suspicious calvarial bone marrow signal. Craniocervical junction maintained. Life-support lines in place. IMPRESSION: Negative MRI head with and without contrast. 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 Electronically Signed   By: Awilda Metro M.D.   On: 09/20/2016 03:39     ASSESSMENT / PLAN:  NEUROLOGIC A:   Acute Encephalopathy - Likely due to post-ictal state. Seizures - Poor compliance w/ AEDs. Questionable seizure overnight. H/O EtOH Use  - Overnight agitated. Now drowsy and all sedation  Gtt stopped  P:   Continue C -collar - dc when awake and denies c-spine tenderness RASS goal: 0 to -1 Ddc Precedex gtt AED's per Neuro:  Keppra change to depakote 750mg  bid (dc/w Dr Petra Kuba) Ativan IV prn Seizure Haldol prn CIWA with Thiamine & FA IV daily Seizure Precautions   PULMONARY A: Acute Respiratory Failure - s/p extubation 09/20/16  P:   monitor  CARDIOVASCULAR A:  No acute  issues.  P:  Monitoring vitals per unit protocol Continuous Telemetry Monitoring  RENAL A:   Acute Renal Failure - Resolving.    - awaiting 09/21/16 labs  P:   - d5 half at 50cc./h - monitor  GASTROINTESTINAL A:   Chronic EtOH use  P:   NPO Protonix VT daily Hold TF 09/20/16 for possible extubation   HEMATOLOGIC A:   No acute issues.  P:  Trending cell counts daily w/ CBC SCDs Heparin [place] q8hr  INFECTIOUS A:   No acute infection.  P:   Monitor for signs or symptoms  ENDOCRINE A:   H/O DM - Glucose controlled.  P:   Accu-Checks q4hr SSI per Moderate Algorithm   MUSCULOSKELETAL A: Unwitnessed Fall - At home prior to EMS arrival. No fracture on CT.  P: Continuing C-collar - can dc if awake and denies c spine tenderness (moves all 4s)  Family updated: None available 9/22., 09/21/16  Interdisciplinary Family Meeting v Palliative Care Meeting:  Due by: 9/27. - done 09/20/16      TODAY'S SUMMARY:  At risk for AMA - keep in ICU. High risk fo agitated encepha and reintubation      The patient is critically ill with multiple organ systems failure and requires high complexity decision making for assessment and support, frequent evaluation and titration of therapies, application of advanced monitoring technologies and extensive interpretation of multiple databases.   Critical Care Time devoted to patient care services described in this note is  30  Minutes. This time reflects time of care of this signee Dr Kalman Shan. This critical care time does not reflect procedure time, or teaching time or supervisory time of PA/NP/Med student/Med Resident etc but could involve care discussion time    Dr. Kalman Shan, M.D., [facility].C.P Pulmonary and Critical Care Medicine Staff Physician [place] [facility] [place] [facility]monary and Critical Care Pager: [phone], If no answer or between  15:00h - 7:00h: call [phone]  09/21/2016 9:46 AM

## 2016-09-21 NOTE — Progress Notes (Signed)
Spoke with E Link MD regarding patient agitation, attempts to get out of bed, straining against restraints. Ordered to give ativan early.

## 2016-09-21 NOTE — Progress Notes (Signed)
eLink Physician-Brief Progress Note Patient Name: Brandon BrennerDavid Silva DOB: 04/23/1961 MRN: [id number]   Date of Service  09/21/2016  HPI/Events of Note  [facility].  Patient is restrained and has repeatedly attempting getting OOB.  Apparently baseline confusion/agitation.  On precedex and ativan.  Has received total of 23 mg of haldol with some impact on camera re-check but continued concerns expressed by bedside staff regarding patient safety.  At no time have I observed the patient aggressive or striking out at staff.  They have not reported this behavior.  eICU Interventions  Plan: Continue with precedex and PRN ativan Scheduled Valproic acid IV Sitter to bedside for patient redirection     Intervention Category Major Interventions: Delirium, psychosis, severe agitation - evaluation and management  DETERDING,ELIZABETH 09/21/2016, 6:09 AM

## 2016-09-21 NOTE — Progress Notes (Addendum)
Subjective: Intensely agitated following extubation yesterday. Received a total of 22mg  haldol and is on precedex at 1.7 mcg/kg/hr  Per girlfriend, he gets like this in the hospital. He was awake and conversant yesterday before getting agitated over the fact that his animals did not have anyone to feed them.   Exam: Vitals:   09/21/16 0900 09/21/16 1000  BP: (!) 155/75 (!) 162/83  Pulse: (!) 47 (!) 49  Resp: (!) 23 (!) 24  Temp:     Gen: In bed, NAD Resp: non-labored breathing, no acute distress Abd: soft, nt  Neuro: MS: Follows commands, though is very sedated, doe snot talk much.  CN: Pupils equal round and reactive, fixates and tracks Motor: Moves all extremities well Sensory: Responds to stimuli in all 4 extremities  Pertinent Labs: BMP-unremarkable  Impression: 55 year old male with a history of substance abuse and seizure disorder with noncompliance who presented with seizures. I'm concerned that his current level of agitation may have some contribution from on call withdrawal and therefore I will order CIWA protocol after extubation.  I agree with changing keppra to depakote as keppra can worsen agitation.   Recommendations: 1) Depakote 750mg  BID 2) d/c keppra 3) CIWA 4) hold sedation, if he becomes cognizant and is competent, then could leave AMA, but would need to be confidant that he is competent first.   Ritta SlotMcNeill Beauty Pless, MD [facility] [phone]  If 7pm- 7am, please page neurology on call as listed in AMION.  Called regarding patient's desire to leave AMA. I discussed with him that there was risk to him leaving, and he was aware of this. He was awake, alert, appeared appropriate. I removed his C-collar given that he is leaving anyway, and he has no pain to palpation or with flexion/extension. I encouraged him to take his home anti-epileptic (which he assures me that he already has a prescription for).    At this time, I don't think that I  could justify keeping him against his clear wishes to leave AMA.   Ritta SlotMcNeill Ceasia Elwell, MD [facility] [phone]  If 7pm- 7am, please page neurology on call as listed in AMION.

## 2016-09-21 NOTE — Progress Notes (Signed)
After talking to Dr Amada JupiterKirkpatrick pt still insists on leaving AMA. Midline IV removed by IV team and pt given paper scrubs. Dr Marchelle Gearingamaswamy notified of pt wishes. AMA paperwork signed and pt left room. Mother updated as her son's wishes of no longer wanting to receive care and has left the unit.

## 2016-09-24 LAB — CULTURE, BLOOD (ROUTINE X 2)
Culture: NO GROWTH
Culture: NO GROWTH

## 2016-09-26 ENCOUNTER — Encounter: Payer: Self-pay | Admitting: Medical Oncology

## 2016-09-26 ENCOUNTER — Emergency Department
Admission: EM | Admit: 2016-09-26 | Discharge: 2016-09-26 | Disposition: A | Discharge status: Home / Self Care | Payer: Self-pay | Attending: Emergency Medicine | Admitting: Emergency Medicine

## 2016-09-26 ENCOUNTER — Emergency Department: Payer: Self-pay

## 2016-09-26 DIAGNOSIS — F17210 Nicotine dependence, cigarettes, uncomplicated: Secondary | ICD-10-CM | POA: Insufficient documentation

## 2016-09-26 DIAGNOSIS — M79641 Pain in right hand: Secondary | ICD-10-CM | POA: Insufficient documentation

## 2016-09-26 DIAGNOSIS — E119 Type 2 diabetes mellitus without complications: Secondary | ICD-10-CM | POA: Insufficient documentation

## 2016-09-26 DIAGNOSIS — Z79899 Other long term (current) drug therapy: Secondary | ICD-10-CM | POA: Insufficient documentation

## 2016-09-26 DIAGNOSIS — Z760 Encounter for issue of repeat prescription: Secondary | ICD-10-CM | POA: Insufficient documentation

## 2016-09-26 LAB — GLUCOSE, CAPILLARY: Glucose-Capillary: 209 mg/dL — ABNORMAL HIGH (ref 65–99)

## 2016-09-26 MED ORDER — PHENYTOIN SODIUM EXTENDED 100 MG PO CAPS: 100.0000 mg | ORAL_CAPSULE | Freq: Three times a day (TID) | ORAL | Status: DC

## 2016-09-26 MED ORDER — DIVALPROEX SODIUM 500 MG PO DR TAB: 500.0000 mg | DELAYED_RELEASE_TABLET | Freq: Three times a day (TID) | ORAL | 0 refills | Status: DC

## 2016-09-26 MED ORDER — DIVALPROEX SODIUM 500 MG PO DR TAB
750.0000 mg | DELAYED_RELEASE_TABLET | Freq: Once | ORAL | Status: AC
  Administered 2016-09-26: 750 mg via ORAL
  Filled 2016-09-26 (×2): qty 1

## 2016-09-26 NOTE — ED Notes (Signed)
Pt declined splint "I just need to go can you just get the meds?"

## 2016-09-26 NOTE — Progress Notes (Signed)
LCSW consulted with EDP and patient is being released back into the community. He reports that he has no drinking issue now just he needs his medications and has no monies to pay for it. LCSW provided [employer] resource handout from the [facility] and highlighted and reviewed emergency numbers for patient to access medication from either DSS office of Medication clinic. If both these establishments he can return to his long standing pharmacists and request 2 days of medication until funds can be provided. He was encouraged to advocate for himself.  He reports he was a serious drinker. This worker provided him a [facility] hand out and encouraged him to visit both near the DSS/ [place] locations and consider 1-1 therapeutic counseling. He is in the process of applying for medicaid and SS application pending.  Patient is agreeable to access resources as he needs. No further needs at this time.   Delta Air LinesClaudine Kani Jobson LCSW [phone]

## 2016-09-26 NOTE — ED Notes (Signed)
Patient is c/o pain in his right hand. Patient also needs his medications refilled.

## 2016-09-26 NOTE — ED Triage Notes (Signed)
Pt reports that he was discharged from medication management and since then (4-5 days) pt has been out of seizure medication. Pt denies symptoms at this time.

## 2016-09-26 NOTE — ED Provider Notes (Signed)
[facility] Emergency Department Provider Note  Time seen: 4:35 PM  I have reviewed the triage vital signs and the nursing notes.   HISTORY  Chief Complaint Medication Refill    HPI Brandon Silva is a 55 y.o. male with a past medical history of diabetes, alcohol abuse, seizure disorder presents the emergency department hoping to get refills of his seizure medications. According to the patient and record review the patient was admitted to [facility] 09/17/16 after a seizure. Patient was treated in the intensive care unit. Patient states he awoke in the intensive care unit, did not like how he is being treated so he left AGAINST MEDICAL ADVICE. He states he is supposed be taking Dilantin, but does not have a prescription for this medication. He states this last seizure was very serious and it scares him to be off his seizure medications. Patient does not have any money to get his medications filled. Patient states he is also taking insulin for his diabetes but he is almost out of the insulin as well and does not have the money to get the insulin filled. As a secondary complaint the patient is also complaining of right hand pain ever since his admission to school. Patient states mild swelling and pain with movement of the right hand denies any known trauma but states he did lose consciousness when he had a seizure and does not know if he hit it.Describes the hand pain as moderate worse with movement.    Past Medical History:  Diagnosis Date  . Alcohol abuse   . Diabetes mellitus without complication (HCC)   . Seizures [facility])     Patient Active Problem List   Diagnosis Date Noted  . Acute respiratory failure (HCC) 09/20/2016  . Seizures (HCC) 09/17/2016  . Ventilator dependence (HCC)   . Hypocalcemia   . AKI (acute kidney injury) (HCC)   . Acute encephalopathy   . ETOH abuse   . Uncontrolled type 2 diabetes mellitus with ketoacidosis without coma, without  long-term current use of insulin (HCC)   . Seizure (HCC) 02/04/2016    History reviewed. No pertinent surgical history.  Prior to Admission medications   Medication Sig Start Date End Date Taking? Authorizing Provider  acetaminophen (TYLENOL) 325 MG tablet Take 325-650 mg by mouth every 6 (six) hours as needed for headache (or pain).    Historical Provider, MD    Allergies  Allergen Reactions  . Penicillins Rash    Has patient had a PCN reaction causing immediate rash, facial/tongue/throat swelling, SOB or lightheadedness with hypotension: Yes Has patient had a PCN reaction causing severe rash involving mucus membranes or skin necrosis: No Has patient had a PCN reaction that required hospitalization: No Has patient had a PCN reaction occurring within the last 10 years: No If all of the above answers are "NO", then may proceed with Cephalosporin use.     Family History  Problem Relation Age of Onset  . Diabetes Other     Social History Social History  Substance Use Topics  . Smoking status: Current Every Day Smoker    Types: Cigarettes  . Smokeless tobacco: Never Used  . Alcohol use Yes    Review of Systems Constitutional: Negative for fever. Cardiovascular: Negative for chest pain. Respiratory: Negative for shortness of breath. Gastrointestinal: Negative for abdominal pain Musculoskeletal:Right hand pain  Neurological: Negative for headache 10-point ROS otherwise negative.  ____________________________________________   PHYSICAL EXAM:  VITAL SIGNS: ED Triage Vitals  Enc  Vitals Group     BP 09/26/16 1516 (!) 142/80     Pulse Rate 09/26/16 1516 88     Resp 09/26/16 1516 18     Temp 09/26/16 1516 98.8 F (37.1 C)     Temp Source 09/26/16 1516 Oral     SpO2 09/26/16 1516 98 %     Weight 09/26/16 1517 229 lb (103.9 kg)     Height 09/26/16 1517 6\' 2"  (1.88 m)     Head Circumference --      Peak Flow --      Pain Score 09/26/16 1517 0     Pain Loc --       Pain Edu? --      Excl. in GC? --     Constitutional: Alert and oriented. Well appearing and in no distress. Eyes: Normal exam ENT   Head: Normocephalic and atraumatic.   Mouth/Throat: Mucous membranes are moist. Cardiovascular: Normal rate, regular rhythm. No murmur Respiratory: Normal respiratory effort without tachypnea nor retractions. Breath sounds are clear  Gastrointestinal: Soft and nontender. No distention.   Musculoskeletal:Moderate tenderness around the right wrist and dorsal aspect of the right hand with mild edema. Neurovascular intact.  Neurologic:  Normal speech and language. No gross focal neurologic deficits Skin:  Skin is warm, dry and intact.  Psychiatric: Mood and affect are normal. Speech and behavior are normal.       RADIOLOGY  X-rays negative for fracture.  ____________________________________________   INITIAL IMPRESSION / ASSESSMENT AND PLAN / ED COURSE  Pertinent labs & imaging results that were available during my care of the patient were reviewed by me and considered in my medical decision making (see chart for details).  The patient presents the emergency department hoping for help in obtaining medications. Patient states he has not had any Dilantin since leaving the hospital 4 days ago. It does not have prescriptions for the medication. He does not have money to fill a prescription even if we gave it to him. After consult social work to see what if any options are available for this patient.   Patient's right hand x-ray is negative. It is not clear what is causing the pain and swelling in the right hand whether this was due to an IV infiltrating, or trauma during the seizure. We'll place in a Velcro removable wrist splint.   Currently working with the social worker to see if there is any way that we can have pharmacy prescribe one week of Depakote for the patient. Patient was supposed be discharged on Depakote 750 mg twice a day according to  record review. We will dose Depakote in the emergency department. We'll place the patient in the right wrist splint. I discussed with the patient that we are attempting to have pharmacy provide a week worth of his medications until we can get him established with medication management. Patient is appreciated but states he needs to leave the emergency department now and cannot wait any longer. I discussed with the patient we are unable to provide him medication at this time until we can have pharmacy approval, patient states he understands and he wishes to leave the emergency department now. He is agreeable to at least stay until we can dose of his medication in the emergency department, but then states he wishes to leave. ____________________________________________   FINAL CLINICAL IMPRESSION(S) / ED DIAGNOSES  Medication refill Right hand pain    Minna AntisKevin Mishayla Sliwinski, MD 09/26/16 1656

## 2016-09-27 ENCOUNTER — Telehealth: Payer: Self-pay | Admitting: Emergency Medicine

## 2016-09-27 NOTE — Telephone Encounter (Signed)
Pt called reporting that he cannot afford his seizure medication and that the social worker told him it was $4. He had this RN speak to pharmacist while he was there; nothing for seizure really comparable or cheaper per pharmacist.  Requested she inform pt that hospital was going to give him some of the med but he left so RN will speak to doctor but not sure if a different prescription can be given. Per EDP notes from yesterday CSW was arranging to have 1 weeks worth of medication filled here so that he had time to get to medication management to fill the rest but pt refused to wait for the medication. Spoke with Dr. Pershing ProudSchaevitz today and no other meds are really cheaper where he can come exchange prescription. Attempted to call pt X 3 with no answer. Called pharmacist and told her when pt comes back (she reports he is filling 5 days of medication and then will go to medication management) to let him know per MD this is med he needs and if he needs dose/has seizure to come to ED and check back in. She will relay message.

## 2016-10-09 NOTE — Discharge Summary (Signed)
DISCHARGE SUMMARY    Date of admit: 09/17/2016  2:29 AM Date of discharge: 09/21/2016  3:13 PM Length of Stay: 4 days  PCP is No PCP Per Patient   PROBLEM LIST Active Problems:   Seizures (HCC)   Ventilator dependence (HCC)   AKI (acute kidney injury) (HCC)   Acute encephalopathy   Acute respiratory failure (HCC)    SUMMARY Brandon Silva was 55 y.o. patient with Brandon Silva is a 55 y.o. male with PMH as outlined below. [facility] with seizure activity.  He apparently called EMS just after midnight and stated that he did not feel well.  On EMS arrival, pt was on the floor of his home with slurred speech and reported right sided weakness.  En route to ED, he had witnessed seizure that lasted several minutes.  He had additional seizure activity [facility]  He was treated with midazolam, diazepam en route to ED, and once in ED, received lorazepam, propofol, and keppra.  He was then intubated for airway protection. He reportedly has hx of poor compliance with anticonvulsant medication and also has hx of chronic ETOH use.  CT scan of the head was negative. Admitted 09/17/2016    has a past medical history of Alcohol abuse; Diabetes mellitus without complication (HCC); and Seizures (HCC).   has no past surgical history on file.  SIGNIFICANT EVENTS: 9/20 - admitted with seizure activity, required intubation for airway protection. 09/19/16 - : Patient had no more visible focal events overnight. He was successfully transitioned off propofol over to Precedex and Versed infusions. Patient more awake this morning but still not following commands with significant oral secretions. No acute events overnight.   09/20/16 -  MRI normal. sister and mom at bedside: they says that naturally at hospital patient is agitated and so they do not think agitation should keep him in vent. Also they do not want prolonged vent support if it got to that. Currently on percedex  and versed gtt    09/21/16 - extubated yesterday while on prcedex gtt. Keppra added by neuro. Since then intermittent agitation, verbal, wanting to sign out ama,  abusiveness to staff, pulling out restrains -> continue precedex + benzo prn + haldol prn + sitter + valproic p- >t h is aM drowsy. Neuro noted same thing and obtained history from family which PCCM did as well that patient is like this in the hospital  LAter in the day he was deemed to have full capacity and signed himself out AMA    SIGNED Dr. Kalman ShanMurali Sholonda Jobst, M.D., [facility] Pulmonary and Critical Care Medicine Staff Physician [facility] Pager: [phone], If no answer or between  15:00h - 7:00h: call [phone]  10/09/2016 4:45 AM

## 2017-03-12 ENCOUNTER — Encounter: Payer: Self-pay | Admitting: Emergency Medicine

## 2017-03-12 ENCOUNTER — Emergency Department
Admission: EM | Admit: 2017-03-12 | Discharge: 2017-03-12 | Disposition: A | Discharge status: Home / Self Care | Payer: Self-pay | Attending: Emergency Medicine | Admitting: Emergency Medicine

## 2017-03-12 DIAGNOSIS — E119 Type 2 diabetes mellitus without complications: Secondary | ICD-10-CM | POA: Insufficient documentation

## 2017-03-12 DIAGNOSIS — F17210 Nicotine dependence, cigarettes, uncomplicated: Secondary | ICD-10-CM | POA: Insufficient documentation

## 2017-03-12 DIAGNOSIS — Z760 Encounter for issue of repeat prescription: Secondary | ICD-10-CM | POA: Insufficient documentation

## 2017-03-12 DIAGNOSIS — Z79899 Other long term (current) drug therapy: Secondary | ICD-10-CM | POA: Insufficient documentation

## 2017-03-12 DIAGNOSIS — Z9114 Patient's other noncompliance with medication regimen: Secondary | ICD-10-CM | POA: Insufficient documentation

## 2017-03-12 LAB — GLUCOSE, CAPILLARY: GLUCOSE-CAPILLARY: 141 mg/dL — AB (ref 65–99)

## 2017-03-12 MED ORDER — INSULIN NPH ISOPHANE & REGULAR (70-30) 100 UNIT/ML ~~LOC~~ SUSP: SUBCUTANEOUS | 0 refills | Status: DC

## 2017-03-12 MED ORDER — DIVALPROEX SODIUM 500 MG PO DR TAB: 500.0000 mg | DELAYED_RELEASE_TABLET | Freq: Three times a day (TID) | ORAL | 0 refills | Status: DC

## 2017-03-12 MED ORDER — VALPROIC ACID 250 MG PO CAPS
500.0000 mg | ORAL_CAPSULE | Freq: Once | ORAL | Status: AC
  Administered 2017-03-12: 500 mg via ORAL
  Filled 2017-03-12: qty 2

## 2017-03-12 MED ORDER — DIVALPROEX SODIUM 500 MG PO DR TAB
DELAYED_RELEASE_TABLET | ORAL | Status: AC
  Filled 2017-03-12: qty 1

## 2017-03-12 NOTE — ED Triage Notes (Signed)
Patient presents to the ED and states, "I've been out of my depakote and my insulin Novolin 70/30" x 2 weeks.  Patient given information regarding [facility] clinic and [facility] by this RN.  Patient states, "when I take the depakote, it doesn't completely stop my seizures but I have them a lot less."  Patient reports having a seizure on Monday or Tuesday.  Patient is alert and oriented x 4, ambulatory to triage and is in no obvious distress at this time.

## 2017-03-12 NOTE — ED Provider Notes (Signed)
[facility] Emergency Department Provider Note  ____________________________________________   First MD Initiated Contact with Patient 03/12/17 1244     (approximate)  I have reviewed the triage vital signs and the nursing notes.   HISTORY  Chief Complaint Medication Refill    HPI Brandon BrennerDavid Silva is a 56 y.o. male is here to get a refill of his medication. Patient states he has not had a primary care doctor. He [facility] however he has [facility] issues and also because [facility] be [facility].He states that he ran out of his insulin 70/30 approximately 2 weeks ago. Patient does not check his blood sugars at home. He has no idea what his pressure is up and running. Patient also has been out of his Depakote for approximately 3-4 days. He also does not have a primary care doctor and comes to the emergency room when he feels the need for refills for his prescriptions. Patient is here today because mother drove him. Patient denies any pain.   Past Medical History:  Diagnosis Date  . Alcohol abuse   . Diabetes mellitus without complication (HCC)   . Seizures [facility](HCC)     Patient Active Problem List   Diagnosis Date Noted  . Acute respiratory failure (HCC) 09/20/2016  . Seizures (HCC) 09/17/2016  . Ventilator dependence (HCC)   . Hypocalcemia   . AKI (acute kidney injury) (HCC)   . Acute encephalopathy   . ETOH abuse   . Uncontrolled type 2 diabetes mellitus with ketoacidosis without coma, without long-term current use of insulin (HCC)   . Seizure (HCC) 02/04/2016    History reviewed. No pertinent surgical history.  Prior to Admission medications   Medication Sig Start Date End Date Taking? Authorizing Provider  acetaminophen (TYLENOL) 325 MG tablet Take 325-650 mg by mouth every 6 (six) hours as needed for headache (or pain).    Historical Provider, MD  divalproex (DEPAKOTE) 500 MG DR tablet Take 1 tablet (500 mg  total) by mouth 3 (three) times daily. 03/12/17   Tommi Rumpshonda L Summers, PA-C  insulin NPH-regular Human (NOVOLIN 70/30) (70-30) 100 UNIT/ML injection As directed 03/12/17   Tommi Rumpshonda L Summers, PA-C    Allergies Penicillins  Family History  Problem Relation Age of Onset  . Diabetes Other     Social History Social History  Substance Use Topics  . Smoking status: Current Every Day Smoker    Types: Cigarettes  . Smokeless tobacco: Never Used  . Alcohol use Yes    Review of Systems Constitutional: No fever/chills Eyes: Positive chronic blurred vision. ENT: No sore throat. Cardiovascular: Denies chest pain. Respiratory: Denies shortness of breath. Gastrointestinal: No abdominal pain.  No nausea, no vomiting.  No diarrhea.   Genitourinary: Negative for dysuria. Musculoskeletal: Negative for back pain. Skin: Negative for rash. Neurological: Negative for headaches, focal weakness or numbness. Psychiatric:Positive alcohol abuse. Positive cigarette use. Endocrine:Positive diabetes. Allergic/Immunilogical: Allergy to penicillins. 10-point ROS otherwise negative.  ____________________________________________   PHYSICAL EXAM:  VITAL SIGNS: ED Triage Vitals  Enc Vitals Group     BP 03/12/17 1210 (!) 145/92     Pulse Rate 03/12/17 1210 74     Resp 03/12/17 1210 18     Temp 03/12/17 1210 97.9 F (36.6 C)     Temp Source 03/12/17 1210 Oral     SpO2 03/12/17 1210 100 %     Weight 03/12/17 1211 200 lb (90.7 kg)  Height 03/12/17 1211 6\' 4"  (1.93 m)     Head Circumference --      Peak Flow --      Pain Score 03/12/17 1211 0     Pain Loc --      Pain Edu? --      Excl. in GC? --     Constitutional: Alert and oriented. Well appearing and in no acute distress. Eyes: Conjunctivae are normal. PERRL. EOMI. Head: Atraumatic. Nose: No congestion/rhinnorhea. Neck: No stridor.   Cardiovascular: Normal rate, regular rhythm. Grossly normal heart sounds.  Good peripheral  circulation. Respiratory: Normal respiratory effort.  No retractions. Lungs CTAB. Gastrointestinal: Soft and nontender. No distention. Musculoskeletal: No lower extremity tenderness nor edema.  No joint effusions. Neurologic:  Normal speech and language. No gross focal neurologic deficits are appreciated. No gait instability. Skin:  Skin is warm, dry and intact. No rash noted. Psychiatric: Mood and affect are normal. Speech and behavior are normal.  ____________________________________________   LABS (all labs ordered are listed, but only abnormal results are displayed)  Labs Reviewed  GLUCOSE, CAPILLARY - Abnormal; Notable for the following:       Result Value   Glucose-Capillary 141 (*)    All other components within normal limits    PROCEDURES  Procedure(s) performed: None  Procedures  Critical Care performed: No  ____________________________________________   INITIAL IMPRESSION / ASSESSMENT AND PLAN / ED COURSE  Pertinent labs & imaging results that were available during my care of the patient were reviewed by me and considered in my medical decision making (see chart for details).  Patient was given Depakote in the emergency room along with a prescription for continued use for the next month. She was also given a prescription for his insulin. We discussed multiple options for him however he is unwilling to listen to any of these as "it costs money" he is also unwilling to pay the $20 to be seen at [place] [facility]. We discussed [facility] that is [facility] however he states that this is a transportation problem for him since today's visit he is driven here by his mother.      ____________________________________________   FINAL CLINICAL IMPRESSION(S) / ED DIAGNOSES  Final diagnoses:  Encounter for medication refill  Non compliance w medication regimen      NEW MEDICATIONS STARTED DURING THIS VISIT:  Discharge Medication List as of 03/12/2017  1:29 PM    START  taking these medications   Details  insulin NPH-regular Human (NOVOLIN 70/30) (70-30) 100 UNIT/ML injection As directed, Print         Note:  This document was prepared using Dragon voice recognition software and may include unintentional dictation errors.    Tommi Rumps, PA-C 03/12/17 1525    Governor Rooks, MD 03/12/17 1534

## 2017-03-12 NOTE — Discharge Instructions (Signed)
Blood sugar today in the emergency room was 141. You need to call and make arrangements to [facility]. We discussed the importance of getting a primary care doctor for both your seizure disorder and diabetes care. It is extremely important to have regular maintenance checkups by a primary care physician to avoid complications from your  diabetes.

## 2017-03-12 NOTE — ED Notes (Signed)
FSBS: 141

## 2018-08-26 ENCOUNTER — Other Ambulatory Visit: Payer: Self-pay

## 2018-08-26 ENCOUNTER — Emergency Department: Payer: Self-pay

## 2018-08-26 ENCOUNTER — Emergency Department
Admission: EM | Admit: 2018-08-26 | Discharge: 2018-08-26 | Discharge status: Against Medical Advice | Payer: Self-pay | Attending: Emergency Medicine | Admitting: Emergency Medicine

## 2018-08-26 DIAGNOSIS — R569 Unspecified convulsions: Secondary | ICD-10-CM | POA: Insufficient documentation

## 2018-08-26 DIAGNOSIS — E119 Type 2 diabetes mellitus without complications: Secondary | ICD-10-CM | POA: Insufficient documentation

## 2018-08-26 DIAGNOSIS — Z794 Long term (current) use of insulin: Secondary | ICD-10-CM | POA: Insufficient documentation

## 2018-08-26 DIAGNOSIS — Z79899 Other long term (current) drug therapy: Secondary | ICD-10-CM | POA: Insufficient documentation

## 2018-08-26 DIAGNOSIS — F17210 Nicotine dependence, cigarettes, uncomplicated: Secondary | ICD-10-CM | POA: Insufficient documentation

## 2018-08-26 MED ORDER — SODIUM CHLORIDE 0.9 % IV BOLUS
1000.0000 mL | Freq: Once | INTRAVENOUS | Status: AC
  Administered 2018-08-26: 1000 mL via INTRAVENOUS

## 2018-08-26 NOTE — Discharge Instructions (Addendum)
You are leaving AGAINST MEDICAL ADVICE.  You did not stay long enough for us to get the results of any lab work or imaging studies.  Please know you can return at any time for additional evaluation.

## 2018-08-26 NOTE — ED Notes (Addendum)
Pt states he does not want to stay, mother at bedside and EDP notified. Pt pulled own IV out, refuses dressing to be applied.

## 2018-08-26 NOTE — ED Notes (Signed)
Patient leaving AMA, plan of care discussed by EDP with mother and patient along with possible consequences of refusing treatment. Patient persistent about going home, left with mother.

## 2018-08-26 NOTE — ED Provider Notes (Signed)
[facility] Emergency Department Provider Note  ____________________________________________   First MD Initiated Contact with Patient 08/26/18 2125     (approximate)  I have reviewed the triage vital signs and the nursing notes.   HISTORY  Chief Complaint Seizures and Alcohol Intoxication  Level 5 caveat:  history/ROS limited by acute/critical illness and/or intoxication  HPI Brandon Silva is a 57 y.o. male with a history of alcohol abuse, diabetes, and seizure disorder who presents by EMS for evaluation after several apparent seizure-like episodes.  Family is not immediately present, but reportedly the patient's mother told EMS that he does not take his seizure medicine and he has been drinking some alcohol today.  He had multiple episodes of generalized shaking.  EMS witnessed several of these and said that they would last a few seconds at a time and initially he was given Versed 2.5 mg IV to try and control the symptoms.  However he continued to be somewhat agitated and would shake various parts of his body or sometimes his entire body but the symptoms were not consistent.  When he arrived to the emergency department he began to shake all over with activity that did not appear consistent with a generalized tonic-clonic seizure.  When we told him to stop so that we could transfer him from the stretcher to the ED bed, he stopped shaking, looked around, and ask where he was.  He will ask simple questions but will not answer questions.  He is speaking with slurred speech and does not appear to be in any distress although he is somewhat agitated.  He did finally say "no" when I asked him if he is in any pain and he also denies shortness of breath but would not answer anything else.    Past Medical History:  Diagnosis Date  . Alcohol abuse   . Diabetes mellitus without complication (HCC)   . Seizures [facility])     Patient Active Problem List   Diagnosis Date Noted    . Acute respiratory failure (HCC) 09/20/2016  . Seizures (HCC) 09/17/2016  . Ventilator dependence (HCC)   . Hypocalcemia   . AKI (acute kidney injury) (HCC)   . Acute encephalopathy   . ETOH abuse   . Uncontrolled type 2 diabetes mellitus with ketoacidosis without coma, without long-term current use of insulin (HCC)   . Seizure (HCC) 02/04/2016    No past surgical history on file.  Prior to Admission medications   Medication Sig Start Date End Date Taking? Authorizing Provider  acetaminophen (TYLENOL) 325 MG tablet Take 325-650 mg by mouth every 6 (six) hours as needed for headache (or pain).    [provider]  divalproex (DEPAKOTE) 500 MG DR tablet Take 1 tablet (500 mg total) by mouth 3 (three) times daily. 03/12/17   Tommi Rumps, PA-C  insulin NPH-regular Human (NOVOLIN 70/30) (70-30) 100 UNIT/ML injection As directed 03/12/17   Tommi Rumps, PA-C    Allergies Penicillins  Family History  Problem Relation Age of Onset  . Diabetes Other     Social History Social History   Tobacco Use  . Smoking status: Current Every Day Smoker    Types: Cigarettes  . Smokeless tobacco: Never Used  Substance Use Topics  . Alcohol use: Yes  . Drug use: No    Review of Systems  Level 5 caveat:  history/ROS limited by acute/critical illness and/or intoxication  ____________________________________________   PHYSICAL EXAM:  VITAL SIGNS: ED Triage Vitals  Enc Vitals Group     BP 08/26/18 2128 (!) 158/94     Pulse Rate 08/26/18 2128 80     Resp 08/26/18 2128 14     Temp 08/26/18 2128 (!) 97.4 F (36.3 C)     Temp Source 08/26/18 2128 Oral     SpO2 08/26/18 2128 97 %     Weight 08/26/18 2128 99.8 kg (220 lb)     Height 08/26/18 2128 1.829 m (6')     Head Circumference --      Peak Flow --      Pain Score 08/26/18 2130 7     Pain Loc --      Pain Edu? --      Excl. in [place]? --     Constitutional: Awake but altered, likely intoxicated, possibly  postictal Eyes: Conjunctivae are normal. PERRL. EOMI. Head: Atraumatic. Nose: No congestion/rhinnorhea. Mouth/Throat: Mucous membranes are moist. Neck: No stridor.  No meningeal signs.   Cardiovascular: Normal rate, regular rhythm. Good peripheral circulation. Grossly normal heart sounds. Respiratory: Normal respiratory effort.  No retractions. Lungs CTAB. Gastrointestinal: Soft and nontender. No distention.  Musculoskeletal: No lower extremity tenderness nor edema. No gross deformities of extremities. Neurologic: Somewhat slurred speech but distinct language.  Moving all 4 extremities without difficulty, no facial droop, but cannot participate in neurological exam. Skin:  Skin is warm, dry and intact. No rash noted.   ____________________________________________   LABS (all labs ordered are listed, but only abnormal results are displayed)  Labs Reviewed - No data to display ____________________________________________  EKG  ED ECG REPORT I, Loleta Rose, the attending physician, personally viewed and interpreted this ECG.  Date: 08/26/2018 EKG Time: 21: 26 Rate: 84 Rhythm: normal sinus rhythm QRS Axis: normal Intervals: normal ST/T Wave abnormalities: normal Narrative Interpretation: no evidence of acute ischemia  ____________________________________________  RADIOLOGY   ED MD interpretation: CT head ordered but patient left AGAINST MEDICAL ADVICE prior to the scan  Official radiology report(s): No results found.  ____________________________________________   PROCEDURES  Critical Care performed: No   Procedure(s) performed:   Procedures   ____________________________________________   INITIAL IMPRESSION / ASSESSMENT AND PLAN / ED COURSE  As part of my medical decision making, I reviewed the following data within the electronic MEDICAL RECORD NUMBER History obtained from family and Nursing notes reviewed and incorporated    Differential diagnosis  includes, but is not limited to, alcohol or drug intoxication, head injury, seizure activity/postictal state, substance-induced mood disorder, personality disorder, adjustment disorder.  I ordered a full gamut of labs as well as a CT head to make sure there is no evidence of any acute intracranial injury.  The "seizure-like" activity that I witnessed when the patient first arrived on a stretcher was not at all consistent with epileptiform seizures and appeared more to be pseudoseizures or factitious disorder.  I was working him up thoroughly and broadly but not commenced based on the presentation that this represented actual seizure activity.  He was hemodynamically stable and in no acute distress upon arrival and the nurses were able to collect blood.  His mother arrived, and shortly thereafter the patient became fully awake and started demanding to leave.  Apparently he had been asking to see me while I was in seeing another patient and then became very angry, was alert and oriented, pulled out his IV, and started the process of getting up, exhibiting angry and aggressive behavior to ED staff, and generally causing a great deal of commotion.  His mother was trying to calm him down but he was very upset and demanding to leave.  I was alerted and entered the room and tried to talk to him and de-escalate the situation.  He was clearly angry and demanding to go, saying that we were not doing anything for him and that he had asked for me and I did not show up right away.  I tried to explain I had 12 other patients for whom I was responsible but he was very angry and felt that I was not giving him adequate attention.  His mother was concerned about him and feels that alcohol is not playing a role and in fact claims now that he had only a little bit to drink even though his behavior would suggest otherwise.  The patient is still slightly slurring his words but was ambulatory and aggressive.  I had to make a very  fast decision about whether or not to escalate the situation further and place him under involuntary commitment or respect his right, though he could potentially be intoxicated, to refuse treatment.  His mother tried to talk him into staying also but he refuses to do so.  I am concerned that escalating the situation to the point he would require physical restraint by police officers and chemical sedation will not be at all beneficial to anyone.  He has no evidence of any gross neurological deficits, he is well known to have alcohol dependence and prior seizures but is noncompliant with his medications, he has a stable vital signs, and he has a relative who is willing and able to take him home even though she would prefer he stay.  I made the decision to not place him under involuntary commitment since he is not endorsing any suicidal ideation nor homicidal ideation and does not represent an acute danger to himself or others.  He also at this time does not appear to have any evidence of acute or emergent medical condition that could potentially be life or limb threatening.    I explained to the patient he is leaving AGAINST MEDICAL ADVICE and that I cannot tell him for sure that he does not have any acute or emergent medical conditions.  He states that he knows this and continue to break myself and the ED staff verbally and until I provided him with discharge paperwork.  I do not believe he was willing to sign any AMA or general discharge documentation although the nurse may have convinced him to do so.  I made it clear to the patient and his mother that he is free to return to the emergency department for further evaluation at any time.  He left the emergency department ambulatory without any difficulty and his mother was going to take him home.      ____________________________________________  FINAL CLINICAL IMPRESSION(S) / ED DIAGNOSES  Final diagnoses:  Seizure-like activity (HCC)     MEDICATIONS  GIVEN DURING THIS VISIT:  Medications  sodium chloride 0.9 % bolus 1,000 mL (0 mLs Intravenous Stopped 08/26/18 2215)     ED Discharge Orders    None       Note:  This document was prepared using Dragon voice recognition software and may include unintentional dictation errors.    Loleta RoseForbach, Alayla Dethlefs, MD 08/27/18 [phone]

## 2018-08-26 NOTE — ED Notes (Signed)
Pt started yelling and stating that he was leaving. Mother at bedside. Pt stated that he wanted his IV out and pt informed that IV would not come out. Patient pulled IV out.

## 2018-08-26 NOTE — ED Notes (Signed)
Pt is being rude and disrespect towards staff, pt keeps saying that he is going home with his mother.

## 2018-08-26 NOTE — ED Triage Notes (Signed)
Pt is brought in via EMS due to seizure like activity. Mother told EMS, pt that has hx of seizures and that he does not take his medication and he has been drinking alcohol. EMS gave patient 2.5mg  of versed. Pt is A&OX2 on arrival, pt keeps saying that he wants to go home.

## 2018-08-26 NOTE — ED Notes (Signed)
Pt left AMA with mother.

## 2019-12-26 ENCOUNTER — Encounter: Payer: Self-pay | Admitting: Emergency Medicine

## 2019-12-26 ENCOUNTER — Emergency Department
Admission: EM | Admit: 2019-12-26 | Discharge: 2019-12-26 | Disposition: A | Discharge status: Home / Self Care | Payer: Medicaid Other | Attending: Emergency Medicine | Admitting: Emergency Medicine

## 2019-12-26 ENCOUNTER — Emergency Department: Payer: Medicaid Other

## 2019-12-26 ENCOUNTER — Other Ambulatory Visit: Payer: Self-pay

## 2019-12-26 DIAGNOSIS — Z760 Encounter for issue of repeat prescription: Secondary | ICD-10-CM | POA: Insufficient documentation

## 2019-12-26 DIAGNOSIS — F17210 Nicotine dependence, cigarettes, uncomplicated: Secondary | ICD-10-CM | POA: Diagnosis not present

## 2019-12-26 DIAGNOSIS — Z79899 Other long term (current) drug therapy: Secondary | ICD-10-CM | POA: Diagnosis not present

## 2019-12-26 DIAGNOSIS — E119 Type 2 diabetes mellitus without complications: Secondary | ICD-10-CM | POA: Insufficient documentation

## 2019-12-26 DIAGNOSIS — Z794 Long term (current) use of insulin: Secondary | ICD-10-CM | POA: Insufficient documentation

## 2019-12-26 DIAGNOSIS — S4991XA Unspecified injury of right shoulder and upper arm, initial encounter: Secondary | ICD-10-CM | POA: Diagnosis not present

## 2019-12-26 DIAGNOSIS — Y929 Unspecified place or not applicable: Secondary | ICD-10-CM | POA: Insufficient documentation

## 2019-12-26 DIAGNOSIS — W19XXXA Unspecified fall, initial encounter: Secondary | ICD-10-CM | POA: Diagnosis not present

## 2019-12-26 DIAGNOSIS — Y999 Unspecified external cause status: Secondary | ICD-10-CM | POA: Diagnosis not present

## 2019-12-26 DIAGNOSIS — Y939 Activity, unspecified: Secondary | ICD-10-CM | POA: Insufficient documentation

## 2019-12-26 MED ORDER — MELOXICAM 15 MG PO TABS: 15.0000 mg | ORAL_TABLET | Freq: Every day | ORAL | 0 refills | Status: DC

## 2019-12-26 MED ORDER — LORAZEPAM 0.5 MG PO TABS: 0.5000 mg | ORAL_TABLET | Freq: Three times a day (TID) | ORAL | 0 refills | Status: DC | PRN

## 2019-12-26 NOTE — ED Provider Notes (Signed)
[facility] Emergency Department Provider Note ____________________________________________  Time seen: Approximately 3:33 PM  I have reviewed the triage vital signs and the nursing notes.   HISTORY  Chief Complaint Shoulder Injury    HPI Brandon Silva is a 57 y.o. male who presents to the emergency department for evaluation and treatment of right shoulder pain after a mechanical fall 2 weeks ago. He is also out of his lorazepam and can not see [facility] until next week. Patient has not attempted any alleviating measures for her shoulder.  Past Medical History:  Diagnosis Date  . Alcohol abuse   . Diabetes mellitus without complication ([place])   . Seizures [facility])     Patient Active Problem List   Diagnosis Date Noted  . Acute respiratory failure ([place]) 09/20/2016  . Seizures ([place]) 09/17/2016  . Ventilator dependence ([place])   . Hypocalcemia   . AKI (acute kidney injury) ([place])   . Acute encephalopathy   . ETOH abuse   . Uncontrolled type 2 diabetes mellitus with ketoacidosis without coma, without long-term current use of insulin ([place])   . Seizure ([place]) 02/04/2016    History reviewed. No pertinent surgical history.  Prior to Admission medications   Medication Sig Start Date End Date Taking? Authorizing Provider  acetaminophen (TYLENOL) 325 MG tablet Take 325-650 mg by mouth every 6 (six) hours as needed for headache (or pain).    [provider]  divalproex (DEPAKOTE) 500 MG DR tablet Take 1 tablet (500 mg total) by mouth 3 (three) times daily. 03/12/17   Johnn Hai, PA-C  insulin NPH-regular Human (NOVOLIN 70/30) (70-30) 100 UNIT/ML injection As directed 03/12/17   Letitia Neri L, PA-C  LORazepam (ATIVAN) 0.5 MG tablet Take 1 tablet (0.5 mg total) by mouth every 8 (eight) hours as needed for anxiety. 12/26/19 12/25/20  Bran Aldridge, Johnette Abraham B, FNP  meloxicam (MOBIC) 15 MG tablet Take 1 tablet (15 mg total) by mouth daily. 12/26/19   Victorino Dike,  FNP    Allergies Penicillins  Family History  Problem Relation Age of Onset  . Diabetes Other     Social History Social History   Tobacco Use  . Smoking status: Current Every Day Smoker    Types: Cigarettes  . Smokeless tobacco: Never Used  Substance Use Topics  . Alcohol use: Yes  . Drug use: No    Review of Systems Constitutional: Negative for fever. Cardiovascular: Negative for chest pain. Respiratory: Negative for shortness of breath. Musculoskeletal: Positive for right shoulder pain. Skin: Negative for open wounds or lesions. Neurological: Negative for decrease in sensation  ____________________________________________   PHYSICAL EXAM:  VITAL SIGNS: ED Triage Vitals  Enc Vitals Group     BP 12/26/19 1259 (!) 151/81     Pulse Rate 12/26/19 1259 68     Resp 12/26/19 1259 18     Temp 12/26/19 1259 (!) 97.4 F (36.3 C)     Temp Source 12/26/19 1259 Oral     SpO2 12/26/19 1259 100 %     Weight 12/26/19 1216 220 lb (99.8 kg)     Height 12/26/19 1216 6\' 4"  (1.93 m)     Head Circumference --      Peak Flow --      Pain Score 12/26/19 1216 8     Pain Loc --      Pain Edu? --      Excl. in [place]? --     Constitutional: Alert and oriented. Well appearing and in  no acute distress. Eyes: Conjunctivae are clear without discharge or drainage Head: Atraumatic Neck: Supple. Respiratory: No cough. Respirations are even and unlabored. Musculoskeletal: Focal tenderness along the [facility] and superior aspect of the right shoulder.  Guarded passive range of motion.  Pain increases with attempts to abduct and externally rotate right shoulder.  No focal tenderness noted over the remainder of the right upper extremity.  Radial pulses 2+. Neurologic: Motor and sensory function is intact Skin: No open wounds or lesions noted on exposed skin Psychiatric: Affect and behavior are appropriate.  ____________________________________________   LABS (all labs ordered are listed, but  only abnormal results are displayed)  Labs Reviewed - No data to display ____________________________________________  RADIOLOGY  No fracture or malalignment in the right shoulder.  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 ____________________________________________   PROCEDURES  Procedures  ____________________________________________   INITIAL IMPRESSION / ASSESSMENT AND PLAN / ED COURSE  Brandon Silva is a 58 y.o. who presents to the emergency department for treatment and evaluation of right shoulder pain status post fall about 2 weeks ago and reinjury today.  He also requests medication refill.  Patient will be prescribed Naprosyn, placed in a shoulder sling, given a prescription for 12 tablets of lorazepam 0.5 mg tablets every 8 hours until he is evaluated by [facility].  According to controlled substance database, patient received his last prescription in September.  Patient instructed to follow-up with [facility] and primary care as soon as possible.  He was also instructed to return to the emergency department for symptoms that change or worsen if unable schedule an appointment  Medications - No data to display  Pertinent labs & imaging results that were available during my care of the patient were reviewed by me and considered in my medical decision making (see chart for details).  _________________________________________   FINAL CLINICAL IMPRESSION(S) / ED DIAGNOSES  Final diagnoses:  Acromioclavicular joint injury, right, initial encounter  Encounter for medication refill    ED Discharge Orders         Ordered    LORazepam (ATIVAN) 0.5 MG tablet  Every 8 hours PRN     12/26/19 1353    meloxicam (MOBIC) 15 MG tablet  Daily     12/26/19 1353           If controlled substance prescribed during this visit, 12 month history viewed on the [facility] prior to issuing an initial prescription for Schedule II or III opiod.   Chinita Pester, FNP 12/26/19 1539     Emily Filbert, MD 12/27/19 [phone]

## 2019-12-26 NOTE — ED Triage Notes (Signed)
Pt reports fell on his right shoulder about 2 weeks ago and then hurt it again and states now pain is worse and he cannot lift his right arm

## 2019-12-26 NOTE — ED Triage Notes (Signed)
Pt also requesting a refill on all of his medications.

## 2020-07-19 ENCOUNTER — Emergency Department
Admission: EM | Admit: 2020-07-19 | Discharge: 2020-07-20 | DRG: 897 | Disposition: A | Discharge status: Home / Self Care | Payer: Medicaid Other | Attending: Emergency Medicine | Admitting: Emergency Medicine

## 2020-07-19 ENCOUNTER — Emergency Department: Payer: Medicaid Other

## 2020-07-19 DIAGNOSIS — Z79899 Other long term (current) drug therapy: Secondary | ICD-10-CM

## 2020-07-19 DIAGNOSIS — Z880 Allergy status to penicillin: Secondary | ICD-10-CM | POA: Diagnosis not present

## 2020-07-19 DIAGNOSIS — G40901 Epilepsy, unspecified, not intractable, with status epilepticus: Secondary | ICD-10-CM | POA: Diagnosis present

## 2020-07-19 DIAGNOSIS — F10139 Alcohol abuse with withdrawal, unspecified: Secondary | ICD-10-CM | POA: Diagnosis not present

## 2020-07-19 DIAGNOSIS — Z833 Family history of diabetes mellitus: Secondary | ICD-10-CM

## 2020-07-19 DIAGNOSIS — Z9114 Patient's other noncompliance with medication regimen: Secondary | ICD-10-CM

## 2020-07-19 DIAGNOSIS — Z794 Long term (current) use of insulin: Secondary | ICD-10-CM | POA: Diagnosis not present

## 2020-07-19 DIAGNOSIS — F17210 Nicotine dependence, cigarettes, uncomplicated: Secondary | ICD-10-CM | POA: Diagnosis present

## 2020-07-19 DIAGNOSIS — F1010 Alcohol abuse, uncomplicated: Secondary | ICD-10-CM | POA: Diagnosis present

## 2020-07-19 DIAGNOSIS — G40909 Epilepsy, unspecified, not intractable, without status epilepticus: Secondary | ICD-10-CM

## 2020-07-19 DIAGNOSIS — Z20822 Contact with and (suspected) exposure to covid-19: Secondary | ICD-10-CM | POA: Diagnosis not present

## 2020-07-19 DIAGNOSIS — F10129 Alcohol abuse with intoxication, unspecified: Secondary | ICD-10-CM | POA: Diagnosis not present

## 2020-07-19 DIAGNOSIS — F10929 Alcohol use, unspecified with intoxication, unspecified: Secondary | ICD-10-CM

## 2020-07-19 DIAGNOSIS — Z791 Long term (current) use of non-steroidal anti-inflammatories (NSAID): Secondary | ICD-10-CM

## 2020-07-19 DIAGNOSIS — E119 Type 2 diabetes mellitus without complications: Secondary | ICD-10-CM | POA: Diagnosis present

## 2020-07-19 DIAGNOSIS — R569 Unspecified convulsions: Secondary | ICD-10-CM | POA: Diagnosis present

## 2020-07-19 DIAGNOSIS — F13239 Sedative, hypnotic or anxiolytic dependence with withdrawal, unspecified: Secondary | ICD-10-CM | POA: Diagnosis not present

## 2020-07-19 LAB — CBC
HCT: 42.9 % (ref 39.0–52.0)
Hemoglobin: 14.8 g/dL (ref 13.0–17.0)
MCH: 30.8 pg (ref 26.0–34.0)
MCHC: 34.5 g/dL (ref 30.0–36.0)
MCV: 89.4 fL (ref 80.0–100.0)
Platelets: 222 10*3/uL (ref 150–400)
RBC: 4.8 MIL/uL (ref 4.22–5.81)
RDW: 11.9 % (ref 11.5–15.5)
WBC: 6.3 10*3/uL (ref 4.0–10.5)
nRBC: 0 % (ref 0.0–0.2)

## 2020-07-19 LAB — ETHANOL: Alcohol, Ethyl (B): 253 mg/dL — ABNORMAL HIGH (ref <10)

## 2020-07-19 LAB — GLUCOSE, CAPILLARY: Glucose-Capillary: 131 mg/dL — ABNORMAL HIGH (ref 70–99)

## 2020-07-19 MED ORDER — LEVETIRACETAM IN NACL 1500 MG/100ML IV SOLN
1500.0000 mg | Freq: Once | INTRAVENOUS | Status: AC
  Administered 2020-07-19: 1500 mg via INTRAVENOUS

## 2020-07-19 MED ORDER — LORAZEPAM 2 MG/ML IJ SOLN
2.0000 mg | Freq: Once | INTRAMUSCULAR | Status: AC
  Administered 2020-07-19: 2 mg via INTRAVENOUS

## 2020-07-19 NOTE — ED Triage Notes (Signed)
Pt arrived to ED via EMS seizing. Call came in to EMS at 22:00. EMS reports 2 seizures reported prior to their arrival and then 6 while with EMS.15 mg of Versed given in route.   Pt activly seizing upon arrival. EMS reports they have been every 5 minutes.  Family reports hx of seizures. No meds seen by EMS at home. Family reported, "he gets this way when he is stressed"   130 cbg hx of DM

## 2020-07-19 NOTE — ED Provider Notes (Signed)
[facility] Emergency Department Provider Note  ____________________________________________   First MD Initiated Contact with Patient 07/19/20 2304     (approximate)  I have reviewed the triage vital signs and the nursing notes.  Level 5 caveat history review of system limited secondary to active seizure activity. HISTORY  Chief Complaint Seizures    HPI Brandon Silva is a 59 y.o. male with history of alcohol abuse diabetes mellitus seizure disorder presents to the emergency department via EMS secondary to seizure-like activity which has been occurring since 9:50 PM tonight. EMS states that the patient's had a witnessed generalized tonic-clonic like seizure every 5 minutes since they have been on scene. Patient was given total of 15 mg of Versed with continued seizure-like activity on arrival to the emergency department.       Past Medical History:  Diagnosis Date  . Alcohol abuse   . Diabetes mellitus without complication (HCC)   . Seizures [facility])     Patient Active Problem List   Diagnosis Date Noted  . Acute respiratory failure (HCC) 09/20/2016  . Seizures (HCC) 09/17/2016  . Ventilator dependence (HCC)   . Hypocalcemia   . AKI (acute kidney injury) (HCC)   . Acute encephalopathy   . ETOH abuse   . Uncontrolled type 2 diabetes mellitus with ketoacidosis without coma, without long-term current use of insulin (HCC)   . Seizure (HCC) 02/04/2016    No past surgical history on file.  Prior to Admission medications   Medication Sig Start Date End Date Taking? Authorizing Provider  acetaminophen (TYLENOL) 325 MG tablet Take 325-650 mg by mouth every 6 (six) hours as needed for headache (or pain).    [provider]  divalproex (DEPAKOTE) 500 MG DR tablet Take 1 tablet (500 mg total) by mouth 3 (three) times daily. 03/12/17   Tommi Rumps, PA-C  insulin NPH-regular Human (NOVOLIN 70/30) (70-30) 100 UNIT/ML injection As directed  03/12/17   Bridget Hartshorn L, PA-C  LORazepam (ATIVAN) 0.5 MG tablet Take 1 tablet (0.5 mg total) by mouth every 8 (eight) hours as needed for anxiety. 12/26/19 12/25/20  Triplett, Rulon Eisenmenger B, FNP  meloxicam (MOBIC) 15 MG tablet Take 1 tablet (15 mg total) by mouth daily. 12/26/19   Chinita Pester, FNP    Allergies Penicillins  Family History  Problem Relation Age of Onset  . Diabetes Other     Social History Social History   Tobacco Use  . Smoking status: Current Every Day Smoker    Types: Cigarettes  . Smokeless tobacco: Never Used  Substance Use Topics  . Alcohol use: Yes  . Drug use: No    Review of Systems  Neurological: Positive for generalized tonic-clonic seizure-like activity.   ____________________________________________   PHYSICAL EXAM:  VITAL SIGNS: ED Triage Vitals [07/19/20 2307]  Enc Vitals Group     BP 122/79     Pulse Rate 95     Resp 16     Temp 98.1 F (36.7 C)     Temp Source Oral     SpO2 93 %     Weight      Height      Head Circumference      Peak Flow      Pain Score      Pain Loc      Pain Edu?      Excl. in GC?     Constitutional: Unresponsive to noxious stimuli. EtOH on breath. Eyes: Conjunctivae are normal.  Head: Atraumatic. Mouth/Throat: Patient is wearing a mask. Neck: No stridor.  No meningeal signs.   Cardiovascular: Normal rate, regular rhythm. Good peripheral circulation. Grossly normal heart sounds. Respiratory: Normal respiratory effort.  No retractions. Gastrointestinal: Soft and nontender. No distention.  Musculoskeletal: No lower extremity tenderness nor edema. No gross deformities of extremities. Neurologic: Generalized tonic-clonic seizure-like activity. Skin:  Skin is warm, dry and intact.  ____________________________________________   LABS (all labs ordered are listed, but only abnormal results are displayed)  Labs Reviewed  GLUCOSE, CAPILLARY - Abnormal; Notable for the following components:       Result Value   Glucose-Capillary 131 (*)    All other components within normal limits  COMPREHENSIVE METABOLIC PANEL - Abnormal; Notable for the following components:   Glucose, Bld 131 (*)    Calcium 8.8 (*)    All other components within normal limits  ETHANOL - Abnormal; Notable for the following components:   Alcohol, Ethyl (B) 253 (*)    All other components within normal limits  SARS CORONAVIRUS 2 BY RT PCR (HOSPITAL ORDER, PERFORMED IN [facility])  CBC  CK  TSH  URINE DRUG SCREEN, QUALITATIVE ([facility] ONLY)   ____________________________________________  EKG  ED ECG REPORT I, Ada N Lilyanah Celestin, the attending physician, personally viewed and interpreted this ECG.   Date: 07/19/2020  EKG Time: 11:01 PM  Rate: 87  Rhythm: Normal sinus rhythm  Axis: Normal  Intervals: Normal  ST&T Change: None  ____________________________________________  RADIOLOGY I, [place] N Merissa Renwick, personally viewed and evaluated these images (plain radiographs) as part of my medical decision making, as well as reviewing the written report by the radiologist.  ED MD interpretation: No acute intracranial abnormality noted on CT head per radiologist.    Official radiology report(s): CT Head Wo Contrast  Result Date: 07/19/2020 CLINICAL DATA:  Seizures EXAM: CT HEAD WITHOUT CONTRAST TECHNIQUE: Contiguous axial images were obtained from the base of the skull through the vertex without intravenous contrast. COMPARISON:  CT head 09/17/2016, MRI 09/20/2016 FINDINGS: Brain: Streak artifact resulting in artifactual hypoattenuation across the anterior temporal poles and lower pons. No evidence of acute infarction, hemorrhage, hydrocephalus, extra-axial collection or mass lesion/mass effect. Patchy areas of white matter hypoattenuation are most compatible with chronic microvascular angiopathy. No significant age advanced volume loss. Midline intracranial structures are normal. Cerebellar tonsils  normally positioned. Vascular: Atherosclerotic calcification of the carotid siphons. No hyperdense vessel. Skull: No calvarial fracture or suspicious osseous lesion. No scalp swelling or hematoma. Sinuses/Orbits: Paranasal sinuses and mastoid air cells are predominantly clear. Mild bilateral exophthalmos similar to comparison is. Orbital contents otherwise unremarkable. Other: None IMPRESSION: 1. No acute intracranial abnormality. 2. Stable mild chronic microvascular angiopathy changes. 3. Mild bilateral exophthalmos similar to comparison. Correlate for history of thyroid eye disease. Electronically Signed   By: Kreg Shropshire M.D.   On: 07/19/2020 23:42     .Critical Care Performed by: Darci Current, MD Authorized by: Darci Current, MD   Critical care provider statement:    Critical care time (minutes):  30   Critical care time was exclusive of:  Separately billable procedures and treating other patients   Critical care was necessary to treat or prevent imminent or life-threatening deterioration of the following conditions:  CNS failure or compromise   Critical care was time spent personally by me on the following activities:  Development of treatment plan with patient or surrogate, discussions with consultants, evaluation of patient's response to treatment, examination of patient,  obtaining history from patient or surrogate, ordering and performing treatments and interventions, ordering and review of laboratory studies, ordering and review of radiographic studies, pulse oximetry, re-evaluation of patient's condition and review of old charts     ____________________________________________   INITIAL IMPRESSION / MDM / ASSESSMENT AND PLAN / ED COURSE  As part of my medical decision making, I reviewed the following data within the electronic MEDICAL RECORD NUMBER  59 year old male presented with above-stated history and physical exam secondary to status epilepticus.  Patient given 2 mg of IV  Ativan and 3 g of IV Keppra on arrival to the emergency department with complete resolution of seizure-like activity.  Laboratory data notable for an alcohol level 253.   _____________  FINAL CLINICAL IMPRESSION(S) / ED DIAGNOSES  Final diagnoses:  Status epilepticus (HCC)  Alcoholic intoxication with complication (HCC)     MEDICATIONS GIVEN DURING THIS VISIT:  Medications  LORazepam (ATIVAN) injection 2 mg (2 mg Intravenous Given 07/19/20 2258)  levETIRAcetam (KEPPRA) IVPB 1500 mg/ 100 mL premix (0 mg Intravenous Stopped 07/19/20 2326)  levETIRAcetam (KEPPRA) IVPB 1500 mg/ 100 mL premix (0 mg Intravenous Stopped 07/19/20 2332)     ED Discharge Orders    None      *Please note:  Labrandon Knoch was evaluated in Emergency Department on 07/20/2020 for the symptoms described in the history of present illness. He was evaluated in the context of the global COVID-19 pandemic, which necessitated consideration that the patient might be at risk for infection with the SARS-CoV-2 virus that causes COVID-19. Institutional protocols and algorithms that pertain to the evaluation of patients at risk for COVID-19 are in a state of rapid change based on information released by regulatory bodies including the CDC and federal and state organizations. These policies and algorithms were followed during the patient's care in the ED.  Some ED evaluations and interventions may be delayed as a result of limited staffing during and after the pandemic.*  Note:  This document was prepared using Dragon voice recognition software and may include unintentional dictation errors.   Darci Current, MD 07/20/20 [id number]

## 2020-07-19 NOTE — ED Notes (Signed)
Pt transported to CT. No seizure like activity.

## 2020-07-20 DIAGNOSIS — G40909 Epilepsy, unspecified, not intractable, without status epilepticus: Secondary | ICD-10-CM | POA: Diagnosis not present

## 2020-07-20 LAB — COMPREHENSIVE METABOLIC PANEL
ALT: 20 U/L (ref 0–44)
AST: 26 U/L (ref 15–41)
Albumin: 4.2 g/dL (ref 3.5–5.0)
Alkaline Phosphatase: 82 U/L (ref 38–126)
Anion gap: 12 (ref 5–15)
BUN: 10 mg/dL (ref 6–20)
CO2: 23 mmol/L (ref 22–32)
Calcium: 8.8 mg/dL — ABNORMAL LOW (ref 8.9–10.3)
Chloride: 104 mmol/L (ref 98–111)
Creatinine, Ser: 1.18 mg/dL (ref 0.61–1.24)
GFR calc Af Amer: >60 mL/min (ref >60)
GFR calc non Af Amer: >60 mL/min (ref >60)
Glucose, Bld: 131 mg/dL — ABNORMAL HIGH (ref 70–99)
Potassium: 3.9 mmol/L (ref 3.5–5.1)
Sodium: 139 mmol/L (ref 135–145)
Total Bilirubin: 0.7 mg/dL (ref 0.3–1.2)
Total Protein: 7.5 g/dL (ref 6.5–8.1)

## 2020-07-20 LAB — SARS CORONAVIRUS 2 BY RT PCR (HOSPITAL ORDER, PERFORMED IN ~~LOC~~ HOSPITAL LAB): SARS Coronavirus 2: NEGATIVE

## 2020-07-20 LAB — TSH: TSH: 1.206 u[IU]/mL (ref 0.350–4.500)

## 2020-07-20 LAB — VALPROIC ACID LEVEL: Valproic Acid Lvl: <10 ug/mL — ABNORMAL LOW (ref 50.0–100.0)

## 2020-07-20 LAB — CK: Total CK: 343 U/L (ref 49–397)

## 2020-07-20 LAB — MAGNESIUM: Magnesium: 2.2 mg/dL (ref 1.7–2.4)

## 2020-07-20 LAB — PHOSPHORUS: Phosphorus: 4.2 mg/dL (ref 2.5–4.6)

## 2020-07-20 LAB — HIV ANTIBODY (ROUTINE TESTING W REFLEX): HIV Screen 4th Generation wRfx: NONREACTIVE

## 2020-07-20 MED ORDER — DIVALPROEX SODIUM 500 MG PO DR TAB
500.0000 mg | DELAYED_RELEASE_TABLET | Freq: Three times a day (TID) | ORAL | 0 refills | Status: DC
Start: 2020-07-20 — End: 2020-08-17

## 2020-07-20 MED ORDER — LORAZEPAM 2 MG/ML IJ SOLN: 1.0000 mg | INTRAMUSCULAR | Status: DC | PRN

## 2020-07-20 MED ORDER — MELOXICAM 15 MG PO TABS: 15.0000 mg | ORAL_TABLET | Freq: Every day | ORAL | 0 refills | Status: DC

## 2020-07-20 MED ORDER — LORAZEPAM 0.5 MG PO TABS
0.5000 mg | ORAL_TABLET | Freq: Three times a day (TID) | ORAL | 0 refills | Status: DC | PRN
Start: 2020-07-20 — End: 2021-01-02

## 2020-07-20 MED ORDER — NOVOLIN 70/30 (70-30) 100 UNIT/ML ~~LOC~~ SUSP
SUBCUTANEOUS | 0 refills | Status: DC
Start: 2020-07-20 — End: 2021-01-02

## 2020-07-20 MED ORDER — ONDANSETRON HCL 4 MG PO TABS: 4.0000 mg | ORAL_TABLET | Freq: Four times a day (QID) | ORAL | Status: DC | PRN

## 2020-07-20 MED ORDER — LORAZEPAM 1 MG PO TABS: 1.0000 mg | ORAL_TABLET | ORAL | Status: DC | PRN

## 2020-07-20 MED ORDER — ACETAMINOPHEN 650 MG RE SUPP: 650.0000 mg | RECTAL | Status: DC | PRN

## 2020-07-20 MED ORDER — ADULT MULTIVITAMIN W/MINERALS CH
1.0000 | ORAL_TABLET | Freq: Every day | ORAL | Status: DC
  Administered 2020-07-20: 1 via ORAL
  Filled 2020-07-20: qty 1

## 2020-07-20 MED ORDER — ONDANSETRON HCL 4 MG/2ML IJ SOLN: 4.0000 mg | Freq: Four times a day (QID) | INTRAMUSCULAR | Status: DC | PRN

## 2020-07-20 MED ORDER — THIAMINE HCL 100 MG PO TABS
100.0000 mg | ORAL_TABLET | Freq: Every day | ORAL | Status: DC
  Administered 2020-07-20: 100 mg via ORAL
  Filled 2020-07-20: qty 1

## 2020-07-20 MED ORDER — SODIUM CHLORIDE 0.9 % IV SOLN
75.0000 mL/h | INTRAVENOUS | Status: DC
  Administered 2020-07-20: 75 mL/h via INTRAVENOUS

## 2020-07-20 MED ORDER — THIAMINE HCL 100 MG/ML IJ SOLN: 100.0000 mg | Freq: Every day | INTRAMUSCULAR | Status: DC

## 2020-07-20 MED ORDER — DIVALPROEX SODIUM 500 MG PO DR TAB
500.0000 mg | DELAYED_RELEASE_TABLET | Freq: Three times a day (TID) | ORAL | Status: DC
  Administered 2020-07-20: 500 mg via ORAL
  Filled 2020-07-20: qty 1

## 2020-07-20 MED ORDER — LEVETIRACETAM IN NACL 500 MG/100ML IV SOLN
500.0000 mg | Freq: Two times a day (BID) | INTRAVENOUS | Status: DC
  Filled 2020-07-20 (×2): qty 100

## 2020-07-20 MED ORDER — ACETAMINOPHEN 325 MG PO TABS: 650.0000 mg | ORAL_TABLET | ORAL | Status: DC | PRN

## 2020-07-20 MED ORDER — FOLIC ACID 1 MG PO TABS
1.0000 mg | ORAL_TABLET | Freq: Every day | ORAL | Status: DC
  Administered 2020-07-20: 1 mg via ORAL
  Filled 2020-07-20: qty 1

## 2020-07-20 MED ORDER — ENOXAPARIN SODIUM 40 MG/0.4ML ~~LOC~~ SOLN: 40.0000 mg | SUBCUTANEOUS | Status: DC

## 2020-07-20 NOTE — ED Notes (Signed)
Pt sleeping in bed. NAD at this time. Audible respirations and visible chest rise and fall.

## 2020-07-20 NOTE — ED Notes (Signed)
Pt given phone to speak to his mother to find Gina to find a ride home. Pt repeatedly stating that he is going home. This RN and neurologist explained to patient that he needed a safe ride to go home. This RN spoke with admitting MD Shah regarding concerns of patient leaving AMA and being unsafe discharge due to ETOH level, disorientation and medications administered PTA and while in ED. Per Dr. Shah pt able to sign out AMA, unable to keep patient here, unable to IVC patient due to no criteria met. Pt on phone trying to find a ride home at this time. Pt remains alert and somewhat disoriented at this time. Pt follows commands but needs frequent redirection. Fall alarm in place at this time.  

## 2020-07-20 NOTE — ED Provider Notes (Signed)
Patient is awake and alert and eating breakfast.  He has been cleared for discharge by neurology.  I will refill his medications.   Emily Filbert, MD 07/20/20 [phone]

## 2020-07-20 NOTE — Consult Note (Signed)
Requesting Physician: Dr. Delfino Lovett   Chief Complaint: Seizures  History obtained from: Patient and Chart   HPI:                                                                                                                                       Brandon Silva is a 59 y.o. male with PMH of alcohol abuse, DM, seizure on Depakote and Lorazepam at home presents with multiple generalized tonic clonic seizures.   Had multiple seizures every 5 min per EMS and 15 mg Versed given prior to arrival. Continued to have seizures, however following load with Keppra, has not had further seizures since admission.   Patient admit to not being compliant with meds. Spoke to mother who also admitted that he is not taking meds. Recently fought with girlfriend.     Past Medical History:  Diagnosis Date  . Alcohol abuse   . Diabetes mellitus without complication (HCC)   . Seizures (HCC)     No past surgical history on file.  Family History  Problem Relation Age of Onset  . Diabetes Other    Social History:  reports that he has been smoking cigarettes. He has never used smokeless tobacco. He reports current alcohol use. He reports that he does not use drugs.  Allergies:  Allergies  Allergen Reactions  . Penicillins Rash    Has patient had a PCN reaction causing immediate rash, facial/tongue/throat swelling, SOB or lightheadedness with hypotension: Yes Has patient had a PCN reaction causing severe rash involving mucus membranes or skin necrosis: No Has patient had a PCN reaction that required hospitalization: No Has patient had a PCN reaction occurring within the last 10 years: No If all of the above answers are "NO", then may proceed with Cephalosporin use.     Medications:                                                                                                                        I reviewed home medications   ROS:  14 systems reviewed and negative except above    Examination:                                                                                                      General: Appears well-developed and well-nourished.  Psych: Affect appropriate to situation Eyes: No scleral injection HENT: No OP obstrucion Head: Normocephalic.  Cardiovascular: Normal rate and regular rhythm.  Respiratory: Effort normal and breath sounds normal to anterior ascultation GI: Soft.  No distension. There is no tenderness.  Skin: WDI    Neurological Examination Mental Status: Alert, oriented to himself, not month. Knows reason for being present in the ED. Cranial Nerves: II: Visual fields grossly normal,  III,IV, VI: ptosis not present, extra-ocular motions intact bilaterally, pupils equal, round, reactive to light and accommodation V,VII: smile symmetric, facial light touch sensation normal bilaterally XII: midline tongue extension Motor: Right : Upper extremity   5/5    Left:     Upper extremity   5/5  Lower extremity   5/5     Lower extremity   5/5 Tone and bulk:normal tone throughout; no atrophy noted Sensory: Pinprick and light touch intact throughout, bilaterally Plantars: Right: downgoing   Left: downgoing Cerebellar: normal finger-to-nose Gait: NOT ASSESSED due to safety     Lab Results: Basic Metabolic Panel: Recent Labs  Lab 07/19/20 2304 07/20/20 0734  NA 139  --   K 3.9  --   CL 104  --   CO2 23  --   GLUCOSE 131*  --   BUN 10  --   CREATININE 1.18  --   CALCIUM 8.8*  --   MG  --  2.2  PHOS  --  4.2    CBC: Recent Labs  Lab 07/19/20 2304  WBC 6.3  HGB 14.8  HCT 42.9  MCV 89.4  PLT 222    Coagulation Studies: No results for input(s): LABPROT, INR in the last 72 hours.  Imaging: CT Head Wo Contrast  Result Date: 07/19/2020 CLINICAL DATA:  Seizures EXAM: CT HEAD WITHOUT CONTRAST TECHNIQUE: Contiguous  axial images were obtained from the base of the skull through the vertex without intravenous contrast. COMPARISON:  CT head 09/17/2016, MRI 09/20/2016 FINDINGS: Brain: Streak artifact resulting in artifactual hypoattenuation across the anterior temporal poles and lower pons. No evidence of acute infarction, hemorrhage, hydrocephalus, extra-axial collection or mass lesion/mass effect. Patchy areas of white matter hypoattenuation are most compatible with chronic microvascular angiopathy. No significant age advanced volume loss. Midline intracranial structures are normal. Cerebellar tonsils normally positioned. Vascular: Atherosclerotic calcification of the carotid siphons. No hyperdense vessel. Skull: No calvarial fracture or suspicious osseous lesion. No scalp swelling or hematoma. Sinuses/Orbits: Paranasal sinuses and mastoid air cells are predominantly clear. Mild bilateral exophthalmos similar to comparison is. Orbital contents otherwise unremarkable. Other: None IMPRESSION: 1. No acute intracranial abnormality. 2. Stable mild chronic microvascular angiopathy changes. 3. Mild bilateral exophthalmos similar to comparison. Correlate for history of thyroid eye disease. Electronically Signed   By: Kreg Shropshire M.D.   On: 07/19/2020 23:42  I have reviewed the above imaging : CT head   ASSESSMENT AND PLAN  59 y.o. male with PMH of alcohol abuse, DM, seizure on Depakote and Lorazepam at home presents with multiple generalized tonic clonic seizures. Remains slightly post-ictal, recommend EDP to watch patient for few more hours and walk him before discharge. Also needs to have home medications continued    Patient agitated this morning, threatening to leave AMA. No focal weakness. Recommended staying for few more hours and to be assessed for safe discharge and patient agreed. However, later insisted to leave.   Breakthrough seizure in the setting of alcohol abuse/withdrawal and non compliance of medications   Possible benzo withdrawal Alcohol abuse  Recommendations - No further inpatient neurological work up  - Continue Home Depakote and Lorazepam, please refill medications. Depakote less than 10 - Check if patient can walk safely before discharge - NO DRIVING FOR 6 MONTHS - Per [facility] statutes, patients with seizures are not allowed to drive until they have been seizure-free for six months. Use caution when using heavy equipment or power tools. Avoid working on ladders or at heights. Take showers instead of baths. Ensure the water temperature is not too high on the home water heater. Do not go swimming alone. Do not lock yourself in a room alone (i.e. bathroom). When caring for infants or small children, sit down when holding, feeding, or changing them to minimize risk of injury to the child in the event you have a seizure. Maintain good sleep hygiene. Avoid alcohol.    If Brandon Silva has another seizure, call 911 and bring them back to the ED if:       A.  The seizure lasts longer than 5 minutes.            B.  The patient doesn't wake shortly after the seizure or has new problems such as difficulty seeing, speaking or moving following the seizure       C.  The patient was injured during the seizure       D.  The patient has a temperature over 102 F (39C)       E.  The patient vomited during the seizure and now is having trouble breathing   Outpatient neurology follow up in 4 weeks  Brandon Silva [facility] Pager Number [phone]

## 2020-07-20 NOTE — ED Notes (Signed)
Pt found getting out of bed after staff heard his calling for help. Pt assisted back into bed by staff and new warm blankets applied. Pt placed back on monitor and in NAD at this time. Pt remains very drowsy at this time but is talking to staff. Pt requesting to call "my people" but is unable to operate the phone independently. Rn asked for the umber and after dialing three numbers that were disconnected pt gave RN [phone] that went to a voicemail that did not identify themselves. Pt updated it is the middle of the night and people are probably asleep but pt continued to insist RN continue calling. When RN attempted to convince patient again to wait until morning to call people pt states, "I need a new doctor, I don't feel safe here." Pt unable to clarify a specific reason he feels unsafe and then stated calling out names of family and yelling at nurse to "call them" Pt reassured to the best of this RNs ability that he was safe but pt continued to mumble that he needed "his people" Lights dimmed but RN remains at bedside to ensure pt safety at this time.

## 2020-07-20 NOTE — ED Notes (Signed)
Pt sitting up in bed at this time eating breakfast. Pt requesting his Lorazepam at this time, pt repeatedly stating "I need my lorazepam, I need my lorazepam". Pt also states, "I need my shirt, I'm leaving, I'm about to take all this shit off and leave". This RN explained pt's mother en route and requested that patient wait for his mother to arrive prior to leaving. This RN explained would bring patient clothes as requested when his mother arrived. Pt states "alright you gone see me". This RN explained needed to go see other patients this RN was responsible for and would return to remove monitor equipment and IV's. Pt states "alright", medications administered per MD order. Pt tolerated well. Pt continues to sit up in bed and eat breakfast at this time.

## 2020-07-20 NOTE — ED Notes (Signed)
This RN to bedside, pt refusing to keep IV's or monitoring equipment on until his mother/safe ride arrives. This RN spoke with EDP and per EDP pt okay to await ride in lobby. IV's removed, D/C paperwork reviewed with patient by this RN. Pt provided with paper scrub top. Pt assisted into wheelchair by this, pt initially refusing wheelchair, pt with noted unsteady gait at time of D/C, this RN insisted patient be taken to lobby via wheelchair. Pt attempting to hug and give this RN a kiss on D/C this RN explained professional boundaries to patient. Pt assisted into wheelchair and taken to lobby by this RN. Pt contracted for safety by this RN to wait in lobby for his mother to arrive. Pt placed by courtesy phone in lobby near screener. This RN informed screener and first RN of situation regarding patient and safety concerns this RN voiced to admitting MD Sherryll Burger and that per Neurologist and that per both MD's pt stable and competent to be discharged. Verbal consent for discharged obtained by this RN.

## 2020-07-20 NOTE — ED Notes (Signed)
Pt sleeping and in NAD at this time.

## 2020-07-20 NOTE — ED Notes (Signed)
RN able to contact patient's mother who confirmed Brandon Silva is sleeping and would come to hospital in the morning. Mother in agreement that pt needs admission to the hospital and confirmed pt has not been taking his seizure medications. Pt updated but continues to call out for Brandon Silva repeatedly and ask RN when he can go home.

## 2020-07-20 NOTE — ED Notes (Signed)
This RN to bedside, introduced self to patient. IV initiated to facilitate fluid administration per order. Pt tolerated well. Pt provided phone, attempted to call fiance repeatedly, however unable to reach fiance, called mother instead. Pt alert but disoriented at this time. Seizure pads and O2 in place, however patient removed O2. Lights dimmed for patient comfort. Pt asking when he can go home, explained per previous shift RN, fiance should arrive around 9 am, explained to patient. Fall alarm in place at this time. Pt provided with pillow at this time.

## 2020-07-20 NOTE — ED Notes (Signed)
Pt sleeping with visible chest rise and fall, audible snoring and no seizure activity noted since seizure upon arrival to ED.

## 2020-07-20 NOTE — ED Notes (Signed)
This RN called and spoke with patient's mother Guinevere Ferrari [phone] with patient's permission. Pt's mother states she is able to come and pick him up. Pt sitting up in bed eating breakfast at this time.

## 2020-07-20 NOTE — ED Notes (Signed)
Neurology at bedside at this time to assess patient.  

## 2020-07-20 NOTE — ED Notes (Signed)
This RN to bedside due to patient repeatedly calling out requesting to use the phone despite this RN explaining to patient phone being in use. This RN attempted to call Almira Coaster with patient's permission, HIPAA compliant message left on phone requesting call back at [phone] per the phone number patient gave this RN. This RN explained to patient the need to relax. Pt requesting to know when he can leave. This RN explained pt would at least need a ride a home or a family member present due to his confusion and being unable to stay awake. Pt repositioned self in bed and went back to sleep. Seizure pads remain in place, posey alarm remains in place at this time.

## 2020-07-20 NOTE — ED Notes (Signed)
MD at bedside. 

## 2020-08-17 ENCOUNTER — Emergency Department
Admission: EM | Admit: 2020-08-17 | Discharge: 2020-08-17 | Disposition: A | Discharge status: Home / Self Care | Payer: Medicaid Other | Attending: Emergency Medicine | Admitting: Emergency Medicine

## 2020-08-17 ENCOUNTER — Emergency Department: Payer: Medicaid Other

## 2020-08-17 ENCOUNTER — Other Ambulatory Visit: Payer: Self-pay

## 2020-08-17 DIAGNOSIS — Z794 Long term (current) use of insulin: Secondary | ICD-10-CM | POA: Insufficient documentation

## 2020-08-17 DIAGNOSIS — E1110 Type 2 diabetes mellitus with ketoacidosis without coma: Secondary | ICD-10-CM | POA: Diagnosis not present

## 2020-08-17 DIAGNOSIS — F17210 Nicotine dependence, cigarettes, uncomplicated: Secondary | ICD-10-CM | POA: Insufficient documentation

## 2020-08-17 DIAGNOSIS — G40909 Epilepsy, unspecified, not intractable, without status epilepticus: Secondary | ICD-10-CM | POA: Diagnosis not present

## 2020-08-17 LAB — COMPREHENSIVE METABOLIC PANEL
ALT: 24 U/L (ref 0–44)
AST: 37 U/L (ref 15–41)
Albumin: 4.3 g/dL (ref 3.5–5.0)
Alkaline Phosphatase: 103 U/L (ref 38–126)
Anion gap: 14 (ref 5–15)
BUN: 14 mg/dL (ref 6–20)
CO2: 20 mmol/L — ABNORMAL LOW (ref 22–32)
Calcium: 8.7 mg/dL — ABNORMAL LOW (ref 8.9–10.3)
Chloride: 101 mmol/L (ref 98–111)
Creatinine, Ser: 0.94 mg/dL (ref 0.61–1.24)
GFR calc Af Amer: >60 mL/min (ref >60)
GFR calc non Af Amer: >60 mL/min (ref >60)
Glucose, Bld: 129 mg/dL — ABNORMAL HIGH (ref 70–99)
Potassium: 3.8 mmol/L (ref 3.5–5.1)
Sodium: 135 mmol/L (ref 135–145)
Total Bilirubin: 0.5 mg/dL (ref 0.3–1.2)
Total Protein: 7.7 g/dL (ref 6.5–8.1)

## 2020-08-17 LAB — CBC WITH DIFFERENTIAL/PLATELET
Abs Immature Granulocytes: 0.02 10*3/uL (ref 0.00–0.07)
Basophils Absolute: 0.1 10*3/uL (ref 0.0–0.1)
Basophils Relative: 1 %
Eosinophils Absolute: 0.2 10*3/uL (ref 0.0–0.5)
Eosinophils Relative: 4 %
HCT: 41.1 % (ref 39.0–52.0)
Hemoglobin: 14.3 g/dL (ref 13.0–17.0)
Immature Granulocytes: 0 %
Lymphocytes Relative: 41 %
Lymphs Abs: 2.5 10*3/uL (ref 0.7–4.0)
MCH: 31 pg (ref 26.0–34.0)
MCHC: 34.8 g/dL (ref 30.0–36.0)
MCV: 89.2 fL (ref 80.0–100.0)
Monocytes Absolute: 0.4 10*3/uL (ref 0.1–1.0)
Monocytes Relative: 6 %
Neutro Abs: 2.8 10*3/uL (ref 1.7–7.7)
Neutrophils Relative %: 48 %
Platelets: 215 10*3/uL (ref 150–400)
RBC: 4.61 MIL/uL (ref 4.22–5.81)
RDW: 12.2 % (ref 11.5–15.5)
WBC: 6 10*3/uL (ref 4.0–10.5)
nRBC: 0 % (ref 0.0–0.2)

## 2020-08-17 LAB — ETHANOL: Alcohol, Ethyl (B): 220 mg/dL — ABNORMAL HIGH (ref <10)

## 2020-08-17 LAB — URINE DRUG SCREEN, QUALITATIVE (ARMC ONLY)
Amphetamines, Ur Screen: NOT DETECTED
Barbiturates, Ur Screen: NOT DETECTED
Benzodiazepine, Ur Scrn: NOT DETECTED
Cannabinoid 50 Ng, Ur ~~LOC~~: NOT DETECTED
Cocaine Metabolite,Ur ~~LOC~~: NOT DETECTED
MDMA (Ecstasy)Ur Screen: NOT DETECTED
Methadone Scn, Ur: NOT DETECTED
Opiate, Ur Screen: NOT DETECTED
Phencyclidine (PCP) Ur S: NOT DETECTED
Tricyclic, Ur Screen: NOT DETECTED

## 2020-08-17 LAB — ACETAMINOPHEN LEVEL: Acetaminophen (Tylenol), Serum: <10 ug/mL — ABNORMAL LOW (ref 10–30)

## 2020-08-17 LAB — VALPROIC ACID LEVEL: Valproic Acid Lvl: <10 ug/mL — ABNORMAL LOW (ref 50.0–100.0)

## 2020-08-17 LAB — SALICYLATE LEVEL: Salicylate Lvl: <7 mg/dL — ABNORMAL LOW (ref 7.0–30.0)

## 2020-08-17 MED ORDER — DIVALPROEX SODIUM 500 MG PO DR TAB
500.0000 mg | DELAYED_RELEASE_TABLET | Freq: Once | ORAL | Status: AC
  Administered 2020-08-17: 500 mg via ORAL
  Filled 2020-08-17: qty 1

## 2020-08-17 MED ORDER — DIVALPROEX SODIUM 500 MG PO DR TAB
500.0000 mg | DELAYED_RELEASE_TABLET | Freq: Three times a day (TID) | ORAL | 0 refills | Status: DC
Start: 2020-08-17 — End: 2021-01-02

## 2020-08-17 NOTE — ED Provider Notes (Addendum)
[facility] Emergency Department Provider Note  ____________________________________________  Time seen: Approximately 2:15 PM  I have reviewed the triage vital signs and the nursing notes.   HISTORY  Chief Complaint Seizure   Level 5 Caveat: Portions of the History and Physical including HPI and review of systems are unable to be completely obtained due to patient being a poor historian   HPI Crixus Mcaulay is a 59 y.o. male with history of diabetes, seizure disorder, alcohol abuse who is brought to the ED today due to suspected seizure at home.  EMS reports the patient was having intermittent jerking movements, worse when he was touched.  They gave a total of 4 mg of Versed prior to arrival in the ED as well as 5 mg of Haldol.   Patient reports that he feels fine, denies any acute symptoms such as chest pain shortness of breath fever body aches abdominal pain vomiting or diarrhea.     Past Medical History:  Diagnosis Date  . Alcohol abuse   . Diabetes mellitus without complication (HCC)   . Seizures [facility])      Patient Active Problem List   Diagnosis Date Noted  . Recurrent seizures (HCC) 07/20/2020  . Acute respiratory failure (HCC) 09/20/2016  . Seizures (HCC) 09/17/2016  . Ventilator dependence (HCC)   . Hypocalcemia   . AKI (acute kidney injury) (HCC)   . Acute encephalopathy   . ETOH abuse   . Uncontrolled type 2 diabetes mellitus with ketoacidosis without coma, without long-term current use of insulin (HCC)   . Seizure (HCC) 02/04/2016     No past surgical history on file.   Prior to Admission medications   Medication Sig Start Date End Date Taking? Authorizing Provider  acetaminophen (TYLENOL) 325 MG tablet Take 325-650 mg by mouth every 6 (six) hours as needed for headache (or pain).    [provider]  divalproex (DEPAKOTE) 500 MG DR tablet Take 1 tablet (500 mg total) by mouth 3 (three) times daily. 07/20/20   Emily Filbert, MD  insulin NPH-regular Human (NOVOLIN 70/30) (70-30) 100 UNIT/ML injection As directed 07/20/20   Emily Filbert, MD  LORazepam (ATIVAN) 0.5 MG tablet Take 1 tablet (0.5 mg total) by mouth every 8 (eight) hours as needed for anxiety. 07/20/20 07/20/21  Emily Filbert, MD  meloxicam (MOBIC) 15 MG tablet Take 1 tablet (15 mg total) by mouth daily. 07/20/20   Emily Filbert, MD     Allergies Penicillins   Family History  Problem Relation Age of Onset  . Diabetes Other     Social History Social History   Tobacco Use  . Smoking status: Current Every Day Smoker    Types: Cigarettes  . Smokeless tobacco: Never Used  Substance Use Topics  . Alcohol use: Yes  . Drug use: No    Review of Systems Level 5 Caveat: Portions of the History and Physical including HPI and review of systems are unable to be completely obtained due to patient being a poor historian   Constitutional:   No known fever.  ENT:   No rhinorrhea. Cardiovascular:   No chest pain or syncope. Respiratory:   No dyspnea or cough. Gastrointestinal:   Negative for abdominal pain, vomiting and diarrhea.  Musculoskeletal:   Negative for focal pain or swelling ____________________________________________   PHYSICAL EXAM:  VITAL SIGNS: ED Triage Vitals  Enc Vitals Group     BP      Pulse  Resp      Temp      Temp src      SpO2      Weight      Height      Head Circumference      Peak Flow      Pain Score      Pain Loc      Pain Edu?      Excl. in GC?     Vital signs reviewed, nursing assessments reviewed.   Constitutional:   Alert and oriented. Non-toxic appearance. Eyes:   Conjunctivae are normal. EOMI. PERRL. ENT      Head:   Normocephalic and atraumatic.      Nose:   No congestion/rhinnorhea.       Mouth/Throat:   MMM, no pharyngeal erythema. No peritonsillar mass.       Neck:   No meningismus. Full ROM. Hematological/Lymphatic/Immunilogical:   No cervical  lymphadenopathy. Cardiovascular:   RRR. Symmetric bilateral radial and DP pulses.  No murmurs. Cap refill less than 2 seconds. Respiratory:   Normal respiratory effort without tachypnea/retractions. Breath sounds are clear and equal bilaterally. No wheezes/rales/rhonchi. Gastrointestinal:   Soft and nontender. Non distended. There is no CVA tenderness.  No rebound, rigidity, or guarding.  Musculoskeletal:   Normal range of motion in all extremities. No joint effusions.  No lower extremity tenderness.  No edema. Neurologic:   Normal speech and language.  Motor grossly intact. No acute focal neurologic deficits are appreciated.  Skin:    Skin is warm, dry and intact. No rash noted.  No petechiae, purpura, or bullae.  ____________________________________________    LABS (pertinent positives/negatives) (all labs ordered are listed, but only abnormal results are displayed) Labs Reviewed  ACETAMINOPHEN LEVEL - Abnormal; Notable for the following components:      Result Value   Acetaminophen (Tylenol), Serum <10 (*)    All other components within normal limits  COMPREHENSIVE METABOLIC PANEL - Abnormal; Notable for the following components:   CO2 20 (*)    Glucose, Bld 129 (*)    Calcium 8.7 (*)    All other components within normal limits  SALICYLATE LEVEL - Abnormal; Notable for the following components:   Salicylate Lvl <7.0 (*)    All other components within normal limits  CBC WITH DIFFERENTIAL/PLATELET  URINE DRUG SCREEN, QUALITATIVE ([facility] ONLY)  ETHANOL  VALPROIC ACID LEVEL   ____________________________________________   EKG  Interpreted by me Sinus rhythm rate of 81, normal axis and intervals.  Normal QRS ST segments and T waves.  ____________________________________________    RADIOLOGY  DG Chest Portable 1 View  Result Date: 08/17/2020 CLINICAL DATA:  Cough.  Possible seizure. EXAM: PORTABLE CHEST 1 VIEW COMPARISON:  Single-view of the chest 09/18/2016. FINDINGS:  Lungs clear. Heart size normal. No pneumothorax or pleural fluid. No bony abnormality. IMPRESSION: Negative chest. Electronically Signed   By: Drusilla Kanner M.D.   On: 08/17/2020 14:40    ____________________________________________   PROCEDURES Procedures  ____________________________________________    CLINICAL IMPRESSION / ASSESSMENT AND PLAN / ED COURSE  Medications ordered in the ED: Medications - No data to display  Pertinent labs & imaging results that were available during my care of the patient were reviewed by me and considered in my medical decision making (see chart for details).   Blade Scheff was evaluated in Emergency Department on 08/17/2020 for the symptoms described in the history of present illness. He was evaluated in the context of the global COVID-19 pandemic,  which necessitated consideration that the patient might be at risk for infection with the SARS-CoV-2 virus that causes COVID-19. Institutional protocols and algorithms that pertain to the evaluation of patients at risk for COVID-19 are in a state of rapid change based on information released by regulatory bodies including the CDC and federal and state organizations. These policies and algorithms were followed during the patient's care in the ED.   Patient presents with a suspected seizure at home.  He reports compliance with his medications.  Will check labs, give food.  On exam he is coughing which I think is just due to saliva aspiration, but will obtain chest x-ray.  No SI HI or hallucinations.  Vitals are normal, not septic.   ----------------------------------------- 2:59 PM on 08/17/2020 -----------------------------------------  Initial labs are all unremarkable, vital signs are normal.  Will follow up ethanol level and Depakote level, observe until clinically sober and able to be discharged on his own.  Will give extra Depakote if level is low.       ____________________________________________   FINAL CLINICAL IMPRESSION(S) / ED DIAGNOSES    Final diagnoses:  Seizure disorder [facility])     ED Discharge Orders    None      Portions of this note were generated with dragon dictation software. Dictation errors may occur despite best attempts at proofreading.   Sharman Cheek, MD 08/17/20 1417    Sharman Cheek, MD 08/17/20 1459

## 2020-08-17 NOTE — ED Provider Notes (Signed)
Patient is now awake, alert. Suspect moderate postictal state with h/o seizures vs etoh intoxication. He is alert now, ambulatory, denies any complaints. Requesting to leave. He is able to tell me where he is, how this happened, and need for adherence with his seizure meds. Encouraged trying to regulate EtOh intake, d/c home with return precautions.  Depakote level pending but pt admits to nonadherence. Dose given here and I've refilled his meds at pharmacy. Discussed with his mother as well who is picking pt up.   Shaune Pollack, MD 08/17/20 [phone]

## 2020-08-17 NOTE — Discharge Instructions (Addendum)
Follow up with a primary doctor in the next 1-2 weeks. If you do not have one, call [facility] or the [facility] for follow-up.  I have refilled your depakote and sent it to [facility].

## 2020-08-17 NOTE — ED Triage Notes (Signed)
Pt arrives via ems from home, family called 911 due to pt having a seizure, pt became combative with ems, ems states that he never appeared to have a post ictal period, pt was administered 2mg  versed intra nasally and 1mg  IM and 1 mg iv. Pt was also given 5 mg of haldol, pt appears to be sleepy at this time, pt reports that he takes medications for seizures called depakote and his insulin as well as his meds for HIV

## 2020-08-17 NOTE — ED Notes (Signed)
Dr Erma Heritage aware of pt's desire to leave, pt ambulatory with a steady gait to the nursing station, officer isley also speaking with pt and reassuring him that we were working on getting him taken care of

## 2020-09-17 ENCOUNTER — Ambulatory Visit: Payer: Medicaid Other

## 2021-01-01 ENCOUNTER — Emergency Department: Payer: Medicaid Other

## 2021-01-01 ENCOUNTER — Inpatient Hospital Stay
Admission: EM | Admit: 2021-01-01 | Discharge: 2021-01-02 | DRG: 917 | Disposition: A | Discharge status: Home / Self Care | Payer: Medicaid Other | Attending: Internal Medicine | Admitting: Internal Medicine

## 2021-01-01 DIAGNOSIS — R410 Disorientation, unspecified: Secondary | ICD-10-CM

## 2021-01-01 DIAGNOSIS — R4182 Altered mental status, unspecified: Secondary | ICD-10-CM | POA: Diagnosis not present

## 2021-01-01 DIAGNOSIS — E872 Acidosis: Secondary | ICD-10-CM | POA: Diagnosis present

## 2021-01-01 DIAGNOSIS — Z79899 Other long term (current) drug therapy: Secondary | ICD-10-CM | POA: Diagnosis not present

## 2021-01-01 DIAGNOSIS — Z880 Allergy status to penicillin: Secondary | ICD-10-CM | POA: Diagnosis not present

## 2021-01-01 DIAGNOSIS — J9600 Acute respiratory failure, unspecified whether with hypoxia or hypercapnia: Secondary | ICD-10-CM

## 2021-01-01 DIAGNOSIS — Z794 Long term (current) use of insulin: Secondary | ICD-10-CM

## 2021-01-01 DIAGNOSIS — R7989 Other specified abnormal findings of blood chemistry: Secondary | ICD-10-CM | POA: Diagnosis present

## 2021-01-01 DIAGNOSIS — Z833 Family history of diabetes mellitus: Secondary | ICD-10-CM | POA: Diagnosis not present

## 2021-01-01 DIAGNOSIS — F10129 Alcohol abuse with intoxication, unspecified: Secondary | ICD-10-CM | POA: Diagnosis present

## 2021-01-01 DIAGNOSIS — Y906 Blood alcohol level of 120-199 mg/100 ml: Secondary | ICD-10-CM | POA: Diagnosis present

## 2021-01-01 DIAGNOSIS — Z20822 Contact with and (suspected) exposure to covid-19: Secondary | ICD-10-CM | POA: Diagnosis present

## 2021-01-01 DIAGNOSIS — F1410 Cocaine abuse, uncomplicated: Secondary | ICD-10-CM | POA: Diagnosis present

## 2021-01-01 DIAGNOSIS — E876 Hypokalemia: Secondary | ICD-10-CM | POA: Diagnosis present

## 2021-01-01 DIAGNOSIS — N179 Acute kidney failure, unspecified: Secondary | ICD-10-CM | POA: Diagnosis present

## 2021-01-01 DIAGNOSIS — F17210 Nicotine dependence, cigarettes, uncomplicated: Secondary | ICD-10-CM | POA: Diagnosis present

## 2021-01-01 DIAGNOSIS — F1010 Alcohol abuse, uncomplicated: Secondary | ICD-10-CM | POA: Diagnosis present

## 2021-01-01 DIAGNOSIS — E162 Hypoglycemia, unspecified: Secondary | ICD-10-CM

## 2021-01-01 DIAGNOSIS — Z9114 Patient's other noncompliance with medication regimen: Secondary | ICD-10-CM | POA: Diagnosis not present

## 2021-01-01 DIAGNOSIS — G928 Other toxic encephalopathy: Secondary | ICD-10-CM | POA: Diagnosis present

## 2021-01-01 DIAGNOSIS — T405X1A Poisoning by cocaine, accidental (unintentional), initial encounter: Principal | ICD-10-CM | POA: Diagnosis present

## 2021-01-01 DIAGNOSIS — E11649 Type 2 diabetes mellitus with hypoglycemia without coma: Secondary | ICD-10-CM | POA: Diagnosis present

## 2021-01-01 DIAGNOSIS — J9601 Acute respiratory failure with hypoxia: Secondary | ICD-10-CM | POA: Diagnosis present

## 2021-01-01 DIAGNOSIS — G40909 Epilepsy, unspecified, not intractable, without status epilepticus: Secondary | ICD-10-CM | POA: Diagnosis present

## 2021-01-01 DIAGNOSIS — Z791 Long term (current) use of non-steroidal anti-inflammatories (NSAID): Secondary | ICD-10-CM | POA: Diagnosis not present

## 2021-01-01 DIAGNOSIS — I959 Hypotension, unspecified: Secondary | ICD-10-CM

## 2021-01-01 DIAGNOSIS — T50901A Poisoning by unspecified drugs, medicaments and biological substances, accidental (unintentional), initial encounter: Secondary | ICD-10-CM | POA: Diagnosis present

## 2021-01-01 DIAGNOSIS — R4189 Other symptoms and signs involving cognitive functions and awareness: Secondary | ICD-10-CM

## 2021-01-01 DIAGNOSIS — R404 Transient alteration of awareness: Secondary | ICD-10-CM | POA: Diagnosis present

## 2021-01-01 LAB — BLOOD GAS, ARTERIAL
Acid-base deficit: 9.8 mmol/L — ABNORMAL HIGH (ref 0.0–2.0)
Acid-base deficit: 6.3 mmol/L — ABNORMAL HIGH (ref 0.0–2.0)
Acid-base deficit: 3.1 mmol/L — ABNORMAL HIGH (ref 0.0–2.0)
Allens test (pass/fail): POSITIVE — AB
Bicarbonate: 18 mmol/L — ABNORMAL LOW (ref 20.0–28.0)
Bicarbonate: 21.4 mmol/L (ref 20.0–28.0)
Bicarbonate: 21.2 mmol/L (ref 20.0–28.0)
FIO2: 0.4
FIO2: 0.6
FIO2: 0.4
MECHVT: 500 mL
MECHVT: 500 mL
MECHVT: 500 mL
Mechanical Rate: 16
O2 Saturation: 91.4 %
O2 Saturation: 99.5 %
O2 Saturation: 98.9 %
PEEP: 5 cmH2O
PEEP: 5 cmH2O
PEEP: 5 cmH2O
Patient temperature: 37
Patient temperature: 37
Patient temperature: 37
RATE: 16 resp/min
RATE: 20 resp/min
pCO2 arterial: 46 mmHg (ref 32.0–48.0)
pCO2 arterial: 50 mmHg — ABNORMAL HIGH (ref 32.0–48.0)
pCO2 arterial: 35 mmHg (ref 32.0–48.0)
pH, Arterial: 7.2 — ABNORMAL LOW (ref 7.350–7.450)
pH, Arterial: 7.24 — ABNORMAL LOW (ref 7.350–7.450)
pH, Arterial: 7.39 (ref 7.350–7.450)
pO2, Arterial: 76 mmHg — ABNORMAL LOW (ref 83.0–108.0)
pO2, Arterial: 190 mmHg — ABNORMAL HIGH (ref 83.0–108.0)
pO2, Arterial: 127 mmHg — ABNORMAL HIGH (ref 83.0–108.0)

## 2021-01-01 LAB — GLUCOSE, CAPILLARY
Glucose-Capillary: 99 mg/dL (ref 70–99)
Glucose-Capillary: 110 mg/dL — ABNORMAL HIGH (ref 70–99)
Glucose-Capillary: 78 mg/dL (ref 70–99)
Glucose-Capillary: 93 mg/dL (ref 70–99)
Glucose-Capillary: 102 mg/dL — ABNORMAL HIGH (ref 70–99)
Glucose-Capillary: 61 mg/dL — ABNORMAL LOW (ref 70–99)
Glucose-Capillary: 63 mg/dL — ABNORMAL LOW (ref 70–99)
Glucose-Capillary: 82 mg/dL (ref 70–99)
Glucose-Capillary: 44 mg/dL — CL (ref 70–99)
Glucose-Capillary: 63 mg/dL — ABNORMAL LOW (ref 70–99)
Glucose-Capillary: 79 mg/dL (ref 70–99)
Glucose-Capillary: 38 mg/dL — CL (ref 70–99)
Glucose-Capillary: 142 mg/dL — ABNORMAL HIGH (ref 70–99)

## 2021-01-01 LAB — VALPROIC ACID LEVEL: Valproic Acid Lvl: <10 ug/mL — ABNORMAL LOW (ref 50.0–100.0)

## 2021-01-01 LAB — COMPREHENSIVE METABOLIC PANEL
ALT: 25 U/L (ref 0–44)
AST: 23 U/L (ref 15–41)
Albumin: 3.9 g/dL (ref 3.5–5.0)
Alkaline Phosphatase: 93 U/L (ref 38–126)
Anion gap: 10 (ref 5–15)
BUN: 14 mg/dL (ref 6–20)
CO2: 24 mmol/L (ref 22–32)
Calcium: 8.5 mg/dL — ABNORMAL LOW (ref 8.9–10.3)
Chloride: 105 mmol/L (ref 98–111)
Creatinine, Ser: 1.25 mg/dL — ABNORMAL HIGH (ref 0.61–1.24)
GFR, Estimated: >60 mL/min (ref >60)
Glucose, Bld: 69 mg/dL — ABNORMAL LOW (ref 70–99)
Potassium: 3.3 mmol/L — ABNORMAL LOW (ref 3.5–5.1)
Sodium: 139 mmol/L (ref 135–145)
Total Bilirubin: 0.5 mg/dL (ref 0.3–1.2)
Total Protein: 7 g/dL (ref 6.5–8.1)

## 2021-01-01 LAB — URINE DRUG SCREEN, QUALITATIVE (ARMC ONLY)
Amphetamines, Ur Screen: NOT DETECTED
Barbiturates, Ur Screen: NOT DETECTED
Benzodiazepine, Ur Scrn: NOT DETECTED
Cannabinoid 50 Ng, Ur ~~LOC~~: NOT DETECTED
Cocaine Metabolite,Ur ~~LOC~~: POSITIVE — AB
MDMA (Ecstasy)Ur Screen: NOT DETECTED
Methadone Scn, Ur: NOT DETECTED
Opiate, Ur Screen: NOT DETECTED
Phencyclidine (PCP) Ur S: NOT DETECTED
Tricyclic, Ur Screen: NOT DETECTED

## 2021-01-01 LAB — BASIC METABOLIC PANEL
Anion gap: 12 (ref 5–15)
BUN: 13 mg/dL (ref 6–20)
CO2: 21 mmol/L — ABNORMAL LOW (ref 22–32)
Calcium: 8.3 mg/dL — ABNORMAL LOW (ref 8.9–10.3)
Chloride: 105 mmol/L (ref 98–111)
Creatinine, Ser: 1.3 mg/dL — ABNORMAL HIGH (ref 0.61–1.24)
GFR, Estimated: >60 mL/min (ref >60)
Glucose, Bld: 75 mg/dL (ref 70–99)
Potassium: 3.5 mmol/L (ref 3.5–5.1)
Sodium: 138 mmol/L (ref 135–145)

## 2021-01-01 LAB — CORTISOL-AM, BLOOD: Cortisol - AM: 43 ug/dL — ABNORMAL HIGH (ref 6.7–22.6)

## 2021-01-01 LAB — CBC WITH DIFFERENTIAL/PLATELET
Abs Immature Granulocytes: 0.03 10*3/uL (ref 0.00–0.07)
Basophils Absolute: 0 10*3/uL (ref 0.0–0.1)
Basophils Relative: 1 %
Eosinophils Absolute: 0 10*3/uL (ref 0.0–0.5)
Eosinophils Relative: 0 %
HCT: 41.4 % (ref 39.0–52.0)
Hemoglobin: 13.9 g/dL (ref 13.0–17.0)
Immature Granulocytes: 0 %
Lymphocytes Relative: 25 %
Lymphs Abs: 1.7 10*3/uL (ref 0.7–4.0)
MCH: 31 pg (ref 26.0–34.0)
MCHC: 33.6 g/dL (ref 30.0–36.0)
MCV: 92.2 fL (ref 80.0–100.0)
Monocytes Absolute: 0.6 10*3/uL (ref 0.1–1.0)
Monocytes Relative: 8 %
Neutro Abs: 4.4 10*3/uL (ref 1.7–7.7)
Neutrophils Relative %: 66 %
Platelets: 194 10*3/uL (ref 150–400)
RBC: 4.49 MIL/uL (ref 4.22–5.81)
RDW: 12.1 % (ref 11.5–15.5)
WBC: 6.8 10*3/uL (ref 4.0–10.5)
nRBC: 0 % (ref 0.0–0.2)

## 2021-01-01 LAB — CBC
HCT: 41.1 % (ref 39.0–52.0)
HCT: 43.5 % (ref 39.0–52.0)
Hemoglobin: 13.6 g/dL (ref 13.0–17.0)
Hemoglobin: 14.5 g/dL (ref 13.0–17.0)
MCH: 30.9 pg (ref 26.0–34.0)
MCH: 31.2 pg (ref 26.0–34.0)
MCHC: 33.1 g/dL (ref 30.0–36.0)
MCHC: 33.3 g/dL (ref 30.0–36.0)
MCV: 93.4 fL (ref 80.0–100.0)
MCV: 93.5 fL (ref 80.0–100.0)
Platelets: 191 10*3/uL (ref 150–400)
Platelets: 155 10*3/uL (ref 150–400)
RBC: 4.4 MIL/uL (ref 4.22–5.81)
RBC: 4.65 MIL/uL (ref 4.22–5.81)
RDW: 12.3 % (ref 11.5–15.5)
RDW: 12.4 % (ref 11.5–15.5)
WBC: 10 10*3/uL (ref 4.0–10.5)
WBC: 11.8 10*3/uL — ABNORMAL HIGH (ref 4.0–10.5)
nRBC: 0 % (ref 0.0–0.2)
nRBC: 0 % (ref 0.0–0.2)

## 2021-01-01 LAB — URINALYSIS, COMPLETE (UACMP) WITH MICROSCOPIC
Bacteria, UA: NONE SEEN
Bilirubin Urine: NEGATIVE
Glucose, UA: >=500 mg/dL — AB
Hgb urine dipstick: NEGATIVE
Ketones, ur: NEGATIVE mg/dL
Leukocytes,Ua: NEGATIVE
Nitrite: NEGATIVE
Protein, ur: 30 mg/dL — AB
Specific Gravity, Urine: 1.006 (ref 1.005–1.030)
Squamous Epithelial / HPF: NONE SEEN (ref 0–5)
WBC, UA: NONE SEEN WBC/hpf (ref 0–5)
pH: 5 (ref 5.0–8.0)

## 2021-01-01 LAB — CREATININE, SERUM
Creatinine, Ser: 1.44 mg/dL — ABNORMAL HIGH (ref 0.61–1.24)
GFR, Estimated: 57 mL/min — ABNORMAL LOW (ref >60)

## 2021-01-01 LAB — LACTIC ACID, PLASMA
Lactic Acid, Venous: 2.4 mmol/L (ref 0.5–1.9)
Lactic Acid, Venous: 2.7 mmol/L (ref 0.5–1.9)

## 2021-01-01 LAB — LIPASE, BLOOD
Lipase: 123 U/L — ABNORMAL HIGH (ref 11–51)
Lipase: 52 U/L — ABNORMAL HIGH (ref 11–51)

## 2021-01-01 LAB — MRSA PCR SCREENING: MRSA by PCR: NEGATIVE

## 2021-01-01 LAB — SALICYLATE LEVEL: Salicylate Lvl: <7 mg/dL — ABNORMAL LOW (ref 7.0–30.0)

## 2021-01-01 LAB — MAGNESIUM: Magnesium: 2 mg/dL (ref 1.7–2.4)

## 2021-01-01 LAB — CBG MONITORING, ED
Glucose-Capillary: 44 mg/dL — CL (ref 70–99)
Glucose-Capillary: 94 mg/dL (ref 70–99)
Glucose-Capillary: 57 mg/dL — ABNORMAL LOW (ref 70–99)

## 2021-01-01 LAB — ACETAMINOPHEN LEVEL: Acetaminophen (Tylenol), Serum: <10 ug/mL — ABNORMAL LOW (ref 10–30)

## 2021-01-01 LAB — PHOSPHORUS: Phosphorus: 3.2 mg/dL (ref 2.5–4.6)

## 2021-01-01 LAB — PROTIME-INR
INR: 1.1 (ref 0.8–1.2)
Prothrombin Time: 14 seconds (ref 11.4–15.2)

## 2021-01-01 LAB — RESP PANEL BY RT-PCR (FLU A&B, COVID) ARPGX2
Influenza A by PCR: NEGATIVE
Influenza B by PCR: NEGATIVE
SARS Coronavirus 2 by RT PCR: NEGATIVE

## 2021-01-01 LAB — TROPONIN I (HIGH SENSITIVITY)
Troponin I (High Sensitivity): 17 ng/L (ref <18)
Troponin I (High Sensitivity): 18 ng/L — ABNORMAL HIGH (ref <18)

## 2021-01-01 LAB — ETHANOL: Alcohol, Ethyl (B): 158 mg/dL — ABNORMAL HIGH (ref <10)

## 2021-01-01 LAB — CK: Total CK: 172 U/L (ref 49–397)

## 2021-01-01 MED ORDER — ACETAMINOPHEN 650 MG RE SUPP: 650.0000 mg | Freq: Four times a day (QID) | RECTAL | Status: DC | PRN

## 2021-01-01 MED ORDER — ELDERTONIC PO LIQD
15.0000 mL | Freq: Every day | ORAL | Status: DC
  Filled 2021-01-01: qty 15

## 2021-01-01 MED ORDER — DEXTROSE 50 % IV SOLN
1.0000 | Freq: Once | INTRAVENOUS | Status: AC
  Administered 2021-01-01: 50 mL via INTRAVENOUS

## 2021-01-01 MED ORDER — SODIUM BICARBONATE 8.4 % IV SOLN
50.0000 meq | Freq: Once | INTRAVENOUS | Status: AC
  Administered 2021-01-01: 50 meq via INTRAVENOUS
  Filled 2021-01-01: qty 50

## 2021-01-01 MED ORDER — DEXTROSE 50 % IV SOLN
INTRAVENOUS | Status: AC
  Administered 2021-01-01: 50 mL via INTRAVENOUS
  Filled 2021-01-01: qty 50

## 2021-01-01 MED ORDER — SODIUM CHLORIDE 0.9 % IV BOLUS
1000.0000 mL | Freq: Once | INTRAVENOUS | Status: AC
  Administered 2021-01-01: 1000 mL via INTRAVENOUS

## 2021-01-01 MED ORDER — PANTOPRAZOLE SODIUM 40 MG IV SOLR
40.0000 mg | INTRAVENOUS | Status: DC
  Administered 2021-01-01 – 2021-01-02 (×2): 40 mg via INTRAVENOUS
  Filled 2021-01-01 (×2): qty 40

## 2021-01-01 MED ORDER — MIDAZOLAM 50MG/50ML (1MG/ML) PREMIX INFUSION
0.5000 mg/h | INTRAVENOUS | Status: DC
  Administered 2021-01-01: 0.5 mg/h via INTRAVENOUS
  Filled 2021-01-01: qty 50

## 2021-01-01 MED ORDER — CHLORHEXIDINE GLUCONATE CLOTH 2 % EX PADS
6.0000 | MEDICATED_PAD | Freq: Every day | CUTANEOUS | Status: DC
  Administered 2021-01-01 – 2021-01-02 (×2): 6 via TOPICAL

## 2021-01-01 MED ORDER — LEVETIRACETAM IN NACL 1500 MG/100ML IV SOLN
1500.0000 mg | Freq: Once | INTRAVENOUS | Status: AC
  Administered 2021-01-01: 1500 mg via INTRAVENOUS
  Filled 2021-01-01: qty 100

## 2021-01-01 MED ORDER — LORAZEPAM 2 MG/ML IJ SOLN: 1.0000 mg | INTRAMUSCULAR | Status: DC | PRN

## 2021-01-01 MED ORDER — ROCURONIUM BROMIDE 50 MG/5ML IV SOLN
INTRAVENOUS | Status: AC | PRN
  Administered 2021-01-01: 100 mg via INTRAVENOUS

## 2021-01-01 MED ORDER — DEXTROSE 10 % IV SOLN: INTRAVENOUS | Status: DC

## 2021-01-01 MED ORDER — NOREPINEPHRINE 4 MG/250ML-% IV SOLN
INTRAVENOUS | Status: AC
  Filled 2021-01-01: qty 250

## 2021-01-01 MED ORDER — ENOXAPARIN SODIUM 40 MG/0.4ML ~~LOC~~ SOLN
40.0000 mg | SUBCUTANEOUS | Status: DC
  Administered 2021-01-01 – 2021-01-02 (×2): 40 mg via SUBCUTANEOUS
  Filled 2021-01-01 (×2): qty 0.4

## 2021-01-01 MED ORDER — DEXTROSE 50 % IV SOLN: 12.5000 g | INTRAVENOUS | Status: DC

## 2021-01-01 MED ORDER — DOCUSATE SODIUM 50 MG/5ML PO LIQD
100.0000 mg | Freq: Every day | ORAL | Status: DC
  Administered 2021-01-01: 100 mg
  Filled 2021-01-01: qty 10

## 2021-01-01 MED ORDER — DOCUSATE SODIUM 100 MG PO CAPS: 100.0000 mg | ORAL_CAPSULE | Freq: Two times a day (BID) | ORAL | Status: DC | PRN

## 2021-01-01 MED ORDER — SODIUM CHLORIDE 0.9 % IV SOLN: 3000.0000 mg | INTRAVENOUS | Status: DC

## 2021-01-01 MED ORDER — SODIUM CHLORIDE 0.9 % IV BOLUS: 1000.0000 mL | Freq: Once | INTRAVENOUS | Status: DC

## 2021-01-01 MED ORDER — DEXTROSE 50 % IV SOLN
INTRAVENOUS | Status: AC
  Filled 2021-01-01: qty 50

## 2021-01-01 MED ORDER — SODIUM CHLORIDE 0.9 % IV SOLN
1.0000 mg | Freq: Every day | INTRAVENOUS | Status: DC
  Administered 2021-01-01 – 2021-01-02 (×2): 1 mg via INTRAVENOUS
  Filled 2021-01-01 (×3): qty 0.2

## 2021-01-01 MED ORDER — NOREPINEPHRINE 4 MG/250ML-% IV SOLN
2.0000 ug/min | INTRAVENOUS | Status: DC
  Administered 2021-01-01: 10 ug/min via INTRAVENOUS
  Administered 2021-01-01: 5 ug/min via INTRAVENOUS
  Filled 2021-01-01: qty 250

## 2021-01-01 MED ORDER — ELDERTONIC PO LIQD
15.0000 mL | Freq: Every day | ORAL | Status: DC
  Filled 2021-01-01 (×2): qty 15

## 2021-01-01 MED ORDER — DEXMEDETOMIDINE HCL IN NACL 400 MCG/100ML IV SOLN
0.4000 ug/kg/h | INTRAVENOUS | Status: DC
  Administered 2021-01-01: 0.4 ug/kg/h via INTRAVENOUS
  Filled 2021-01-01: qty 100

## 2021-01-01 MED ORDER — FENTANYL 2500MCG IN NS 250ML (10MCG/ML) PREMIX INFUSION
0.0000 ug/h | INTRAVENOUS | Status: DC
  Administered 2021-01-01: 100 ug/h via INTRAVENOUS
  Filled 2021-01-01: qty 250

## 2021-01-01 MED ORDER — POLYETHYLENE GLYCOL 3350 17 G PO PACK: 17.0000 g | PACK | Freq: Every day | ORAL | Status: DC | PRN

## 2021-01-01 MED ORDER — SODIUM CHLORIDE 0.9 % IV SOLN: 250.0000 mL | INTRAVENOUS | Status: DC

## 2021-01-01 MED ORDER — LEVETIRACETAM IN NACL 500 MG/100ML IV SOLN
500.0000 mg | Freq: Two times a day (BID) | INTRAVENOUS | Status: DC
  Administered 2021-01-01 – 2021-01-02 (×3): 500 mg via INTRAVENOUS
  Filled 2021-01-01 (×5): qty 100

## 2021-01-01 MED ORDER — DEXTROSE 50 % IV SOLN
25.0000 mL | Freq: Once | INTRAVENOUS | Status: AC
  Administered 2021-01-01: 25 mL via INTRAVENOUS

## 2021-01-01 MED ORDER — CHLORHEXIDINE GLUCONATE 0.12% ORAL RINSE (MEDLINE KIT)
15.0000 mL | Freq: Two times a day (BID) | OROMUCOSAL | Status: DC
  Administered 2021-01-01 – 2021-01-02 (×3): 15 mL via OROMUCOSAL

## 2021-01-01 MED ORDER — SODIUM BICARBONATE 8.4 % IV SOLN
100.0000 meq | Freq: Once | INTRAVENOUS | Status: AC
  Administered 2021-01-01: 100 meq via INTRAVENOUS
  Filled 2021-01-01: qty 50

## 2021-01-01 MED ORDER — DEXTROSE 50 % IV SOLN: 1.0000 | Freq: Once | INTRAVENOUS | Status: AC

## 2021-01-01 MED ORDER — VASOPRESSIN 20 UNITS/100 ML INFUSION FOR SHOCK
0.0000 [IU]/min | INTRAVENOUS | Status: DC
  Filled 2021-01-01: qty 100

## 2021-01-01 MED ORDER — DEXTROSE 50 % IV SOLN
INTRAVENOUS | Status: AC
  Administered 2021-01-01: 1 via INTRAVENOUS
  Filled 2021-01-01: qty 50

## 2021-01-01 MED ORDER — ACETAMINOPHEN 325 MG PO TABS: 650.0000 mg | ORAL_TABLET | ORAL | Status: DC | PRN

## 2021-01-01 MED ORDER — DEXTROSE 50 % IV SOLN
12.5000 g | INTRAVENOUS | Status: AC
  Administered 2021-01-01: 12.5 g via INTRAVENOUS
  Filled 2021-01-01: qty 50

## 2021-01-01 MED ORDER — ORAL CARE MOUTH RINSE
15.0000 mL | OROMUCOSAL | Status: DC
  Administered 2021-01-01 – 2021-01-02 (×5): 15 mL via OROMUCOSAL

## 2021-01-01 MED ORDER — ONDANSETRON HCL 4 MG/2ML IJ SOLN: 4.0000 mg | Freq: Four times a day (QID) | INTRAMUSCULAR | Status: DC | PRN

## 2021-01-01 MED ORDER — LACTATED RINGERS IV BOLUS
1000.0000 mL | Freq: Once | INTRAVENOUS | Status: AC
  Administered 2021-01-01: 1000 mL via INTRAVENOUS

## 2021-01-01 MED ORDER — HYDROCORTISONE NA SUCCINATE PF 100 MG IJ SOLR
50.0000 mg | Freq: Four times a day (QID) | INTRAMUSCULAR | Status: DC
  Administered 2021-01-01 – 2021-01-02 (×6): 50 mg via INTRAVENOUS
  Filled 2021-01-01 (×6): qty 2

## 2021-01-01 MED ORDER — ETOMIDATE 2 MG/ML IV SOLN
INTRAVENOUS | Status: AC | PRN
  Administered 2021-01-01: 10 mg via INTRAVENOUS

## 2021-01-01 MED ORDER — THIAMINE HCL 100 MG/ML IJ SOLN
100.0000 mg | Freq: Every day | INTRAMUSCULAR | Status: DC
  Administered 2021-01-01 – 2021-01-02 (×2): 100 mg via INTRAVENOUS
  Filled 2021-01-01 (×2): qty 2

## 2021-01-01 NOTE — Progress Notes (Signed)
eLink Physician-Brief Progress Note Patient Name: Brandon Silva DOB: Mar 27, 1961 MRN: [id number]   Date of Service  01/01/2021  HPI/Events of Note  Patient admitted with altered mental status due to poly drug overdose, unknown whether it was a suicidal attempt , patient intubated on arrival to the ED for airway protection and hypoventilation. Patient was also hypotensive, requiring volume resuscitation.  eICU Interventions  New patient Evaluation completed.        Thomasene Lot Desmin Daleo 01/01/2021, 4:39 AM

## 2021-01-01 NOTE — Procedures (Incomplete)
Patient Name: Brandon Silva  MRN: [id number]  Epilepsy Attending: Charlsie Quest  Referring Physician/Provider: Kathlee Nations, NP Date: 01/01/2021 Duration:   Patient history: 59 year old male with history of seizures, alcohol use, cocaine use now admitted with acute encephalopathy.  EEG to evaluate for seizures.  Level of alertness: Awake, drowsy, sleep, comatose, lethargic  AEDs during EEG study: Keppra   Technical aspects: This EEG study was done with scalp electrodes positioned according to the 10-20 International system of electrode placement. Electrical activity was acquired at a sampling rate of 500Hz  and reviewed with a high frequency filter of 70Hz  and a low frequency filter of 1Hz . EEG data were recorded continuously and digitally stored.   Description: The posterior dominant rhythm consists of 9-10 Hz activity of moderate voltage (25-35 uV) seen predominantly in posterior head regions, symmetric and reactive to eye opening and eye closing. Drowsiness was characterized by attenuation of the posterior background rhythm. Sleep was characterized by vertex waves, sleep spindles (12 to 14 Hz), maximal frontocentral region.  There is an excessive amount of 15 to 18 Hz, 2-3 uV beta activity with irregular morphology distributed symmetrically and diffusely.   EEG showed continuous/intermittent generalized 3 to 6 Hz theta-delta slowing.  Hyperventilation did not show any EEG change.  Physiology photic driving was not seen during photic stimulation.  Hyperventilation and photic stimulation were not performed.     ABNORMALITY -Excessive beta, generalized -Intermittent slow, generalized -Continued slow, generalized -Triphasic waves, generalized -Sharp wave, -Spike, -Periodic epileptiform discharges, generalized/lateralized   IMPRESSION: This study is within normal limits.  The study suggestive of mild/moderate/severe diffuse encephalopathy, nonspecific etiology but likely related to  sedation, toxic-metabolic etiology, anoxic/hypoxic brain injury This study showed evidence of potential epileptogenicity arising from The study showed seizures arising from No seizures or epileptiform discharges were seen throughout the recording. However, only wakefulness and drowsiness were recorded. If suspicion for interictal activity remains a concern, a prolonged study including sleep should be considered.  The excessive beta activity seen in the background is most likely due to the effect of benzodiazepine and is a benign EEG pattern.   [name] 

## 2021-01-01 NOTE — ED Notes (Signed)
Report received from Shannon RN. Patient care assumed. Patient/RN introduction complete. Will continue to monitor.  

## 2021-01-01 NOTE — Progress Notes (Incomplete)
Initial Nutrition Assessment  DOCUMENTATION CODES:      INTERVENTION:   Vital 1.5 @55ml /hr- Initiate at 61ml/hr and advance by 28ml/hr q 8 hours until goal rate is reached.  Pro-Source 75ml QID via tube, provides 40kcal and 11g of protein per serving   Free water flushes 52ml q4 hours to maintain tube patency   Regimen provides 2140kcal/day, 133g/day protein and 1156ml/day free water   Pt at high refeed risk; recommend monitor potassium, magnesium and phosphorus labs daily until stable  NUTRITION DIAGNOSIS:     related to   as evidenced by  .  GOAL:      MONITOR:      REASON FOR ASSESSMENT:   Ventilator    ASSESSMENT:   60 y/o male with h/o seizures, DM and substance abuse who is admitted with overdose  Pt sedated and ventilated. OGT in place. Suspect pt with decreased appetite and oral intake at baseline r/t substance abuse. Per chart, pt is down 14lbs(6%) since August; RD unsure how recently weight loss occurred.   Medications reviewed and include: colace, lovenox, solu-cortef, protonix, thiamine, precedex, 10% dextrose @100ml /hr, folic acid  Labs reviewed: K 3.5 wnl, creat 1.30(H), P 3.2 wnl, Mg 2.0 wnl Wbc- 11.8(H) cbgs- 57, 99, 110, 78, 93 x 24 hrs  Patient is currently intubated on ventilator support MV: 11.0 L/min Temp (24hrs), Avg:97.1 F (36.2 C), Min:95.2 F (35.1 C), Max:98.8 F (37.1 C)  Propofol: none   MAP- >10mmHg  NUTRITION - FOCUSED PHYSICAL EXAM:  {RD Focused Exam List:21252}  Diet Order:   Diet Order            Diet NPO time specified  Diet effective now                EDUCATION NEEDS:      Skin:  Skin Assessment: Reviewed RN Assessment  Last BM:  pta  Height:   Ht Readings from Last 1 Encounters:  01/01/21 6\' 2"  (1.88 m)    Weight:   Wt Readings from Last 1 Encounters:  01/01/21 98.1 kg    Ideal Body Weight:  86.36 kg  BMI:  Body mass index is 27.77 kg/m.  Estimated Nutritional Needs:   Kcal:   2129kcal/day  Protein:  135-150g/day  Fluid:  2.6-2.9L/day  03/01/21 MS, RD, LDN Please refer to [facility] for RD and/or RD on-call/weekend/after hours pager

## 2021-01-01 NOTE — ED Triage Notes (Signed)
Pt to ED unresponive and being bagged with bag valve mask.   Sheriffs department called for welfare check. Alcohol and pill bottles found around pt. agonal respirations upon EMS arrival. Pt did not lose a peripheral pulse. Gag reflex present with EMS, bilateral NPAs in place. EMS reports pt reacted to placement but is calm upon arrival.  CBG 188  4mg  of Narcan administered with EMS. (2mg  IM and 2mg  IV no effect) Four pill bottles found empty. All prescribed to pts girlfriend. Ativan 1mg , Bactrim, Citalopram 40mg  and omeprazole 20mg .

## 2021-01-01 NOTE — Plan of Care (Signed)
Patient arrived from ER at approximately 0400 vented on Levo and Precedex.  Responds to pain.

## 2021-01-01 NOTE — Progress Notes (Signed)
Mother Guinevere Ferrari at bedside updated on patient's status.  Got good phone numbers in case of emergency.

## 2021-01-01 NOTE — ED Provider Notes (Addendum)
[facility] Emergency Department Provider Note  ____________________________________________   Event Date/Time   First MD Initiated Contact with Patient 01/01/21 0018     (approximate)  I have reviewed the triage vital signs and the nursing notes.   HISTORY  Chief Complaint Unresponsive  Level 5 caveat:  history/ROS limited by acute/critical illness  HPI Brandon Silva is a 60 y.o. male with medical history as listed below who presents by EMS as emergency traffic for unresponsiveness.   [facility] was limited but [facility] by concerned neighbors or family.  Empty pill bottles were found around him and were prescribed to his girlfriend and they include Ativan, Bactrim, citalopram, and omeprazole.  He is known to use alcohol but is unclear if he uses alcohol tonight.  EMS reports that the patient was unresponsive upon arrival with agonal respirations but did not lose a peripheral pulse.  He had a light gag reflex for them and initially responded to the placement of bilateral nasopharyngeal airways but only with the initial placement.  His initial blood glucose of 188.  EMS administered 2 mg of intranasal Narcan and then two additional milligrams of IV Narcan with no effect.  Patient is unresponsive with BVM assisted respirations upon arrival.         Past Medical History:  Diagnosis Date  . Alcohol abuse   . Diabetes mellitus without complication (HCC)   . Seizures [facility](HCC)     Patient Active Problem List   Diagnosis Date Noted  . Recurrent seizures (HCC) 07/20/2020  . Acute respiratory failure (HCC) 09/20/2016  . Seizures (HCC) 09/17/2016  . Ventilator dependence (HCC)   . Hypocalcemia   . AKI (acute kidney injury) (HCC)   . Acute encephalopathy   . ETOH abuse   . Uncontrolled type 2 diabetes mellitus with ketoacidosis without coma, without long-term current use of insulin (HCC)   . Seizure (HCC)  02/04/2016    No past surgical history on file.  Prior to Admission medications   Medication Sig Start Date End Date Taking? Authorizing Provider  acetaminophen (TYLENOL) 325 MG tablet Take 325-650 mg by mouth every 6 (six) hours as needed for headache (or pain).    [provider]  divalproex (DEPAKOTE) 500 MG DR tablet Take 1 tablet (500 mg total) by mouth 3 (three) times daily. 08/17/20   Shaune PollackIsaacs, Cameron, MD  insulin NPH-regular Human (NOVOLIN 70/30) (70-30) 100 UNIT/ML injection As directed 07/20/20   Emily FilbertWilliams, Jonathan E, MD  LORazepam (ATIVAN) 0.5 MG tablet Take 1 tablet (0.5 mg total) by mouth every 8 (eight) hours as needed for anxiety. 07/20/20 07/20/21  Emily FilbertWilliams, Jonathan E, MD  meloxicam (MOBIC) 15 MG tablet Take 1 tablet (15 mg total) by mouth daily. 07/20/20   Emily FilbertWilliams, Jonathan E, MD    Allergies Penicillins  Family History  Problem Relation Age of Onset  . Diabetes Other     Social History Social History   Tobacco Use  . Smoking status: Current Every Day Smoker    Types: Cigarettes  . Smokeless tobacco: Never Used  Substance Use Topics  . Alcohol use: Yes  . Drug use: No    Review of Systems Level 5 caveat:  history/ROS limited by acute/critical illness ____________________________________________   PHYSICAL EXAM:  VITAL SIGNS: ED Triage Vitals  Enc Vitals Group     BP 01/01/21 0015 (!) 83/69     Pulse Rate 01/01/21 0015 83     Resp  01/01/21 0015 17     Temp 01/01/21 0030 (!) 95.2 F (35.1 C)     Temp Source 01/01/21 0030 Bladder     SpO2 01/01/21 0015 95 %     Weight --      Height --      Head Circumference --      Peak Flow --      Pain Score --      Pain Loc --      Pain Edu? --      Excl. in GC? --     Constitutional: Unresponsive. Eyes: Conjunctiva injected, no chemosis.  Pupils are constricted and unresponsive bilaterally. Head: Atraumatic. Nose: No epistaxis nor clear rhinorrhea. Mouth/Throat: Airway is clear of  debris. Neck: No stridor.  No meningeal signs.  No crepitus. Cardiovascular: Normal rate, regular rhythm. Good peripheral circulation. Respiratory: Agonal respirations, assisted with BVM. Gastrointestinal: Soft and nontender. No distention.  Musculoskeletal: No lower extremity tenderness nor edema. No gross deformities of extremities. Neurologic: GCS 3.  Patient has no response to painful stimuli from me and no corneal reflex and no gag reflex. Skin:  Skin is warm, dry and intact.   ____________________________________________   LABS (all labs ordered are listed, but only abnormal results are displayed)  Labs Reviewed  COMPREHENSIVE METABOLIC PANEL - Abnormal; Notable for the following components:      Result Value   Potassium 3.3 (*)    Glucose, Bld 69 (*)    Creatinine, Ser 1.25 (*)    Calcium 8.5 (*)    All other components within normal limits  ETHANOL - Abnormal; Notable for the following components:   Alcohol, Ethyl (B) 158 (*)    All other components within normal limits  LIPASE, BLOOD - Abnormal; Notable for the following components:   Lipase 123 (*)    All other components within normal limits  LACTIC ACID, PLASMA - Abnormal; Notable for the following components:   Lactic Acid, Venous 2.7 (*)    All other components within normal limits  SALICYLATE LEVEL - Abnormal; Notable for the following components:   Salicylate Lvl <7.0 (*)    All other components within normal limits  ACETAMINOPHEN LEVEL - Abnormal; Notable for the following components:   Acetaminophen (Tylenol), Serum <10 (*)    All other components within normal limits  BLOOD GAS, ARTERIAL - Abnormal; Notable for the following components:   pH, Arterial 7.20 (*)    pO2, Arterial 76 (*)    Bicarbonate 18.0 (*)    Acid-base deficit 9.8 (*)    All other components within normal limits  URINALYSIS, COMPLETE (UACMP) WITH MICROSCOPIC - Abnormal; Notable for the following components:   Color, Urine STRAW (*)     APPearance CLEAR (*)    Glucose, UA >=500 (*)    Protein, ur 30 (*)    All other components within normal limits  URINE DRUG SCREEN, QUALITATIVE ([facility] ONLY) - Abnormal; Notable for the following components:   Cocaine Metabolite,Ur [place] POSITIVE (*)    All other components within normal limits  VALPROIC ACID LEVEL - Abnormal; Notable for the following components:   Valproic Acid Lvl <10 (*)    All other components within normal limits  CBG MONITORING, ED - Abnormal; Notable for the following components:   Glucose-Capillary 44 (*)    All other components within normal limits  RESP PANEL BY RT-PCR (FLU A&B, COVID) ARPGX2  CBC WITH DIFFERENTIAL/PLATELET  PROTIME-INR  LACTIC ACID, PLASMA  CK  CBG MONITORING,  ED  TROPONIN I (HIGH SENSITIVITY)  TROPONIN I (HIGH SENSITIVITY)   ____________________________________________  EKG  ED ECG REPORT I, Loleta Rose, the attending physician, personally viewed and interpreted this ECG.  Date: 01/01/2021 EKG Time: 00: 12 Rate: 74 Rhythm: normal sinus rhythm QRS Axis: normal Intervals: Prolonged QTC at 548 ms ST/T Wave abnormalities: normal Narrative Interpretation: no evidence of acute ischemia  ____________________________________________  RADIOLOGY I, Loleta Rose, personally viewed and evaluated these images (plain radiographs) as part of my medical decision making, as well as reviewing the written report by the radiologist.  ED MD interpretation: No acute abnormalities identified on CT head nor CT cervical spine.  Chest x-ray shows no acute abnormalities, but the endotracheal tube was too far above the carina and was advanced by 2 cm after the image was taken.  Official radiology report(s): CT Head Wo Contrast  Result Date: 01/01/2021 CLINICAL DATA:  Altered mental status, agonal respirations, neck trauma EXAM: CT HEAD WITHOUT CONTRAST CT CERVICAL SPINE WITHOUT CONTRAST TECHNIQUE: Multidetector CT imaging of the head and cervical spine  was performed following the standard protocol without intravenous contrast. Multiplanar CT image reconstructions of the cervical spine were also generated. COMPARISON:  None. FINDINGS: CT HEAD FINDINGS Brain: Normal anatomic configuration. No abnormal intra or extra-axial mass lesion or fluid collection. No abnormal mass effect or midline shift. No evidence of acute intracranial hemorrhage or infarct. Ventricular size is normal. Cerebellum unremarkable. Vascular: Unremarkable Skull: Intact Sinuses/Orbits: Paranasal sinuses are clear. Orbits are unremarkable. Other: Mastoid air cells and middle ear cavities are clear. CT CERVICAL SPINE FINDINGS Alignment: Normal cervical lordosis.  No listhesis. Skull base and vertebrae: The craniocervical junction is unremarkable. The atlantodental interval is normal. There is no acute fracture of the cervical spine. Mild loss of height of the C5 and C6 vertebral bodies is likely degenerative in nature. No lytic or blastic bone lesions are seen. Soft tissues and spinal canal: No canal hematoma. The paraspinal soft tissues are unremarkable. There is mild central canal stenosis with flattening of the thecal sac at C5-6 secondary to a posterior disc osteophyte complex at this level. The AP diameter of the canal at this level is approximately 8 mm. Disc levels: Review of the sagittal images demonstrates intervertebral disc space narrowing and endplate remodeling at C5-6 in keeping with changes of moderate degenerative disc disease. Remaining intervertebral disc heights are preserved. Mild loss of height of the C5 and C6 vertebral bodies, as noted above, is likely degenerative in nature. Remaining vertebral body heights are preserved. Endotracheal tube and nasogastric tube are seen within the prevertebral soft tissues. Upper chest: Unremarkable Other: None IMPRESSION: No acute intracranial abnormality.  No calvarial fracture. No acute fracture or listhesis of the cervical spine.  Electronically Signed   By: Helyn Numbers MD   On: 01/01/2021 02:30   CT Cervical Spine Wo Contrast  Result Date: 01/01/2021 CLINICAL DATA:  Altered mental status, agonal respirations, neck trauma EXAM: CT HEAD WITHOUT CONTRAST CT CERVICAL SPINE WITHOUT CONTRAST TECHNIQUE: Multidetector CT imaging of the head and cervical spine was performed following the standard protocol without intravenous contrast. Multiplanar CT image reconstructions of the cervical spine were also generated. COMPARISON:  None. FINDINGS: CT HEAD FINDINGS Brain: Normal anatomic configuration. No abnormal intra or extra-axial mass lesion or fluid collection. No abnormal mass effect or midline shift. No evidence of acute intracranial hemorrhage or infarct. Ventricular size is normal. Cerebellum unremarkable. Vascular: Unremarkable Skull: Intact Sinuses/Orbits: Paranasal sinuses are clear. Orbits are unremarkable. Other: Mastoid  air cells and middle ear cavities are clear. CT CERVICAL SPINE FINDINGS Alignment: Normal cervical lordosis.  No listhesis. Skull base and vertebrae: The craniocervical junction is unremarkable. The atlantodental interval is normal. There is no acute fracture of the cervical spine. Mild loss of height of the C5 and C6 vertebral bodies is likely degenerative in nature. No lytic or blastic bone lesions are seen. Soft tissues and spinal canal: No canal hematoma. The paraspinal soft tissues are unremarkable. There is mild central canal stenosis with flattening of the thecal sac at C5-6 secondary to a posterior disc osteophyte complex at this level. The AP diameter of the canal at this level is approximately 8 mm. Disc levels: Review of the sagittal images demonstrates intervertebral disc space narrowing and endplate remodeling at V3-7 in keeping with changes of moderate degenerative disc disease. Remaining intervertebral disc heights are preserved. Mild loss of height of the C5 and C6 vertebral bodies, as noted above, is  likely degenerative in nature. Remaining vertebral body heights are preserved. Endotracheal tube and nasogastric tube are seen within the prevertebral soft tissues. Upper chest: Unremarkable Other: None IMPRESSION: No acute intracranial abnormality.  No calvarial fracture. No acute fracture or listhesis of the cervical spine. Electronically Signed   By: Fidela Salisbury MD   On: 01/01/2021 02:30   DG Chest Port 1 View  Result Date: 01/01/2021 CLINICAL DATA:  Intubated EXAM: PORTABLE CHEST 1 VIEW COMPARISON:  08/17/2020 FINDINGS: Single frontal view of the chest demonstrates endotracheal tube overlying tracheal air column tip at level of thoracic inlet. Enteric catheter passes below diaphragm tip overlies gastric fundus. External defibrillator pads overlie the cardiac apex. Cardiac silhouette is unremarkable. No airspace disease, effusion, or pneumothorax. A vertical lucency in the left paraspinal region may be related to the adjacent defibrillator pad or a skin fold. This does not have the typical appearance for pneumomediastinum. IMPRESSION: 1. Support devices as above. 2. No acute airspace disease. 3. Vertical lucency along the left lower thoracic margin, felt to represent a skin fold or an overlying object rather than pneumomediastinum. Electronically Signed   By: Randa Ngo M.D.   On: 01/01/2021 00:56    ____________________________________________   PROCEDURES   Procedure(s) performed (including Critical Care):  .Critical Care Performed by: Hinda Kehr, MD Authorized by: Hinda Kehr, MD   Critical care provider statement:    Critical care time (minutes):  60   Critical care time was exclusive of:  Separately billable procedures and treating other patients   Critical care was necessary to treat or prevent imminent or life-threatening deterioration of the following conditions:  Respiratory failure and CNS failure or compromise   Critical care was time spent personally by me on the  following activities:  Development of treatment plan with patient or surrogate, discussions with consultants, evaluation of patient's response to treatment, examination of patient, obtaining history from patient or surrogate, ordering and performing treatments and interventions, ordering and review of laboratory studies, ordering and review of radiographic studies, pulse oximetry, re-evaluation of patient's condition and review of old charts .1-3 Lead EKG Interpretation Performed by: Hinda Kehr, MD Authorized by: Hinda Kehr, MD     Interpretation: normal     ECG rate:  80   ECG rate assessment: normal     Rhythm: sinus rhythm     Ectopy: none     Conduction: normal   Procedure Name: Intubation Date/Time: 01/01/2021 12:20 AM Performed by: Hinda Kehr, MD Pre-anesthesia Checklist: Patient identified, Emergency Drugs available, Suction available  and Patient being monitored Oxygen Delivery Method: Ambu bag Preoxygenation: Pre-oxygenation with 100% oxygen Induction Type: IV induction and Rapid sequence Ventilation: Mask ventilation without difficulty Laryngoscope Size: Glidescope Tube size: 7.5 mm Number of attempts: 2 (Patient initially was not fully paralyzed, withdrew to allow rocuronium to fully take effect in order to protect vocal cords, then proceeded successfully) Placement Confirmation: ETT inserted through vocal cords under direct vision,  CO2 detector and Breath sounds checked- equal and bilateral Secured at: 25 cm Tube secured with: ETT holder Dental Injury: Teeth and Oropharynx as per pre-operative assessment         ____________________________________________   INITIAL IMPRESSION / MDM / ASSESSMENT AND PLAN / ED COURSE  As part of my medical decision making, I reviewed the following data within the electronic MEDICAL RECORD NUMBER Nursing notes reviewed and incorporated, Labs reviewed , EKG interpreted , Old chart reviewed, Radiograph reviewed , Discussed with  admitting physician , Notes from prior ED visits and [place] Controlled Substance Database   Differential diagnosis includes, but is not limited to, intoxication of one or more substances, intentional overdose versus accidental overdose, electrolyte or metabolic abnormality, CVA (hemorrhagic or ischemic), trauma/neurogenic, seizure/postictal state, sepsis less likely but still possible.  The patient is on the cardiac monitor to evaluate for evidence of arrhythmia and/or significant heart rate changes.  Patient was GCS of three upon arrival, no corneal reflex no gag reflex, no response to painful stimuli.  Patient has already received a total of 4 mg of Narcan via intranasal and IV administration.  Intubated to protect patient's airway.  Blood pressure is low, providing 2 L of normal saline bolus.  Bladder temperature is low but is unclear if this is from exposure or less likely sepsis.  Holding off on empiric antibiotics as he does not meet sepsis criteria and there are much more likely reasons for had to be unresponsive as indicated above.  CT scans of the head and cervical spine pending.  EKG unremarkable with no sign of ischemia, does have slightly prolonged QTc interval but otherwise no concerning interval abnormalities.     Clinical Course as of 01/01/21 0331  Tue Jan 01, 2021  0030 I reviewed the medical record and see that he has a history of status epilepticus and is supposed to be taking Depakote but has a history of nonadherence to his regimen.  Given the probability that he is postictal and has had seizure activity, I am giving him a loading dose of Keppra 3 g IV. [CF]  0039 CBC with Differential CBC unremarkable with no leukocytosis and a normal hemoglobin [CF]  0052 Patient is persistently hypotensive with a MAP around 55.  He is still getting his fluid boluses but I am starting peripheral Levophed per protocol.  He is going to CT. [CF]  0054 Alcohol, Ethyl (B)(!): 158 Elevated but  unremarkable, does not explain extend of AMS [CF]  0054 Urinalysis, Complete w Microscopic Urine, Catheterized(!) Unremarkable UA [CF]  0054 Lactic Acid, Venous(!!): 2.7 Mild lactic acid elevation.  The patient is noted to have a lactate>2. With the current information available to me, I don't think the patient is in septic shock. The lactate>2, is related to possible seizure activity and/or respiratory distress/ respiratory failure.  Patient's temperature is coming up without external warming, suspect patient is not actually significantly hypothermic.  [CF]  0056 Lipase(!): 123 Mild elevation of unclear clinical significance [CF]  0057 Comprehensive metabolic panel(!) Mild AKI and mild hypokalemia, also blood glucose is  82 which is surprising given his initial EMS a glucose of 188.  We will recheck a CBG. [CF]  0118 Patient's map is about 42.  He is on Levophed 10 mcg/min.  Fluid bolus is continuing.  I am very concerned about intracranial pathology given his hypotension, slight bradycardia, mild hypothermia, and now we checked a fingerstick blood sugar and it has dropped to 44.  I gave him an ampule of D50 and ordered a D10 infusion.  Coingestions are also possible but thus far his labs are unremarkable except as described above and except for his hyperglycemia.  He is cocaine positive but otherwise the urine drug screen is negative.  His Covid test is negative.  Salicylate and acetaminophen levels are negative.  I will admit to the ICU if his CT scan does not demonstrate anything requiring transfer to another facility. [CF]  0120 DG Chest Port 1 View No acute abnormalities on chest x-ray.  Based on my own personal review of the x-ray, I asked respiratory therapy to advance the tube by about 2 cm, because it appears to be about 5 cm above the carina. [CF]  0138 Blood gas, arterial(!) ABG is generally unremarkable other than a slightly decreased pH [CF]  0142 Valproic Acid,S(!): <10 [CF]  0210 CT  Head Wo Contrast I personally reviewed the CT scan of the head and see no obvious acute abnormalities although of course we will await the radiologist's interpretation. [CF]  0234 Radiology confirmed that there are no obvious acute abnormalities on CT head nor CT cervical spine.  Patient is still unresponsive on no sedation.  Map has stabilized at 65 on pressors.  Calling ICU for admission. [CF]  0235 Ordering 3rd L (of LR).  Spoke with Annabelle Harman in the ICU, she will admit. [CF]  0331 Glucose-Capillary(!): 57 Giving D50 before taking patient to ICU [CF]    Clinical Course User Index [CF] Loleta Rose, MD     ____________________________________________  FINAL CLINICAL IMPRESSION(S) / ED DIAGNOSES  Final diagnoses:  Unresponsive  Acute respiratory failure, unspecified whether with hypoxia or hypercapnia (HCC)  Hypotension, unspecified hypotension type  Acute kidney injury (HCC)  Hypoglycemia  Elevated lactic acid level  Cocaine abuse (HCC)     MEDICATIONS GIVEN DURING THIS VISIT:  Medications  fentaNYL in NS (1mcg/ml) infusion-PREMIX (50 mcg/hr Intravenous Rate/Dose Change 01/01/21 0041)  midazolam (VERSED) 50 mg/50 mL (1 mg/mL) premix infusion (0.5 mg/hr Intravenous New Bag/Given 01/01/21 0053)  0.9 %  sodium chloride infusion (has no administration in time range)  norepinephrine (LEVOPHED) 4mg  in premix infusion (10 mcg/min Intravenous Infusion Verify 01/01/21 0118)  dextrose 10 % infusion ( Intravenous New Bag/Given 01/01/21 0138)  lactated ringers bolus 1,000 mL (has no administration in time range)  rocuronium (ZEMURON) injection (100 mg Intravenous Given 01/01/21 0012)  etomidate (AMIDATE) injection (10 mg Intravenous Given 01/01/21 0011)  sodium chloride 0.9 % bolus 1,000 mL (0 mLs Intravenous Stopped 01/01/21 0049)  levETIRAcetam (KEPPRA) IVPB 1500 mg/ 100 mL premix ( Intravenous Stopped 01/01/21 0103)    Followed by  levETIRAcetam (KEPPRA) IVPB 1500 mg/ 100 mL premix  (0 mg Intravenous Stopped 01/01/21 0137)  sodium chloride 0.9 % bolus 1,000 mL (0 mLs Intravenous Stopped 01/01/21 0118)  dextrose 50 % solution 50 mL (50 mLs Intravenous Given 01/01/21 0108)     ED Discharge Orders    None      *Please note:  Munir Victorian was evaluated in Emergency Department on 01/01/2021 for the symptoms  described in the history of present illness. He was evaluated in the context of the global COVID-19 pandemic, which necessitated consideration that the patient might be at risk for infection with the SARS-CoV-2 virus that causes COVID-19. Institutional protocols and algorithms that pertain to the evaluation of patients at risk for COVID-19 are in a state of rapid change based on information released by regulatory bodies including the CDC and federal and state organizations. These policies and algorithms were followed during the patient's care in the ED.  Some ED evaluations and interventions may be delayed as a result of limited staffing during and after the pandemic.*  Note:  This document was prepared using Dragon voice recognition software and may include unintentional dictation errors.   Loleta Rose, MD 01/01/21 9163    Loleta Rose, MD 01/01/21 0330

## 2021-01-01 NOTE — Progress Notes (Signed)
Verbal order from Dr York Cerise advanced tube from 25 cm to 27 cm. Increased fio2 to 60%.

## 2021-01-01 NOTE — ED Notes (Signed)
Report called to [facility] RN, pt admitted to ICU.

## 2021-01-01 NOTE — ED Notes (Signed)
CBG of 44

## 2021-01-01 NOTE — ED Notes (Signed)
Sister and mother updated to the best of this RNs ability.

## 2021-01-01 NOTE — H&P (Addendum)
NAME:  Brandon Silva, MRN:  [id number], DOB:  1961-08-08, LOS: 0 ADMISSION DATE:  01/01/2021, CONSULTATION DATE: 01/02/2020 REFERRING MD: Dr. Karma Greaser, CHIEF COMPLAINT: Unresponsiveness   Brief History:  60 yo male admitted with acute encephalopathy and acute hypoxic respiratory failure following a drug overdose, alcohol intoxication, and possible posttical state requiring mechanical intubation for airway protection  History of Present Illness:  This is a 60 yo male who presented to [facility] ER via EMS on 01/4 following a welfare check by the [place] during which he was found unresponsive with alcohol and pill bottles surrounding him.  It was reported the empty pill bottles surrounding the pt were prescribed to his girlfriend they included: ativan, bactrim, citalopram, and omeprazole.  EMS reported upon their arrival pt noted to have agonal respirations with a mild gag reflex, but never lost his pulse.  EMS placed bilateral nasopharyngeal airways and pt responded briefly.  He had an initial CBG reading of 188.  Due to continued unresponsiveness EMS administered 2 mg of intranasal narcan followed by 2 additional mg of iv narcan with no effect.  Upon arrival to the ER pt remained unresponsive requiring bag mask ventilation, requiring mechanical intubation for airway protection by ER physician.  Lab results revealed K+ 3.3, glucose 69, creatinine 1.25, lipase 123, lactic acid 2.7, UA negative for UTI but revealed >=500 glucose, alcohol level 132, salicylate level <4.4, and urine drug screen positive for cocaine.  COVID-19/Influenza PCR negative and CXR revealed no acute airspace disease.  Pt does have a known seizure hx with noncompliance with anticonvulsant medications, therefore while in the ER he received 3 grams of iv keppra.  Pt also hypotensive requiring 2L fluid bolus, however levophed gtt started due to continued hypotension.    Past Medical History:  Seizures (hx of noncompliance with  anticonvulsant medication  Type II Diabetes Mellitus  ETOH Abuse   Significant Hospital Events:  01/4: Admitted to ICU mechanically intubated   Consults:  Intensivist   Procedures:  Mechanical Intubation 01/4>>  Significant Diagnostic Tests:  CT Head 01/4>>No acute intracranial abnormality.  No calvarial fracture CT Cervical Spine 01/4>>No acute fracture or listhesis of the cervical spine  Micro Data:  COVID-19/Influenza PCR 01/4>>negative   Antimicrobials:  None   Interim History / Subjective:  Pt currently mechanically intubated   Objective   Blood pressure (!) 71/56, pulse 70, temperature (!) 97.2 F (36.2 C), resp. rate 19, SpO2 90 %.        Intake/Output Summary (Last 24 hours) at 01/01/2021 0122 Last data filed at 01/01/2021 0118 Gross per 24 hour  Intake 2107.13 ml  Output -  Net 2107.13 ml   There were no vitals filed for this visit.  Examination: General: acutely ill appearing male, NAD mechanically intubated  HENT: supple, no JVD  Lungs: clear throughout, even, non tachypneic   Cardiovascular: nsr, rrr, no R/G, 1+ radial pulses, no edema  Abdomen: +BS x4, soft, non distended  Extremities: normal bulk and tone Neuro: unresponsive, not following commands, bilateral pupils 3 mm very sluggish  Assessment & Plan:   Acute hypoxic respiratory failure secondary to drug overdose and alcohol intoxication  Mechanical intubation  Full vent support for now-vent settings reviewed and established  SBT once all parameters met  VAP bundle implemented   Hypotension likely secondary to sedating medication Continuous telemetry monitoring  Fluid resuscitation and levophed gtt to maintain map >65   Hypokalemia Metabolic and lactic acidosis  Trend BMP and lactic acid Repeat ABG pending  Replace electrolytes as indicated  Monitor UOP 2 amps of sodium bicarb   Hypoglycemia  Hx: Type II diabetes mellitus  CBG's q2hrs for now  Continue D10W @100  ml/hr Will check  cortisol level    Acute encephalopathy secondary to drug overdose, alcohol intoxication, and possible postictal state  Cocaine abuse  Mechanical intubation pain/discomfort  Hx: Seizures (noncompliace with anticonvulsant medications) Maintain RASS goal 0 to -1 WUA daily  EEG pending Continue iv keppra  Once extubated pt will need a psychiatric consult and sitter at bedside due to uncertainty if this was a suicidal attempt Will need polysubstance abuse cessation counseling once he is extubated   Best practice (evaluated daily)  Diet: NPO Pain/Anxiety/Delirium protocol (if indicated): Fentanyl gtt VAP protocol (if indicated): ordered  DVT prophylaxis: subq lovenox  GI prophylaxis: iv protonix  Glucose control: non indicated pt hypoglycemia  Mobility: bedrest  Disposition: ICU   Goals of Care:  Last date of multidisciplinary goals of care discussion: N/A Family and staff present: N/A Summary of discussion: N/A Follow up goals of care discussion due: 01/01/2021 Code Status: Full Code   Labs   CBC: Recent Labs  Lab 01/01/21 0019  WBC 6.8  NEUTROABS 4.4  HGB 13.9  HCT 41.4  MCV 92.2  PLT 950    Basic Metabolic Panel: Recent Labs  Lab 01/01/21 0019  NA 139  K 3.3*  CL 105  CO2 24  GLUCOSE 69*  BUN 14  CREATININE 1.25*  CALCIUM 8.5*   GFR: CrCl cannot be calculated (Unknown ideal weight.). Recent Labs  Lab 01/01/21 0019  WBC 6.8  LATICACIDVEN 2.7*    Liver Function Tests: Recent Labs  Lab 01/01/21 0019  AST 23  ALT 25  ALKPHOS 93  BILITOT 0.5  PROT 7.0  ALBUMIN 3.9   Recent Labs  Lab 01/01/21 0019  LIPASE 123*   No results for input(s): AMMONIA in the last 168 hours.  ABG    Component Value Date/Time   PHART 7.20 (L) 01/01/2021 0020   PCO2ART 46 01/01/2021 0020   PO2ART 76 (L) 01/01/2021 0020   HCO3 18.0 (L) 01/01/2021 0020   TCO2 23 09/17/2016 0606   ACIDBASEDEF 9.8 (H) 01/01/2021 0020   O2SAT 91.4 01/01/2021 0020     Coagulation  Profile: Recent Labs  Lab 01/01/21 0019  INR 1.1    Cardiac Enzymes: No results for input(s): CKTOTAL, CKMB, CKMBINDEX, TROPONINI in the last 168 hours.  HbA1C: No results found for: HGBA1C  CBG: Recent Labs  Lab 01/01/21 0107  GLUCAP 44*    Review of Systems:   Unable to assess pt mechanically intubated   Past Medical History:  He,  has a past medical history of Alcohol abuse, Diabetes mellitus without complication ([place]), and Seizures ([place]).   Surgical History:  No past surgical history on file.   Social History:   reports that he has been smoking cigarettes. He has never used smokeless tobacco. He reports current alcohol use. He reports that he does not use drugs.   Family History:  His family history includes Diabetes in an other family member.   Allergies Allergies  Allergen Reactions  . Penicillins Rash    Has patient had a PCN reaction causing immediate rash, facial/tongue/throat swelling, SOB or lightheadedness with hypotension: Yes Has patient had a PCN reaction causing severe rash involving mucus membranes or skin necrosis: No Has patient had a PCN reaction that required hospitalization: No Has patient had a PCN reaction occurring within the last  10 years: No If all of the above answers are "NO", then may proceed with Cephalosporin use.      Home Medications  Prior to Admission medications   Medication Sig Start Date End Date Taking? Authorizing Provider  acetaminophen (TYLENOL) 325 MG tablet Take 325-650 mg by mouth every 6 (six) hours as needed for headache (or pain).    [provider]  divalproex (DEPAKOTE) 500 MG DR tablet Take 1 tablet (500 mg total) by mouth 3 (three) times daily. 08/17/20   Duffy Bruce, MD  insulin NPH-regular Human (NOVOLIN 70/30) (70-30) 100 UNIT/ML injection As directed 07/20/20   Earleen Newport, MD  LORazepam (ATIVAN) 0.5 MG tablet Take 1 tablet (0.5 mg total) by mouth every 8 (eight) hours as needed for  anxiety. 07/20/20 07/20/21  Earleen Newport, MD  meloxicam (MOBIC) 15 MG tablet Take 1 tablet (15 mg total) by mouth daily. 07/20/20   Earleen Newport, MD     Critical care time: 45 minutes     Updated pts mother Dearies Meikle via telephone regarding pt condition and current plan of care all questions were answered.   Marda Stalker, [place] Pager [phone] (please enter 7 digits) PCCM Consult Pager [phone] (please enter 7 digits)

## 2021-01-02 DIAGNOSIS — R4182 Altered mental status, unspecified: Secondary | ICD-10-CM

## 2021-01-02 DIAGNOSIS — R410 Disorientation, unspecified: Secondary | ICD-10-CM

## 2021-01-02 LAB — CBC
HCT: 39.1 % (ref 39.0–52.0)
Hemoglobin: 13.4 g/dL (ref 13.0–17.0)
MCH: 30.9 pg (ref 26.0–34.0)
MCHC: 34.3 g/dL (ref 30.0–36.0)
MCV: 90.3 fL (ref 80.0–100.0)
Platelets: 142 10*3/uL — ABNORMAL LOW (ref 150–400)
RBC: 4.33 MIL/uL (ref 4.22–5.81)
RDW: 12.7 % (ref 11.5–15.5)
WBC: 10.6 10*3/uL — ABNORMAL HIGH (ref 4.0–10.5)
nRBC: 0 % (ref 0.0–0.2)

## 2021-01-02 LAB — BASIC METABOLIC PANEL
Anion gap: 6 (ref 5–15)
Anion gap: 13 (ref 5–15)
BUN: 16 mg/dL (ref 6–20)
BUN: 15 mg/dL (ref 6–20)
CO2: 26 mmol/L (ref 22–32)
CO2: 22 mmol/L (ref 22–32)
Calcium: 8.6 mg/dL — ABNORMAL LOW (ref 8.9–10.3)
Calcium: 8.6 mg/dL — ABNORMAL LOW (ref 8.9–10.3)
Chloride: 105 mmol/L (ref 98–111)
Chloride: 105 mmol/L (ref 98–111)
Creatinine, Ser: 1.75 mg/dL — ABNORMAL HIGH (ref 0.61–1.24)
Creatinine, Ser: 1.7 mg/dL — ABNORMAL HIGH (ref 0.61–1.24)
GFR, Estimated: 45 mL/min — ABNORMAL LOW (ref >60)
GFR, Estimated: 46 mL/min — ABNORMAL LOW (ref >60)
Glucose, Bld: 108 mg/dL — ABNORMAL HIGH (ref 70–99)
Glucose, Bld: 110 mg/dL — ABNORMAL HIGH (ref 70–99)
Potassium: 3.7 mmol/L (ref 3.5–5.1)
Potassium: 3.7 mmol/L (ref 3.5–5.1)
Sodium: 137 mmol/L (ref 135–145)
Sodium: 140 mmol/L (ref 135–145)

## 2021-01-02 LAB — HEPATIC FUNCTION PANEL
ALT: 506 U/L — ABNORMAL HIGH (ref 0–44)
AST: 1023 U/L — ABNORMAL HIGH (ref 15–41)
Albumin: 3.2 g/dL — ABNORMAL LOW (ref 3.5–5.0)
Alkaline Phosphatase: 64 U/L (ref 38–126)
Bilirubin, Direct: 0.2 mg/dL (ref 0.0–0.2)
Indirect Bilirubin: 0.7 mg/dL (ref 0.3–0.9)
Total Bilirubin: 0.9 mg/dL (ref 0.3–1.2)
Total Protein: 6 g/dL — ABNORMAL LOW (ref 6.5–8.1)

## 2021-01-02 LAB — GLUCOSE, CAPILLARY
Glucose-Capillary: 104 mg/dL — ABNORMAL HIGH (ref 70–99)
Glucose-Capillary: 107 mg/dL — ABNORMAL HIGH (ref 70–99)
Glucose-Capillary: 111 mg/dL — ABNORMAL HIGH (ref 70–99)
Glucose-Capillary: 114 mg/dL — ABNORMAL HIGH (ref 70–99)
Glucose-Capillary: 116 mg/dL — ABNORMAL HIGH (ref 70–99)
Glucose-Capillary: 119 mg/dL — ABNORMAL HIGH (ref 70–99)
Glucose-Capillary: 31 mg/dL — CL (ref 70–99)
Glucose-Capillary: 37 mg/dL — CL (ref 70–99)
Glucose-Capillary: 46 mg/dL — ABNORMAL LOW (ref 70–99)
Glucose-Capillary: 61 mg/dL — ABNORMAL LOW (ref 70–99)
Glucose-Capillary: 66 mg/dL — ABNORMAL LOW (ref 70–99)
Glucose-Capillary: 85 mg/dL (ref 70–99)
Glucose-Capillary: 87 mg/dL (ref 70–99)

## 2021-01-02 LAB — PHOSPHORUS: Phosphorus: 2 mg/dL — ABNORMAL LOW (ref 2.5–4.6)

## 2021-01-02 LAB — TSH: TSH: 0.728 u[IU]/mL (ref 0.350–4.500)

## 2021-01-02 LAB — MAGNESIUM: Magnesium: 1.8 mg/dL (ref 1.7–2.4)

## 2021-01-02 MED ORDER — ACETAMINOPHEN 325 MG PO TABS: 650.0000 mg | ORAL_TABLET | ORAL | Status: DC | PRN

## 2021-01-02 MED ORDER — DEXTROSE 50 % IV SOLN
25.0000 g | INTRAVENOUS | Status: DC | PRN
  Administered 2021-01-02 (×5): 25 g via INTRAVENOUS
  Filled 2021-01-02 (×2): qty 50

## 2021-01-02 MED ORDER — DEXTROSE 50 % IV SOLN: 1.0000 | Freq: Once | INTRAVENOUS | Status: DC

## 2021-01-02 MED ORDER — DOCUSATE SODIUM 50 MG/5ML PO LIQD: 100.0000 mg | Freq: Every day | ORAL | Status: DC

## 2021-01-02 MED ORDER — ADULT MULTIVITAMIN W/MINERALS CH: 1.0000 | ORAL_TABLET | Freq: Every day | ORAL | Status: DC

## 2021-01-02 MED ORDER — GLUCAGON HCL RDNA (DIAGNOSTIC) 1 MG IJ SOLR
1.0000 mg | Freq: Once | INTRAMUSCULAR | Status: AC | PRN
  Administered 2021-01-02: 1 mg via INTRAVENOUS
  Filled 2021-01-02: qty 1

## 2021-01-02 MED ORDER — ADULT MULTIVITAMIN LIQUID CH
15.0000 mL | Freq: Every day | ORAL | Status: DC
  Filled 2021-01-02: qty 15

## 2021-01-02 NOTE — Progress Notes (Signed)
Removed patient's foley catheter, tolerated well.  Patient is wanting to go home.  Explained patient is IVC and psychiatry would need to see him.  Messaged Dr. Toni Amend with no response.

## 2021-01-02 NOTE — Consult Note (Signed)
[facility] Face-to-Face Psychiatry Consult   Reason for Consult: Consult for this 60 year old man who came to the hospital unresponsive than had to be managed in the ICU briefly Referring Physician:  Lanney Gins Patient Identification: Brandon Silva MRN:  [id number] Principal Diagnosis: Acute delirium Diagnosis:  Principal Problem:   Acute delirium Active Problems:   ETOH abuse   Drug overdose   Total Time spent with patient: 1 hour  Subjective:   Brandon Silva is a 60 y.o. male patient admitted with "I really do not remember".  HPI: Patient seen chart reviewed.  60 year old man brought to the emergency room after being found unresponsive at home.  Patient at that time appeared to be intoxicated but multiple pill bottles were reportedly found nearby as well.  Patient was administered Narcan without response.  Required brief management in the ICU for failure to protect airway.  Now extubated and awake.  The patient says he has no memory of coming to the ER.  The last thing he remembers is that he was at home by himself drinking.  He declines to estimate how much alcohol he had consumed but says that overall he feels he is not drinking as much as he used to and that he has not felt like the problem has been worsening.  He denies that he has been using any other drugs.  Patient says from what he can recall his mood recently has been fine.  Denies any feelings of depression or hopelessness.  He denies having had any kind of suicidal thoughts at all that he is aware of and feels certain that he had not taken any pills and that if he had it would not of been with any suicidal intent.  Today he completely denies suicidal ideation.  He is not having any hallucinations.  He is alert and oriented.  Urine drug screen did not have benzodiazepines in it suggesting that he did not overdose on any sedatives.  He did have cocaine positive.  He denies to me remembering doing any cocaine.  Past Psychiatric History: Patient  has a history of alcohol abuse which has caused emergency room visits and hospitalizations in the past.  No reported history of DTs.  He has a history of a seizure disorder part of which might be related to alcohol abuse.  Patient denies ever having tried to kill himself in the past.  Risk to Self:   Risk to Others:   Prior Inpatient Therapy:   Prior Outpatient Therapy:    Past Medical History:  Past Medical History:  Diagnosis Date  . Alcohol abuse   . Diabetes mellitus without complication ([place])   . Seizures ([place])    No past surgical history on file. Family History:  Family History  Problem Relation Age of Onset  . Diabetes Other    Family Psychiatric  History: Alcohol abuse and some other family members Social History:  Social History   Substance and Sexual Activity  Alcohol Use Yes     Social History   Substance and Sexual Activity  Drug Use No    Social History   Socioeconomic History  . Marital status: Single    Spouse name: Not on file  . Number of children: Not on file  . Years of education: Not on file  . Highest education level: Not on file  Occupational History  . Not on file  Tobacco Use  . Smoking status: Current Every Day Smoker    Types: Cigarettes  . Smokeless tobacco: Never  Used  Substance and Sexual Activity  . Alcohol use: Yes  . Drug use: No  . Sexual activity: Yes  Other Topics Concern  . Not on file  Social History Narrative  . Not on file   Social Determinants of Health   Financial Resource Strain: Not on file  Food Insecurity: Not on file  Transportation Needs: Not on file  Physical Activity: Not on file  Stress: Not on file  Social Connections: Not on file   Additional Social History:    Allergies:   Allergies  Allergen Reactions  . Penicillins Rash    Has patient had a PCN reaction causing immediate rash, facial/tongue/throat swelling, SOB or lightheadedness with hypotension: Yes Has patient had a PCN reaction causing  severe rash involving mucus membranes or skin necrosis: No Has patient had a PCN reaction that required hospitalization: No Has patient had a PCN reaction occurring within the last 10 years: No If all of the above answers are "NO", then may proceed with Cephalosporin use.     Labs:  Results for orders placed or performed during the hospital encounter of 01/01/21 (from the past 48 hour(s))  Comprehensive metabolic panel     Status: Abnormal   Collection Time: 01/01/21 12:19 AM  Result Value Ref Range   Sodium 139 135 - 145 mmol/L   Potassium 3.3 (L) 3.5 - 5.1 mmol/L   Chloride 105 98 - 111 mmol/L   CO2 24 22 - 32 mmol/L   Glucose, Bld 69 (L) 70 - 99 mg/dL    Comment: Glucose reference range applies only to samples taken after fasting for at least 8 hours.   BUN 14 6 - 20 mg/dL   Creatinine, Ser 1.25 (H) 0.61 - 1.24 mg/dL   Calcium 8.5 (L) 8.9 - 10.3 mg/dL   Total Protein 7.0 6.5 - 8.1 g/dL   Albumin 3.9 3.5 - 5.0 g/dL   AST 23 15 - 41 U/L   ALT 25 0 - 44 U/L   Alkaline Phosphatase 93 38 - 126 U/L   Total Bilirubin 0.5 0.3 - 1.2 mg/dL   GFR, Estimated >60 >60 mL/min    Comment: (NOTE) Calculated using the CKD-EPI Creatinine Equation (2021)    Anion gap 10 5 - 15    Comment: Performed at [facility], [address], [place], [place] [address]  Ethanol     Status: Abnormal   Collection Time: 01/01/21 12:19 AM  Result Value Ref Range   Alcohol, Ethyl (B) 158 (H) <10 mg/dL    Comment: (NOTE) Lowest detectable limit for serum alcohol is 10 mg/dL.  For medical purposes only. Performed at [facility], [address] [place], [place] [address]   Lipase, blood     Status: Abnormal   Collection Time: 01/01/21 12:19 AM  Result Value Ref Range   Lipase 123 (H) 11 - 51 U/L    Comment: Performed at [facility], [place], [place] [address]  Troponin I (High Sensitivity)     Status: None   Collection Time: 01/01/21 12:19 AM  Result  Value Ref Range   Troponin I (High Sensitivity) 17 <18 ng/L    Comment: (NOTE) Elevated high sensitivity troponin I (hsTnI) values and significant  changes across serial measurements may suggest ACS but many other  chronic and acute conditions are known to elevate hsTnI results.  Refer to the "Links" section for chest pain algorithms and additional  guidance. Performed at [facility], [phone] [place], [place] [address]   Lactic acid, plasma     Status: Abnormal   Collection Time: 01/01/21 12:19 AM  Result Value Ref Range   Lactic Acid, Venous 2.7 (HH) 0.5 - 1.9 mmol/L    Comment: CRITICAL RESULT CALLED TO, READ BACK BY AND VERIFIED WITH SHANNON MARTIN @0052  ON 01/01/21 SKL Performed at [facility], [address] [place], [place] [address]   Salicylate level     Status: Abnormal   Collection Time: 01/01/21 12:19 AM  Result Value Ref Range   Salicylate Lvl <0.0 (L) 7.0 - 30.0 mg/dL    Comment: Performed at [facility], [address] [place], [place] [address]  Acetaminophen level     Status: Abnormal   Collection Time: 01/01/21 12:19 AM  Result Value Ref Range   Acetaminophen (Tylenol), Serum <10 (L) 10 - 30 ug/mL    Comment: (NOTE) Therapeutic concentrations vary significantly. A range of 10-30 ug/mL  may be an effective concentration for many patients. However, some  are best treated at concentrations outside of this range. Acetaminophen concentrations >150 ug/mL at 4 hours after ingestion  and >50 ug/mL at 12 hours after ingestion are often associated with  toxic reactions.  Performed at [facility], [address] [place], [place] [address]   CBC with Differential     Status: None   Collection Time: 01/01/21 12:19 AM  Result Value Ref Range   WBC 6.8 4.0 - 10.5 K/uL   RBC 4.49 4.22 - 5.81 MIL/uL   Hemoglobin 13.9 13.0 - 17.0 g/dL   HCT 41.4 39.0 - 52.0 %   MCV 92.2 80.0 - 100.0 fL   MCH 31.0 26.0 - 34.0 pg   MCHC  33.6 30.0 - 36.0 g/dL   RDW 12.1 11.5 - 15.5 %   Platelets 194 150 - 400 K/uL   nRBC 0.0 0.0 - 0.2 %   Neutrophils Relative % 66 %   Neutro Abs 4.4 1.7 - 7.7 K/uL   Lymphocytes Relative 25 %   Lymphs Abs 1.7 0.7 - 4.0 K/uL   Monocytes Relative 8 %   Monocytes Absolute 0.6 0.1 - 1.0 K/uL   Eosinophils Relative 0 %   Eosinophils Absolute 0.0 0.0 - 0.5 K/uL   Basophils Relative 1 %   Basophils Absolute 0.0 0.0 - 0.1 K/uL   Immature Granulocytes 0 %   Abs Immature Granulocytes 0.03 0.00 - 0.07 K/uL    Comment: Performed at [facility], [address] [place], [place] [address]  Protime-INR     Status: None   Collection Time: 01/01/21 12:19 AM  Result Value Ref Range   Prothrombin Time 14.0 11.4 - 15.2 seconds   INR 1.1 0.8 - 1.2    Comment: (NOTE) INR goal varies based on device and disease states. Performed at [facility], [address] [place], [place] [address]   Urinalysis, Complete w Microscopic Urine, Catheterized     Status: Abnormal   Collection Time: 01/01/21 12:19 AM  Result Value Ref Range   Color, Urine STRAW (A) YELLOW   APPearance CLEAR (A) CLEAR   Specific Gravity, Urine 1.006 1.005 - 1.030   pH 5.0 5.0 - 8.0   Glucose, UA >=500 (A) NEGATIVE mg/dL   Hgb urine dipstick NEGATIVE NEGATIVE   Bilirubin Urine NEGATIVE NEGATIVE   Ketones, ur NEGATIVE NEGATIVE mg/dL   Protein, ur 30 (A) NEGATIVE mg/dL   Nitrite NEGATIVE NEGATIVE   Leukocytes,Ua NEGATIVE NEGATIVE  WBC, UA NONE SEEN 0 - 5 WBC/hpf   Bacteria, UA NONE SEEN NONE SEEN   Squamous Epithelial / LPF NONE SEEN 0 - 5   Mucus PRESENT     Comment: Performed at [facility], [address], [place], [place] [address]  Urine Drug Screen, Qualitative     Status: Abnormal   Collection Time: 01/01/21 12:19 AM  Result Value Ref Range   Tricyclic, Ur Screen NONE DETECTED NONE DETECTED   Amphetamines, Ur Screen NONE DETECTED NONE DETECTED   MDMA (Ecstasy)Ur Screen NONE DETECTED NONE  DETECTED   Cocaine Metabolite,Ur [place] POSITIVE (A) NONE DETECTED   Opiate, Ur Screen NONE DETECTED NONE DETECTED   Phencyclidine (PCP) Ur S NONE DETECTED NONE DETECTED   Cannabinoid 50 Ng, Ur [place] NONE DETECTED NONE DETECTED   Barbiturates, Ur Screen NONE DETECTED NONE DETECTED   Benzodiazepine, Ur Scrn NONE DETECTED NONE DETECTED   Methadone Scn, Ur NONE DETECTED NONE DETECTED    Comment: (NOTE) Tricyclics + metabolites, urine    Cutoff 1000 ng/mL Amphetamines + metabolites, urine  Cutoff 1000 ng/mL MDMA (Ecstasy), urine              Cutoff 500 ng/mL Cocaine Metabolite, urine          Cutoff 300 ng/mL Opiate + metabolites, urine        Cutoff 300 ng/mL Phencyclidine (PCP), urine         Cutoff 25 ng/mL Cannabinoid, urine                 Cutoff 50 ng/mL Barbiturates + metabolites, urine  Cutoff 200 ng/mL Benzodiazepine, urine              Cutoff 200 ng/mL Methadone, urine                   Cutoff 300 ng/mL  The urine drug screen provides only a preliminary, unconfirmed analytical test result and should not be used for non-medical purposes. Clinical consideration and professional judgment should be applied to any positive drug screen result due to possible interfering substances. A more specific alternate chemical method must be used in order to obtain a confirmed analytical result. Gas chromatography / mass spectrometry (GC/MS) is the preferred confirm atory method. Performed at [facility], [address] [place], [place] [address]   Valproic acid level     Status: Abnormal   Collection Time: 01/01/21 12:19 AM  Result Value Ref Range   Valproic Acid Lvl <10 (L) 50.0 - 100.0 ug/mL    Comment: RESULTS CONFIRMED BY MANUAL DILUTION SKL Performed at [facility], [address] [place], [place] [address]   Blood gas, arterial     Status: Abnormal   Collection Time: 01/01/21 12:20 AM  Result Value Ref Range   FIO2 0.40    Delivery systems VENTILATOR    Mode  ASSIST CONTROL    VT 500 mL   Peep/cpap 5.0 cm H20   pH, Arterial 7.20 (L) 7.350 - 7.450   pCO2 arterial 46 32.0 - 48.0 mmHg   pO2, Arterial 76 (L) 83.0 - 108.0 mmHg   Bicarbonate 18.0 (L) 20.0 - 28.0 mmol/L   Acid-base deficit 9.8 (H) 0.0 - 2.0 mmol/L   O2 Saturation 91.4 %   Patient temperature 37.0    Collection site RIGHT RADIAL    Sample type ARTERIAL DRAW    Allens test (pass/fail) PASS PASS   Mechanical Rate 16     Comment: Performed  at [facility], [address] [place], [place] [address]  Resp Panel by RT-PCR (Flu A&B, Covid) Nasopharyngeal Swab     Status: None   Collection Time: 01/01/21 12:21 AM   Specimen: Nasopharyngeal Swab; Nasopharyngeal(NP) swabs in vial transport medium  Result Value Ref Range   SARS Coronavirus 2 by RT PCR NEGATIVE NEGATIVE    Comment: (NOTE) SARS-CoV-2 target nucleic acids are NOT DETECTED.  The SARS-CoV-2 RNA is generally detectable in upper respiratory specimens during the acute phase of infection. The lowest concentration of SARS-CoV-2 viral copies this assay can detect is 138 copies/mL. A negative result does not preclude SARS-Cov-2 infection and should not be used as the sole basis for treatment or other patient management decisions. A negative result may occur with  improper specimen collection/handling, submission of specimen other than nasopharyngeal swab, presence of viral mutation(s) within the areas targeted by this assay, and inadequate number of viral copies(<138 copies/mL). A negative result must be combined with clinical observations, patient history, and epidemiological information. The expected result is Negative.  Fact Sheet for Patients:  EntrepreneurPulse.com.au  Fact Sheet for Healthcare Providers:  IncredibleEmployment.be  This test is no t yet approved or cleared by the [place] FDA and  has been authorized for detection and/or diagnosis of SARS-CoV-2 by FDA  under an Emergency Use Authorization (EUA). This EUA will remain  in effect (meaning this test can be used) for the duration of the COVID-19 declaration under Section 564(b)(1) of the Act, 21 U.S.C.section 360bbb-3(b)(1), unless the authorization is terminated  or revoked sooner.       Influenza A by PCR NEGATIVE NEGATIVE   Influenza B by PCR NEGATIVE NEGATIVE    Comment: (NOTE) The Xpert Xpress SARS-CoV-2/FLU/RSV plus assay is intended as an aid in the diagnosis of influenza from Nasopharyngeal swab specimens and should not be used as a sole basis for treatment. Nasal washings and aspirates are unacceptable for Xpert Xpress SARS-CoV-2/FLU/RSV testing.  Fact Sheet for Patients: EntrepreneurPulse.com.au  Fact Sheet for Healthcare Providers: IncredibleEmployment.be  This test is not yet approved or cleared by the [place] FDA and has been authorized for detection and/or diagnosis of SARS-CoV-2 by FDA under an Emergency Use Authorization (EUA). This EUA will remain in effect (meaning this test can be used) for the duration of the COVID-19 declaration under Section 564(b)(1) of the Act, 21 U.S.C. section 360bbb-3(b)(1), unless the authorization is terminated or revoked.  Performed at [facility], [address] [place], [place] [address]   CBG monitoring, ED     Status: Abnormal   Collection Time: 01/01/21  1:07 AM  Result Value Ref Range   Glucose-Capillary 44 (LL) 70 - 99 mg/dL    Comment: Glucose reference range applies only to samples taken after fasting for at least 8 hours.  CBG monitoring, ED     Status: None   Collection Time: 01/01/21  1:36 AM  Result Value Ref Range   Glucose-Capillary 94 70 - 99 mg/dL    Comment: Glucose reference range applies only to samples taken after fasting for at least 8 hours.  CBG monitoring, ED     Status: Abnormal   Collection Time: 01/01/21  3:03 AM  Result Value Ref Range    Glucose-Capillary 57 (L) 70 - 99 mg/dL    Comment: Glucose reference range applies only to samples taken after fasting for at least 8 hours.  Lactic acid, plasma     Status: Abnormal   Collection Time: 01/01/21  3:05 AM  Result Value Ref Range   Lactic Acid, Venous 2.4 (HH) 0.5 - 1.9 mmol/L    Comment: CRITICAL VALUE NOTED. VALUE IS CONSISTENT WITH PREVIOUSLY REPORTED/CALLED VALUE [facility] Performed at [facility], [place], [place] [address]   Troponin I (High Sensitivity)     Status: Abnormal   Collection Time: 01/01/21  3:05 AM  Result Value Ref Range   Troponin I (High Sensitivity) 18 (H) <18 ng/L    Comment: (NOTE) Elevated high sensitivity troponin I (hsTnI) values and significant  changes across serial measurements may suggest ACS but many other  chronic and acute conditions are known to elevate hsTnI results.  Refer to the "Links" section for chest pain algorithms and additional  guidance. Performed at [facility], [address] [place], [place] [address]   CK     Status: None   Collection Time: 01/01/21  3:05 AM  Result Value Ref Range   Total CK 172 49 - 397 U/L    Comment: Performed at [facility], [address] [place], [place] [address]  CBC     Status: None   Collection Time: 01/01/21  3:06 AM  Result Value Ref Range   WBC 10.0 4.0 - 10.5 K/uL   RBC 4.40 4.22 - 5.81 MIL/uL   Hemoglobin 13.6 13.0 - 17.0 g/dL   HCT 41.1 39.0 - 52.0 %   MCV 93.4 80.0 - 100.0 fL   MCH 30.9 26.0 - 34.0 pg   MCHC 33.1 30.0 - 36.0 g/dL   RDW 12.3 11.5 - 15.5 %   Platelets 191 150 - 400 K/uL   nRBC 0.0 0.0 - 0.2 %    Comment: Performed at [facility], [address] [place], [place] [address]  Creatinine, serum     Status: Abnormal   Collection Time: 01/01/21  3:06 AM  Result Value Ref Range   Creatinine, Ser 1.44 (H) 0.61 - 1.24 mg/dL   GFR, Estimated 57 (L) >60 mL/min    Comment: (NOTE) Calculated using the CKD-EPI  Creatinine Equation (2021) Performed at [facility], [address] [place], [place] [address]   Glucose, capillary     Status: None   Collection Time: 01/01/21  3:51 AM  Result Value Ref Range   Glucose-Capillary 99 70 - 99 mg/dL    Comment: Glucose reference range applies only to samples taken after fasting for at least 8 hours.  MRSA PCR Screening     Status: None   Collection Time: 01/01/21  4:20 AM   Specimen: Nasal Mucosa; Nasopharyngeal  Result Value Ref Range   MRSA by PCR NEGATIVE NEGATIVE    Comment:        The GeneXpert MRSA Assay (FDA approved for NASAL specimens only), is one component of a comprehensive MRSA colonization surveillance program. It is not intended to diagnose MRSA infection nor to guide or monitor treatment for MRSA infections. Performed at [facility], [address] [place], [place] [address]   Glucose, capillary     Status: Abnormal   Collection Time: 01/01/21  4:50 AM  Result Value Ref Range   Glucose-Capillary 110 (H) 70 - 99 mg/dL    Comment: Glucose reference range applies only to samples taken after fasting for at least 8 hours.  Blood gas, arterial     Status: Abnormal   Collection Time: 01/01/21  5:00 AM  Result Value Ref Range   FIO2 0.60    Delivery systems  VENTILATOR    Mode PRESSURE REGULATED VOLUME CONTROL    VT 500 mL   LHR 16 resp/min   Peep/cpap 5.0 cm H20   pH, Arterial 7.24 (L) 7.350 - 7.450   pCO2 arterial 50 (H) 32.0 - 48.0 mmHg   pO2, Arterial 190 (H) 83.0 - 108.0 mmHg   Bicarbonate 21.4 20.0 - 28.0 mmol/L   Acid-base deficit 6.3 (H) 0.0 - 2.0 mmol/L   O2 Saturation 99.5 %   Patient temperature 37.0    Collection site RIGHT RADIAL    Sample type ARTERIAL DRAW    Allens test (pass/fail) PASS PASS    Comment: Performed at [facility], [address] [place], [place] [address]  CBC     Status: Abnormal   Collection Time: 01/01/21  6:27 AM  Result Value Ref Range   WBC 11.8 (H) 4.0  - 10.5 K/uL   RBC 4.65 4.22 - 5.81 MIL/uL   Hemoglobin 14.5 13.0 - 17.0 g/dL   HCT 43.5 39.0 - 52.0 %   MCV 93.5 80.0 - 100.0 fL   MCH 31.2 26.0 - 34.0 pg   MCHC 33.3 30.0 - 36.0 g/dL   RDW 12.4 11.5 - 15.5 %   Platelets 155 150 - 400 K/uL   nRBC 0.0 0.0 - 0.2 %    Comment: Performed at [facility], [address], [place], [place] [address]  Basic metabolic panel     Status: Abnormal   Collection Time: 01/01/21  6:27 AM  Result Value Ref Range   Sodium 138 135 - 145 mmol/L   Potassium 3.5 3.5 - 5.1 mmol/L   Chloride 105 98 - 111 mmol/L   CO2 21 (L) 22 - 32 mmol/L   Glucose, Bld 75 70 - 99 mg/dL    Comment: Glucose reference range applies only to samples taken after fasting for at least 8 hours.   BUN 13 6 - 20 mg/dL   Creatinine, Ser 1.30 (H) 0.61 - 1.24 mg/dL   Calcium 8.3 (L) 8.9 - 10.3 mg/dL   GFR, Estimated >60 >60 mL/min    Comment: (NOTE) Calculated using the CKD-EPI Creatinine Equation (2021)    Anion gap 12 5 - 15    Comment: Performed at [facility], [address] [place], [place] [address]  Magnesium     Status: None   Collection Time: 01/01/21  6:27 AM  Result Value Ref Range   Magnesium 2.0 1.7 - 2.4 mg/dL    Comment: Performed at [facility], [address] [place], [place] [address]  Phosphorus     Status: None   Collection Time: 01/01/21  6:27 AM  Result Value Ref Range   Phosphorus 3.2 2.5 - 4.6 mg/dL    Comment: Performed at [facility], [address] [place], [place] [address]  Cortisol-am, blood     Status: Abnormal   Collection Time: 01/01/21  6:27 AM  Result Value Ref Range   Cortisol - AM 43.0 (H) 6.7 - 22.6 ug/dL    Comment: Performed at [facility] [address], [place], [place] [address]  Lipase, blood     Status: Abnormal   Collection Time: 01/01/21  6:27 AM  Result Value Ref Range   Lipase 52 (H) 11 - 51 U/L    Comment: Performed at [facility], [address]  [place], [place] [address]  Glucose, capillary     Status: None   Collection Time: 01/01/21  7:43  AM  Result Value Ref Range   Glucose-Capillary 78 70 - 99 mg/dL    Comment: Glucose reference range applies only to samples taken after fasting for at least 8 hours.  Blood gas, arterial     Status: Abnormal   Collection Time: 01/01/21  9:00 AM  Result Value Ref Range   FIO2 0.40    Delivery systems VENTILATOR    Mode PRESSURE REGULATED VOLUME CONTROL    VT 500 mL   LHR 20 resp/min   Peep/cpap 5.0 cm H20   pH, Arterial 7.39 7.350 - 7.450   pCO2 arterial 35 32.0 - 48.0 mmHg   pO2, Arterial 127 (H) 83.0 - 108.0 mmHg   Bicarbonate 21.2 20.0 - 28.0 mmol/L   Acid-base deficit 3.1 (H) 0.0 - 2.0 mmol/L   O2 Saturation 98.9 %   Patient temperature 37.0    Collection site RIGHT RADIAL    Sample type ARTERIAL DRAW    Allens test (pass/fail) POSITIVE (A) PASS    Comment: Performed at [facility], [address] [place], [place] [address]  Glucose, capillary     Status: None   Collection Time: 01/01/21 10:13 AM  Result Value Ref Range   Glucose-Capillary 93 70 - 99 mg/dL    Comment: Glucose reference range applies only to samples taken after fasting for at least 8 hours.  Glucose, capillary     Status: Abnormal   Collection Time: 01/01/21 12:02 PM  Result Value Ref Range   Glucose-Capillary 102 (H) 70 - 99 mg/dL    Comment: Glucose reference range applies only to samples taken after fasting for at least 8 hours.  Glucose, capillary     Status: Abnormal   Collection Time: 01/01/21  2:29 PM  Result Value Ref Range   Glucose-Capillary 61 (L) 70 - 99 mg/dL    Comment: Glucose reference range applies only to samples taken after fasting for at least 8 hours.  Glucose, capillary     Status: Abnormal   Collection Time: 01/01/21  3:10 PM  Result Value Ref Range   Glucose-Capillary 63 (L) 70 - 99 mg/dL    Comment: Glucose reference range applies only to samples taken after fasting for at  least 8 hours.  Glucose, capillary     Status: None   Collection Time: 01/01/21  4:13 PM  Result Value Ref Range   Glucose-Capillary 82 70 - 99 mg/dL    Comment: Glucose reference range applies only to samples taken after fasting for at least 8 hours.  Glucose, capillary     Status: Abnormal   Collection Time: 01/01/21  6:08 PM  Result Value Ref Range   Glucose-Capillary 44 (LL) 70 - 99 mg/dL    Comment: Glucose reference range applies only to samples taken after fasting for at least 8 hours.   Comment 1 Notify RN   Glucose, capillary     Status: Abnormal   Collection Time: 01/01/21  7:46 PM  Result Value Ref Range   Glucose-Capillary 63 (L) 70 - 99 mg/dL    Comment: Glucose reference range applies only to samples taken after fasting for at least 8 hours.  Glucose, capillary     Status: None   Collection Time: 01/01/21  8:43 PM  Result Value Ref Range   Glucose-Capillary 79 70 - 99 mg/dL    Comment: Glucose reference range applies only to samples taken after fasting for at least 8 hours.  Glucose, capillary     Status: Abnormal  Collection Time: 01/01/21  9:58 PM  Result Value Ref Range   Glucose-Capillary 38 (LL) 70 - 99 mg/dL    Comment: Glucose reference range applies only to samples taken after fasting for at least 8 hours.   Comment 1 Notify RN   Glucose, capillary     Status: Abnormal   Collection Time: 01/01/21 10:58 PM  Result Value Ref Range   Glucose-Capillary 142 (H) 70 - 99 mg/dL    Comment: Glucose reference range applies only to samples taken after fasting for at least 8 hours.  Glucose, capillary     Status: Abnormal   Collection Time: 01/02/21 12:11 AM  Result Value Ref Range   Glucose-Capillary 61 (L) 70 - 99 mg/dL    Comment: Glucose reference range applies only to samples taken after fasting for at least 8 hours.  Basic metabolic panel     Status: Abnormal   Collection Time: 01/02/21 12:43 AM  Result Value Ref Range   Sodium 137 135 - 145 mmol/L    Potassium 3.7 3.5 - 5.1 mmol/L   Chloride 105 98 - 111 mmol/L   CO2 26 22 - 32 mmol/L   Glucose, Bld 108 (H) 70 - 99 mg/dL    Comment: Glucose reference range applies only to samples taken after fasting for at least 8 hours.   BUN 16 6 - 20 mg/dL   Creatinine, Ser 1.75 (H) 0.61 - 1.24 mg/dL   Calcium 8.6 (L) 8.9 - 10.3 mg/dL   GFR, Estimated 45 (L) >60 mL/min    Comment: (NOTE) Calculated using the CKD-EPI Creatinine Equation (2021)    Anion gap 6 5 - 15    Comment: Performed at [facility], [address] [place], [place] [address]  Glucose, capillary     Status: Abnormal   Collection Time: 01/02/21 12:50 AM  Result Value Ref Range   Glucose-Capillary 104 (H) 70 - 99 mg/dL    Comment: Glucose reference range applies only to samples taken after fasting for at least 8 hours.  Glucose, capillary     Status: Abnormal   Collection Time: 01/02/21  1:39 AM  Result Value Ref Range   Glucose-Capillary 66 (L) 70 - 99 mg/dL    Comment: Glucose reference range applies only to samples taken after fasting for at least 8 hours.  Glucose, capillary     Status: Abnormal   Collection Time: 01/02/21  2:16 AM  Result Value Ref Range   Glucose-Capillary 119 (H) 70 - 99 mg/dL    Comment: Glucose reference range applies only to samples taken after fasting for at least 8 hours.  Glucose, capillary     Status: Abnormal   Collection Time: 01/02/21  3:50 AM  Result Value Ref Range   Glucose-Capillary 37 (LL) 70 - 99 mg/dL    Comment: Glucose reference range applies only to samples taken after fasting for at least 8 hours.   Comment 1 Notify RN   Glucose, capillary     Status: Abnormal   Collection Time: 01/02/21  4:17 AM  Result Value Ref Range   Glucose-Capillary 111 (H) 70 - 99 mg/dL    Comment: Glucose reference range applies only to samples taken after fasting for at least 8 hours.  Glucose, capillary     Status: Abnormal   Collection Time: 01/02/21  5:42 AM  Result Value Ref Range    Glucose-Capillary 31 (LL) 70 - 99 mg/dL    Comment: Glucose reference range applies only to samples taken  after fasting for at least 8 hours.   Comment 1 Notify RN   Hepatic function panel     Status: Abnormal   Collection Time: 01/02/21  6:01 AM  Result Value Ref Range   Total Protein 6.0 (L) 6.5 - 8.1 g/dL   Albumin 3.2 (L) 3.5 - 5.0 g/dL   AST 1,023 (H) 15 - 41 U/L   ALT 506 (H) 0 - 44 U/L   Alkaline Phosphatase 64 38 - 126 U/L   Total Bilirubin 0.9 0.3 - 1.2 mg/dL   Bilirubin, Direct 0.2 0.0 - 0.2 mg/dL   Indirect Bilirubin 0.7 0.3 - 0.9 mg/dL    Comment: Performed at [facility], [address] [place], [place] [address]  CBC     Status: Abnormal   Collection Time: 01/02/21  6:07 AM  Result Value Ref Range   WBC 10.6 (H) 4.0 - 10.5 K/uL   RBC 4.33 4.22 - 5.81 MIL/uL   Hemoglobin 13.4 13.0 - 17.0 g/dL   HCT 39.1 39.0 - 52.0 %   MCV 90.3 80.0 - 100.0 fL   MCH 30.9 26.0 - 34.0 pg   MCHC 34.3 30.0 - 36.0 g/dL   RDW 12.7 11.5 - 15.5 %   Platelets 142 (L) 150 - 400 K/uL   nRBC 0.0 0.0 - 0.2 %    Comment: Performed at [facility], [address], [place], [place] [address]  Basic metabolic panel     Status: Abnormal   Collection Time: 01/02/21  6:07 AM  Result Value Ref Range   Sodium 140 135 - 145 mmol/L   Potassium 3.7 3.5 - 5.1 mmol/L   Chloride 105 98 - 111 mmol/L   CO2 22 22 - 32 mmol/L   Glucose, Bld 110 (H) 70 - 99 mg/dL    Comment: Glucose reference range applies only to samples taken after fasting for at least 8 hours.   BUN 15 6 - 20 mg/dL   Creatinine, Ser 1.70 (H) 0.61 - 1.24 mg/dL   Calcium 8.6 (L) 8.9 - 10.3 mg/dL   GFR, Estimated 46 (L) >60 mL/min    Comment: (NOTE) Calculated using the CKD-EPI Creatinine Equation (2021)    Anion gap 13 5 - 15    Comment: Performed at [facility], [address] [place], [place] [address]  Magnesium     Status: None   Collection Time: 01/02/21  6:07 AM  Result Value Ref Range    Magnesium 1.8 1.7 - 2.4 mg/dL    Comment: Performed at [facility], [address] [place], [place] [address]  Phosphorus     Status: Abnormal   Collection Time: 01/02/21  6:07 AM  Result Value Ref Range   Phosphorus 2.0 (L) 2.5 - 4.6 mg/dL    Comment: Performed at [facility], [address] [place], [place] [address]  TSH     Status: None   Collection Time: 01/02/21  6:07 AM  Result Value Ref Range   TSH 0.728 0.350 - 4.500 uIU/mL    Comment: Performed by a 3rd Generation assay with a functional sensitivity of <=0.01 uIU/mL. Performed at [facility], [address] [place], [place] [address]   Glucose, capillary     Status: Abnormal   Collection Time: 01/02/21  6:14 AM  Result Value Ref Range   Glucose-Capillary 116 (H) 70 - 99 mg/dL    Comment: Glucose reference range applies only to samples taken after fasting for at least  8 hours.  Glucose, capillary     Status: None   Collection Time: 01/02/21  6:57 AM  Result Value Ref Range   Glucose-Capillary 85 70 - 99 mg/dL    Comment: Glucose reference range applies only to samples taken after fasting for at least 8 hours.  Glucose, capillary     Status: Abnormal   Collection Time: 01/02/21  7:51 AM  Result Value Ref Range   Glucose-Capillary 46 (L) 70 - 99 mg/dL    Comment: Glucose reference range applies only to samples taken after fasting for at least 8 hours.  Glucose, capillary     Status: Abnormal   Collection Time: 01/02/21  9:00 AM  Result Value Ref Range   Glucose-Capillary 114 (H) 70 - 99 mg/dL    Comment: Glucose reference range applies only to samples taken after fasting for at least 8 hours.   Comment 1 Notify RN   Glucose, capillary     Status: None   Collection Time: 01/02/21 10:00 AM  Result Value Ref Range   Glucose-Capillary 87 70 - 99 mg/dL    Comment: Glucose reference range applies only to samples taken after fasting for at least 8 hours.   Comment 1 Notify RN     Current  Facility-Administered Medications  Medication Dose Route Frequency Provider Last Rate Last Admin  . 0.9 %  sodium chloride infusion  250 mL Intravenous Continuous Hinda Kehr, MD      . acetaminophen (TYLENOL) suppository 650 mg  650 mg Rectal Q6H PRN Bradly Bienenstock, NP      . acetaminophen (TYLENOL) tablet 650 mg  650 mg Oral Q4H PRN Ottie Glazier, MD      . chlorhexidine gluconate (MEDLINE KIT) (PERIDEX) 0.12 % solution 15 mL  15 mL Mouth Rinse BID Awilda Bill, NP   15 mL at 01/02/21 0815  . Chlorhexidine Gluconate Cloth 2 % PADS 6 each  6 each Topical Daily Awilda Bill, NP   6 each at 01/02/21 1202  . dexmedetomidine (PRECEDEX) 400 MCG/100ML (4 mcg/mL) infusion  0.4-1.2 mcg/kg/hr Intravenous Titrated Awilda Bill, NP   Stopped at 01/01/21 1017  . dextrose 10 % infusion   Intravenous Continuous Darel Hong D, NP 150 mL/hr at 01/02/21 0600 Infusion Verify at 01/02/21 0600  . dextrose 50 % solution 12.5 g  12.5 g Intravenous STAT Aleskerov, Fuad, MD      . dextrose 50 % solution 25 g  25 g Intravenous PRN Darel Hong D, NP   25 g at 01/02/21 4580  . dextrose 50 % solution 50 mL  1 ampule Intravenous Once Bradly Bienenstock, NP      . Derrill Memo ON 01/03/2021] docusate (COLACE) 50 MG/5ML liquid 100 mg  100 mg Oral Daily Ottie Glazier, MD      . enoxaparin (LOVENOX) injection 40 mg  40 mg Subcutaneous Q24H Awilda Bill, NP   40 mg at 99/83/38 2505  . folic acid 1 mg in sodium chloride 0.9 % 50 mL IVPB  1 mg Intravenous Daily Awilda Bill, NP 100.4 mL/hr at 01/02/21 1138 1 mg at 01/02/21 1138  . hydrocortisone sodium succinate (SOLU-CORTEF) 100 MG injection 50 mg  50 mg Intravenous Q6H Awilda Bill, NP   50 mg at 01/02/21 1010  . levETIRAcetam (KEPPRA) IVPB 500 mg/100 mL premix  500 mg Intravenous Q12H Awilda Bill, NP 400 mL/hr at 01/02/21 1240 500 mg at 01/02/21 1240  . LORazepam (ATIVAN) injection 1-2  mg  1-2 mg Intravenous Q4H PRN Awilda Bill, NP      .  MEDLINE mouth rinse  15 mL Mouth Rinse 10 times per day Awilda Bill, NP   15 mL at 01/02/21 0349  . multivitamin with minerals tablet 1 tablet  1 tablet Oral Daily Aleskerov, Fuad, MD      . norepinephrine (LEVOPHED) 2m in 2566mpremix infusion  2-15 mcg/min Intravenous Titrated BlAwilda BillNP   Stopped at 01/02/21 0010  . ondansetron (ZOFRAN) injection 4 mg  4 mg Intravenous Q6H PRN BlAwilda BillNP      . pantoprazole (PROTONIX) injection 40 mg  40 mg Intravenous Q24H BlAwilda BillNP   40 mg at 01/02/21 1011  . polyethylene glycol (MIRALAX / GLYCOLAX) packet 17 g  17 g Per Tube Daily PRN AlOttie GlazierMD      . thiamine (B-1) injection 100 mg  100 mg Intravenous Daily BlAwilda BillNP   100 mg at 01/02/21 1011    Musculoskeletal: Strength & Muscle Tone: within normal limits Gait & Station: normal Patient leans: N/A  Psychiatric Specialty Exam: Physical Exam Vitals and nursing note reviewed.  Constitutional:      Appearance: He is well-developed and well-nourished.  HENT:     Head: Normocephalic and atraumatic.  Eyes:     Conjunctiva/sclera: Conjunctivae normal.     Pupils: Pupils are equal, round, and reactive to light.  Cardiovascular:     Heart sounds: Normal heart sounds.  Pulmonary:     Effort: Pulmonary effort is normal.  Abdominal:     Palpations: Abdomen is soft.  Musculoskeletal:        General: Normal range of motion.     Cervical back: Normal range of motion.  Skin:    General: Skin is warm and dry.  Neurological:     General: No focal deficit present.     Mental Status: He is alert.  Psychiatric:        Attention and Perception: Attention normal.        Mood and Affect: Affect is blunt.        Speech: Speech is delayed.        Behavior: Behavior is slowed.        Thought Content: Thought content is not paranoid. Thought content does not include homicidal or suicidal ideation.        Cognition and Memory: Cognition is impaired.         Judgment: Judgment normal.     Review of Systems  Constitutional: Negative.   HENT: Negative.   Eyes: Negative.   Respiratory: Negative.   Cardiovascular: Negative.   Gastrointestinal: Negative.   Musculoskeletal: Negative.   Skin: Negative.   Neurological: Negative.   Psychiatric/Behavioral: Negative.     Blood pressure (!) 155/82, pulse 73, temperature 98.2 F (36.8 C), temperature source Oral, resp. rate 17, height 6' 2"  (1.88 m), weight 96.8 kg, SpO2 100 %.Body mass index is 27.4 kg/m.  General Appearance: Casual  Eye Contact:  Good  Speech:  Clear and Coherent  Volume:  Normal  Mood:  Euthymic  Affect:  Congruent  Thought Process:  Goal Directed  Orientation:  Full (Time, Place, and Person)  Thought Content:  Logical  Suicidal Thoughts:  No  Homicidal Thoughts:  No  Memory:  Immediate;   Fair Recent;   Poor Remote;   Fair  Judgement:  Fair  Insight:  Fair  Psychomotor Activity:  Decreased  Concentration:  Concentration: Fair  Recall:  [employer] of Knowledge:  Fair  Language:  Fair  Akathisia:  No  Handed:  Right  AIMS (if indicated):     Assets:  Desire for Improvement Housing Resilience Social Support  ADL's:  Intact  Cognition:  WNL  Sleep:        Treatment Plan Summary: Plan 60 year old man with a history of alcohol abuse.  No evidence that he was intentionally trying to harm himself.  Currently denies symptoms of depression.  No evident psychosis.  Delirium seems to have resolved as he has sobered up.  Patient encouraged to be compliant with his seizure medicine even if he is drinking but obviously also to strongly considering stopping or cutting down on drinking.  He was reminded that there are plenty of mental health and substance abuse services available in [facility].  I have discontinued the IVC and patient can be discharged from the unit in the hospital without further psychiatric intervention.  Disposition: No evidence of imminent risk to  self or others at present.   Patient does not meet criteria for psychiatric inpatient admission. Supportive therapy provided about ongoing stressors.  Alethia Berthold, MD 01/02/2021 1:53 PM

## 2021-01-02 NOTE — Discharge Summary (Signed)
Physician Discharge Summary  Patient ID: Brandon Silva MRN: [id number] DOB/AGE: 05-07-1961 60 y.o.  Admit date: 01/01/2021 Discharge date: 01/02/2021  Admission Diagnoses:COCAINE POISONING RESP FAILURE  Discharge Diagnoses:  Principal Problem:   Acute delirium Active Problems:   ETOH abuse   Drug overdose   Discharged Condition:stable  Hospital Course:   60 year old man brought to the emergency room after being found unresponsive at home.  Patient at that time appeared to be intoxicated but multiple pill bottles were reportedly found nearby as well.  Patient was administered Narcan without response.  Required brief management in the ICU for failure to protect airway.  Now extubated and awake.  The patient says he has no memory of coming to the ER.  The last thing he remembers is that he was at home by himself drinking.  He declines to estimate how much alcohol he had consumed but says that overall he feels he is not drinking as much as he used to and that he has not felt like the problem has been worsening.  He denies that he has been using any other drugs.  Patient says from what he can recall his mood recently has been fine.  Denies any feelings of depression or hopelessness.  He denies having had any kind of suicidal thoughts at all that he is aware of and feels certain that he had not taken any pills and that if he had it would not of been with any suicidal intent.  Today he completely denies suicidal ideation.  He is not having any hallucinations.  He is alert and oriented.  Urine drug screen did not have benzodiazepines in it suggesting that he did not overdose on any sedatives.  He did have cocaine positive.  He denies to me remembering doing any cocaine.  Past Psychiatric History: Patient has a history of alcohol abuse which has caused emergency room visits and hospitalizations in the past.  No reported history of DTs.  He has a history of a seizure disorder part of which might be  related to alcohol abuse.  Patient denies ever having tried to kill himself in the past.  PATIENT DEMANDING TO GO HOME  Consults: PSYCH  Treatments: VENT SUPPORT   Discharge Exam: Blood pressure (!) 155/82, pulse 73, temperature 98.2 F (36.8 C), temperature source Oral, resp. rate 17, height 6\' 2"  (1.88 m), weight 96.8 kg, SpO2 100 %.  Physical Examination:   General Appearance: No distress  Neuro:without focal findings,  speech normal,  HEENT: PERRLA, EOM intact.   Pulmonary: normal breath sounds, No wheezing.  CardiovascularNormal S1,S2.  No m/r/g.   Abdomen: Benign, Soft, non-tender. Renal:  No costovertebral tenderness  GU:  Not performed at this time. Endoc: No evident thyromegaly Skin:   warm, no rashes, no ecchymosis  Extremities: normal, no cyanosis, clubbing. PSYCHIATRIC: Mood, affect within normal limits.   ALL OTHER ROS ARE NEGATIVE   Disposition: HOME   Allergies as of 01/02/2021      Reactions   Penicillins Rash   Has patient had a PCN reaction causing immediate rash, facial/tongue/throat swelling, SOB or lightheadedness with hypotension: Yes Has patient had a PCN reaction causing severe rash involving mucus membranes or skin necrosis: No Has patient had a PCN reaction that required hospitalization: No Has patient had a PCN reaction occurring within the last 10 years: No If all of the above answers are "NO", then may proceed with Cephalosporin use.      Medication List    STOP taking  these medications   acetaminophen 325 MG tablet Commonly known as: TYLENOL   allopurinol 100 MG tablet Commonly known as: ZYLOPRIM   citalopram 40 MG tablet Commonly known as: CELEXA   divalproex 500 MG DR tablet Commonly known as: Depakote   LORazepam 0.5 MG tablet Commonly known as: Ativan   meloxicam 15 MG tablet Commonly known as: MOBIC   NovoLIN 70/30 (70-30) 100 UNIT/ML injection Generic drug: insulin NPH-regular Human      USE PREVIOUS MEDS AT  HOME. FOLLOW UP WITH PCP IN 1 month  Signed: Arnetia Bronk 01/02/2021, 2:03 PM

## 2021-01-15 ENCOUNTER — Emergency Department: Payer: Medicaid Other

## 2021-01-15 ENCOUNTER — Inpatient Hospital Stay
Admission: EM | Admit: 2021-01-15 | Discharge: 2021-01-17 | DRG: 917 | Disposition: A | Discharge status: Home / Self Care | Payer: Medicaid Other | Attending: Internal Medicine | Admitting: Internal Medicine

## 2021-01-15 DIAGNOSIS — M6282 Rhabdomyolysis: Secondary | ICD-10-CM | POA: Diagnosis present

## 2021-01-15 DIAGNOSIS — E1141 Type 2 diabetes mellitus with diabetic mononeuropathy: Secondary | ICD-10-CM | POA: Diagnosis present

## 2021-01-15 DIAGNOSIS — I10 Essential (primary) hypertension: Secondary | ICD-10-CM | POA: Diagnosis present

## 2021-01-15 DIAGNOSIS — T796XXD Traumatic ischemia of muscle, subsequent encounter: Secondary | ICD-10-CM | POA: Diagnosis not present

## 2021-01-15 DIAGNOSIS — Z7289 Other problems related to lifestyle: Secondary | ICD-10-CM

## 2021-01-15 DIAGNOSIS — G9340 Encephalopathy, unspecified: Secondary | ICD-10-CM | POA: Diagnosis not present

## 2021-01-15 DIAGNOSIS — E872 Acidosis, unspecified: Secondary | ICD-10-CM

## 2021-01-15 DIAGNOSIS — T405X1A Poisoning by cocaine, accidental (unintentional), initial encounter: Principal | ICD-10-CM | POA: Diagnosis present

## 2021-01-15 DIAGNOSIS — T424X1A Poisoning by benzodiazepines, accidental (unintentional), initial encounter: Secondary | ICD-10-CM | POA: Diagnosis present

## 2021-01-15 DIAGNOSIS — T405X5A Adverse effect of cocaine, initial encounter: Secondary | ICD-10-CM | POA: Diagnosis present

## 2021-01-15 DIAGNOSIS — F17210 Nicotine dependence, cigarettes, uncomplicated: Secondary | ICD-10-CM | POA: Diagnosis present

## 2021-01-15 DIAGNOSIS — F1910 Other psychoactive substance abuse, uncomplicated: Secondary | ICD-10-CM | POA: Diagnosis not present

## 2021-01-15 DIAGNOSIS — R1110 Vomiting, unspecified: Secondary | ICD-10-CM

## 2021-01-15 DIAGNOSIS — F1010 Alcohol abuse, uncomplicated: Secondary | ICD-10-CM

## 2021-01-15 DIAGNOSIS — F10129 Alcohol abuse with intoxication, unspecified: Secondary | ICD-10-CM | POA: Diagnosis present

## 2021-01-15 DIAGNOSIS — R4182 Altered mental status, unspecified: Secondary | ICD-10-CM

## 2021-01-15 DIAGNOSIS — Z880 Allergy status to penicillin: Secondary | ICD-10-CM | POA: Diagnosis not present

## 2021-01-15 DIAGNOSIS — G40909 Epilepsy, unspecified, not intractable, without status epilepticus: Secondary | ICD-10-CM

## 2021-01-15 DIAGNOSIS — Y905 Blood alcohol level of 100-119 mg/100 ml: Secondary | ICD-10-CM | POA: Diagnosis present

## 2021-01-15 DIAGNOSIS — E11649 Type 2 diabetes mellitus with hypoglycemia without coma: Secondary | ICD-10-CM | POA: Diagnosis present

## 2021-01-15 DIAGNOSIS — F1410 Cocaine abuse, uncomplicated: Secondary | ICD-10-CM | POA: Diagnosis present

## 2021-01-15 DIAGNOSIS — Z20822 Contact with and (suspected) exposure to covid-19: Secondary | ICD-10-CM | POA: Diagnosis present

## 2021-01-15 DIAGNOSIS — Z833 Family history of diabetes mellitus: Secondary | ICD-10-CM

## 2021-01-15 DIAGNOSIS — T424X5A Adverse effect of benzodiazepines, initial encounter: Secondary | ICD-10-CM | POA: Diagnosis present

## 2021-01-15 DIAGNOSIS — Y929 Unspecified place or not applicable: Secondary | ICD-10-CM

## 2021-01-15 DIAGNOSIS — T68XXXA Hypothermia, initial encounter: Secondary | ICD-10-CM | POA: Diagnosis not present

## 2021-01-15 DIAGNOSIS — T796XXA Traumatic ischemia of muscle, initial encounter: Secondary | ICD-10-CM

## 2021-01-15 DIAGNOSIS — G928 Other toxic encephalopathy: Secondary | ICD-10-CM | POA: Diagnosis present

## 2021-01-15 DIAGNOSIS — F1090 Alcohol use, unspecified, uncomplicated: Secondary | ICD-10-CM

## 2021-01-15 DIAGNOSIS — Z91148 Patient's other noncompliance with medication regimen for other reason: Secondary | ICD-10-CM

## 2021-01-15 DIAGNOSIS — Z9114 Patient's other noncompliance with medication regimen: Secondary | ICD-10-CM

## 2021-01-15 DIAGNOSIS — Z789 Other specified health status: Secondary | ICD-10-CM

## 2021-01-15 LAB — CBC WITH DIFFERENTIAL/PLATELET
Abs Immature Granulocytes: 0.09 10*3/uL — ABNORMAL HIGH (ref 0.00–0.07)
Basophils Absolute: 0 10*3/uL (ref 0.0–0.1)
Basophils Relative: 0 %
Eosinophils Absolute: 0 10*3/uL (ref 0.0–0.5)
Eosinophils Relative: 0 %
HCT: 48.3 % (ref 39.0–52.0)
Hemoglobin: 16.7 g/dL (ref 13.0–17.0)
Immature Granulocytes: 1 %
Lymphocytes Relative: 8 %
Lymphs Abs: 1 10*3/uL (ref 0.7–4.0)
MCH: 31.2 pg (ref 26.0–34.0)
MCHC: 34.6 g/dL (ref 30.0–36.0)
MCV: 90.3 fL (ref 80.0–100.0)
Monocytes Absolute: 0.5 10*3/uL (ref 0.1–1.0)
Monocytes Relative: 4 %
Neutro Abs: 11 10*3/uL — ABNORMAL HIGH (ref 1.7–7.7)
Neutrophils Relative %: 87 %
Platelets: 279 10*3/uL (ref 150–400)
RBC: 5.35 MIL/uL (ref 4.22–5.81)
RDW: 12.4 % (ref 11.5–15.5)
WBC: 12.6 10*3/uL — ABNORMAL HIGH (ref 4.0–10.5)
nRBC: 0 % (ref 0.0–0.2)

## 2021-01-15 LAB — COMPREHENSIVE METABOLIC PANEL
ALT: 98 U/L — ABNORMAL HIGH (ref 0–44)
AST: 85 U/L — ABNORMAL HIGH (ref 15–41)
Albumin: 4.7 g/dL (ref 3.5–5.0)
Alkaline Phosphatase: 100 U/L (ref 38–126)
Anion gap: 22 — ABNORMAL HIGH (ref 5–15)
BUN: 15 mg/dL (ref 6–20)
CO2: 19 mmol/L — ABNORMAL LOW (ref 22–32)
Calcium: 9.3 mg/dL (ref 8.9–10.3)
Chloride: 95 mmol/L — ABNORMAL LOW (ref 98–111)
Creatinine, Ser: 1.01 mg/dL (ref 0.61–1.24)
GFR, Estimated: >60 mL/min (ref >60)
Glucose, Bld: 224 mg/dL — ABNORMAL HIGH (ref 70–99)
Potassium: 4.4 mmol/L (ref 3.5–5.1)
Sodium: 136 mmol/L (ref 135–145)
Total Bilirubin: 0.6 mg/dL (ref 0.3–1.2)
Total Protein: 8.6 g/dL — ABNORMAL HIGH (ref 6.5–8.1)

## 2021-01-15 LAB — TROPONIN I (HIGH SENSITIVITY)
Troponin I (High Sensitivity): 6 ng/L (ref <18)
Troponin I (High Sensitivity): 6 ng/L (ref <18)

## 2021-01-15 LAB — ACETAMINOPHEN LEVEL: Acetaminophen (Tylenol), Serum: <10 ug/mL — ABNORMAL LOW (ref 10–30)

## 2021-01-15 LAB — VALPROIC ACID LEVEL: Valproic Acid Lvl: <10 ug/mL — ABNORMAL LOW (ref 50.0–100.0)

## 2021-01-15 LAB — CBG MONITORING, ED: Glucose-Capillary: 232 mg/dL — ABNORMAL HIGH (ref 70–99)

## 2021-01-15 LAB — LACTIC ACID, PLASMA
Lactic Acid, Venous: 7.2 mmol/L (ref 0.5–1.9)
Lactic Acid, Venous: 7.5 mmol/L (ref 0.5–1.9)

## 2021-01-15 LAB — SALICYLATE LEVEL: Salicylate Lvl: <7 mg/dL — ABNORMAL LOW (ref 7.0–30.0)

## 2021-01-15 LAB — AMMONIA: Ammonia: 20 umol/L (ref 9–35)

## 2021-01-15 LAB — ETHANOL: Alcohol, Ethyl (B): 104 mg/dL — ABNORMAL HIGH (ref <10)

## 2021-01-15 LAB — BRAIN NATRIURETIC PEPTIDE: B Natriuretic Peptide: 31.9 pg/mL (ref 0.0–100.0)

## 2021-01-15 MED ORDER — SODIUM CHLORIDE 0.9 % IV SOLN
2.0000 g | Freq: Once | INTRAVENOUS | Status: AC
  Administered 2021-01-15: 2 g via INTRAVENOUS
  Filled 2021-01-15: qty 2

## 2021-01-15 MED ORDER — LABETALOL HCL 5 MG/ML IV SOLN
INTRAVENOUS | Status: AC
  Administered 2021-01-15: 5 mg
  Filled 2021-01-15: qty 4

## 2021-01-15 MED ORDER — SODIUM BICARBONATE 8.4 % IV SOLN
50.0000 meq | Freq: Once | INTRAVENOUS | Status: AC
  Administered 2021-01-16: 50 meq via INTRAVENOUS
  Filled 2021-01-15: qty 50

## 2021-01-15 MED ORDER — DOCUSATE SODIUM 100 MG PO CAPS: 100.0000 mg | ORAL_CAPSULE | Freq: Two times a day (BID) | ORAL | Status: DC | PRN

## 2021-01-15 MED ORDER — LACTATED RINGERS IV BOLUS (SEPSIS)
1000.0000 mL | Freq: Once | INTRAVENOUS | Status: AC
  Administered 2021-01-16: 1000 mL via INTRAVENOUS

## 2021-01-15 MED ORDER — SODIUM CHLORIDE 0.9 % IV BOLUS
1000.0000 mL | Freq: Once | INTRAVENOUS | Status: AC
  Administered 2021-01-15: 1000 mL via INTRAVENOUS

## 2021-01-15 MED ORDER — LACTATED RINGERS IV SOLN: INTRAVENOUS | Status: AC

## 2021-01-15 MED ORDER — SODIUM CHLORIDE 0.9 % IV SOLN: 1.0000 g | Freq: Once | INTRAVENOUS | Status: DC

## 2021-01-15 MED ORDER — ONDANSETRON HCL 4 MG/2ML IJ SOLN
4.0000 mg | Freq: Four times a day (QID) | INTRAMUSCULAR | Status: DC | PRN
  Administered 2021-01-16: 4 mg via INTRAVENOUS
  Filled 2021-01-15: qty 2

## 2021-01-15 MED ORDER — NALOXONE HCL 2 MG/2ML IJ SOSY
1.0000 mg | PREFILLED_SYRINGE | INTRAMUSCULAR | Status: AC
  Administered 2021-01-16: 1 mg via INTRAVENOUS
  Filled 2021-01-15: qty 2

## 2021-01-15 MED ORDER — POLYETHYLENE GLYCOL 3350 17 G PO PACK: 17.0000 g | PACK | Freq: Every day | ORAL | Status: DC | PRN

## 2021-01-15 MED ORDER — LACTATED RINGERS IV BOLUS (SEPSIS)
1000.0000 mL | Freq: Once | INTRAVENOUS | Status: AC
  Administered 2021-01-15: 1000 mL via INTRAVENOUS

## 2021-01-15 MED ORDER — VANCOMYCIN HCL IN DEXTROSE 1-5 GM/200ML-% IV SOLN
1000.0000 mg | Freq: Once | INTRAVENOUS | Status: AC
  Administered 2021-01-15: 1000 mg via INTRAVENOUS
  Filled 2021-01-15: qty 200

## 2021-01-15 MED ORDER — LEVETIRACETAM IN NACL 1000 MG/100ML IV SOLN
1000.0000 mg | INTRAVENOUS | Status: AC
  Administered 2021-01-16: 1000 mg via INTRAVENOUS
  Filled 2021-01-15: qty 100

## 2021-01-15 MED ORDER — ENOXAPARIN SODIUM 40 MG/0.4ML ~~LOC~~ SOLN
40.0000 mg | SUBCUTANEOUS | Status: DC
  Administered 2021-01-16: 40 mg via SUBCUTANEOUS
  Filled 2021-01-15: qty 0.4

## 2021-01-15 MED ORDER — METRONIDAZOLE IN NACL 5-0.79 MG/ML-% IV SOLN
500.0000 mg | Freq: Once | INTRAVENOUS | Status: AC
  Administered 2021-01-15: 500 mg via INTRAVENOUS
  Filled 2021-01-15: qty 100

## 2021-01-15 MED ORDER — LEVETIRACETAM IN NACL 500 MG/100ML IV SOLN
500.0000 mg | Freq: Two times a day (BID) | INTRAVENOUS | Status: DC
  Administered 2021-01-16 – 2021-01-17 (×3): 500 mg via INTRAVENOUS
  Filled 2021-01-15 (×5): qty 100

## 2021-01-15 NOTE — Sepsis Progress Note (Signed)
Notified provider of need to order repeat lactic acid. ° °

## 2021-01-15 NOTE — ED Provider Notes (Signed)
[facility] Emergency Department Provider Note   ____________________________________________   Event Date/Time   First MD Initiated Contact with Patient 01/15/21 1846     (approximate)  I have reviewed the triage vital signs and the nursing notes.   HISTORY  Chief Complaint Altered Mental Status    HPI Brandon Silva is a 60 y.o. male who comes in the hospital with EMS.  EMS reports he was found facedown unresponsive at home surrounded by a large number of empty beer bottles and pill bottles with illegible labels which were empty.  Patient is maintaining his airway and is unresponsive occasionally he will jerk his shoulder several times but it is easy to have him stop this much sure that this is a seizure.  He has mid position pupils which are reactive but disconjugate gaze.  His O2 sats are good in the high 90s and his blood pressure is also actually hypertensive.  We will get a Depakote level CBG and various other labs to try to determine what he took.         Past Medical History:  Diagnosis Date  . Alcohol abuse   . Diabetes mellitus without complication (HCC)   . Seizures [facility])     Patient Active Problem List   Diagnosis Date Noted  . Acute delirium 01/02/2021  . Drug overdose 01/01/2021  . Recurrent seizures (HCC) 07/20/2020  . Acute respiratory failure (HCC) 09/20/2016  . Seizures (HCC) 09/17/2016  . Ventilator dependence (HCC)   . Hypocalcemia   . AKI (acute kidney injury) (HCC)   . Acute encephalopathy   . ETOH abuse   . Uncontrolled type 2 diabetes mellitus with ketoacidosis without coma, without long-term current use of insulin (HCC)   . Seizure (HCC) 02/04/2016    No past surgical history on file.  Prior to Admission medications   Not on File    Allergies Penicillins  Family History  Problem Relation Age of Onset  . Diabetes Other     Social History Social History   Tobacco Use  . Smoking status: Current Every  Day Smoker    Types: Cigarettes  . Smokeless tobacco: Never Used  Substance Use Topics  . Alcohol use: Yes  . Drug use: No    Review of Systems Unable to obtain due to altered mental status  ____________________________________________   PHYSICAL EXAM:  VITAL SIGNS: ED Triage Vitals [01/15/21 1845]  Enc Vitals Group     BP (!) 174/97     Pulse Rate 93     Resp 20     Temp      Temp src      SpO2 97 %     Weight      Height      Head Circumference      Peak Flow      Pain Score      Pain Loc      Pain Edu?      Excl. in GC?     Constitutional: Unresponsive in no distress Eyes: Conjunctivae are normal.  Pupils are equal and reactive but gaze is disconjugate Head: Atraumatic. Nose: No congestion/rhinnorhea. Mouth/Throat: Mucous membranes are moist.  Oropharynx non-erythematous. Neck: No stridor. Cardiovascular: Normal rate, regular rhythm. Grossly normal heart sounds.  Good peripheral circulation. Respiratory: Normal respiratory effort.  No retractions. Lungs CTAB. Gastrointestinal: Soft and nontender. No distention. No abdominal bruits.  Musculoskeletal: No lower extremity tenderness nor edema.   Neurologic: Patient not moving and not responding  to stimulation he is maintaining his airway and breathing normally.  He is not snoring Skin:  Skin is warm, dry and intact.  Psychiatric: Mood and affect are normal. Speech and behavior are normal.  ____________________________________________   LABS (all labs ordered are listed, but only abnormal results are displayed)  Labs Reviewed  ACETAMINOPHEN LEVEL - Abnormal; Notable for the following components:      Result Value   Acetaminophen (Tylenol), Serum <10 (*)    All other components within normal limits  COMPREHENSIVE METABOLIC PANEL - Abnormal; Notable for the following components:   Chloride 95 (*)    CO2 19 (*)    Glucose, Bld 224 (*)    Total Protein 8.6 (*)    AST 85 (*)    ALT 98 (*)    Anion gap 22 (*)     All other components within normal limits  ETHANOL - Abnormal; Notable for the following components:   Alcohol, Ethyl (B) 104 (*)    All other components within normal limits  SALICYLATE LEVEL - Abnormal; Notable for the following components:   Salicylate Lvl <7.0 (*)    All other components within normal limits  LACTIC ACID, PLASMA - Abnormal; Notable for the following components:   Lactic Acid, Venous 7.5 (*)    All other components within normal limits  LACTIC ACID, PLASMA - Abnormal; Notable for the following components:   Lactic Acid, Venous 7.2 (*)    All other components within normal limits  CBC WITH DIFFERENTIAL/PLATELET - Abnormal; Notable for the following components:   WBC 12.6 (*)    Neutro Abs 11.0 (*)    Abs Immature Granulocytes 0.09 (*)    All other components within normal limits  BLOOD GAS, VENOUS - Abnormal; Notable for the following components:   Acid-base deficit 6.9 (*)    All other components within normal limits  CBG MONITORING, ED - Abnormal; Notable for the following components:   Glucose-Capillary 232 (*)    All other components within normal limits  CULTURE, BLOOD (SINGLE)  BRAIN NATRIURETIC PEPTIDE  AMMONIA  URINALYSIS, COMPLETE (UACMP) WITH MICROSCOPIC  URINE DRUG SCREEN, QUALITATIVE ([facility] ONLY)  VALPROIC ACID LEVEL  TSH  TROPONIN I (HIGH SENSITIVITY)  TROPONIN I (HIGH SENSITIVITY)   ____________________________________________  EKG  EKG read interpreted by me shows normal sinus rhythm rate of 97 normal axis no acute ST-T changes no QTC prolongation or QRS prolongation. ____________________________________________  RADIOLOGY Jill Poling, personally viewed and evaluated these images (plain radiographs) as part of my medical decision making, as well as reviewing the written report by the radiologist.  ED MD interpretation: Rest x-ray read by radiology reviewed by me is negative  Official radiology report(s): CT Head Wo  Contrast  Result Date: 01/15/2021 CLINICAL DATA:  Mental status change, unknown cause. Additional history provided: Patient found unresponsive, face down at home. EXAM: CT HEAD WITHOUT CONTRAST TECHNIQUE: Contiguous axial images were obtained from the base of the skull through the vertex without intravenous contrast. COMPARISON:  Prior head CT examinations 01/01/2021 and earlier. Brain MRI 09/20/2016. FINDINGS: Brain: Mild cerebral atrophy. There is no acute intracranial hemorrhage. No demarcated cortical infarct. No extra-axial fluid collection. No evidence of intracranial mass. No midline shift. Vascular: No hyperdense vessel. Skull: Normal. Negative for fracture or focal lesion. Sinuses/Orbits: Visualized orbits show no acute finding. 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 Mild scattered paranasal sinus mucosal thickening at the imaged levels. Small osteomas within the left frontal  and ethmoid sinuses. IMPRESSION: No evidence of acute intracranial abnormality. Stable mild cerebral atrophy. Mild paranasal sinus mucosal thickening at the imaged levels. 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 Electronically Signed   By: Jackey Loge DO   On: 01/15/2021 20:50   DG Chest Portable 1 View  Result Date: 01/15/2021 CLINICAL DATA:  Altered mental status EXAM: PORTABLE CHEST 1 VIEW COMPARISON:  01/01/2021 FINDINGS: The heart size and mediastinal contours are within normal limits. Both lungs are clear. The visualized skeletal structures are unremarkable. IMPRESSION: No active disease. Electronically Signed   By: Jasmine Pang M.D.   On: 01/15/2021 19:26    ____________________________________________   PROCEDURES  Procedure(s) performed (including Critical Care): Critical care time half an hour this includes evaluating the patient 3 times reviewing some of his old records and discussing him with the ICU  personnel.  Procedures   ____________________________________________   INITIAL IMPRESSION / ASSESSMENT AND PLAN / ED COURSE  ----------------------------------------- 10:49 PM on 01/15/2021 -----------------------------------------  Patient's electrolytes came back late.  They do not look bad.  His LFTs are somewhat elevated as would be expected with the alcoholism.  Patient's lactic acid however has gone up in spite of fluids.  Additionally patient is now running a low temperature of 92.  He has a high white count as well.  I am not sure what source he would have but I am going to get him in the hospital.  I have contacted ICU.  We will use some warm fluids for his sepsis protocol and get him on the bear hugger and give him antibiotics.              ____________________________________________   FINAL CLINICAL IMPRESSION(S) / ED DIAGNOSES  Final diagnoses:  Altered mental status, unspecified altered mental status type  Hypothermia, initial encounter   Additionally patient appears to be septic  ED Discharge Orders    None      *Please note:  Brandon Silva was evaluated in Emergency Department on 01/15/2021 for the symptoms described in the history of present illness. He was evaluated in the context of the global COVID-19 pandemic, which necessitated consideration that the patient might be at risk for infection with the SARS-CoV-2 virus that causes COVID-19. Institutional protocols and algorithms that pertain to the evaluation of patients at risk for COVID-19 are in a state of rapid change based on information released by regulatory bodies including the CDC and federal and state organizations. These policies and algorithms were followed during the patient's care in the ED.  Some ED evaluations and interventions may be delayed as a result of limited staffing during and the pandemic.*   Note:  This document was prepared using Dragon voice recognition software and may include  unintentional dictation errors.    Arnaldo Natal, MD 01/15/21 2250

## 2021-01-15 NOTE — ED Notes (Signed)
resuumed care from christine rn.  Iv meds infusing.  Bear hugger in place.  nsr on monitor.

## 2021-01-15 NOTE — ED Notes (Signed)
Attempted IV/blood draw x2 without success. Consult to IV put in; also reaching to resources in the ED

## 2021-01-15 NOTE — ED Notes (Signed)
Pt returned from CT °

## 2021-01-15 NOTE — H&P (Addendum)
NAME:  Brandon Silva, MRN:  [id number], DOB:  [birth year], LOS: 0 ADMISSION DATE:  01/15/2021, CONSULTATION DATE: 01/15/2021 REFERRING MD: Dr. Darnelle Catalan, CHIEF COMPLAINT: Altered Mental Status  Brief History:  60 yo male with hx of seizures admitted with hypothermia, lactic acidosis, and acute encephalopathy after being found surrounded by large quantities of beer bottles and pill bottles with illegible labels  History of Present Illness:  This is a 60 yo male who [facility] ER on 01/18 from home via EMS after being found face down and unresponsive surrounded by large quantities of empty beer bottles and pill bottles with illegible labels.  Pts mother states she thinks he has been depressed due to his girlfriend leaving him 3 weeks ago, but he has not verbalized suicidal thoughts. She also states he was in his usual state of health on 01/18.  Per ER notes upon arrival to the ER pt able to protect his airway, but remained unresponsive with occasional jerking movements of his shoulders (although low suspicion for seizure activity according to ER physician note).  It was also noted pts pupils were reactive but with disconjugate gaze.  ER vital signs were: temp 92.6 F rectally, sbp 160-170's, and O2 sats upper 90's on RA.  Lab results revealed glucose 224, CO2 19, anion gap 22, lactic acid 7.2, wbc 12.6, acetaminophen level <10, salicylate level <7.0, valproic acid <10, alcohol level 104, and vbg pH 7.26/pCO2 45/acid-base deficit 6.9/bicarb 20.2.  CXR negative and COVID-19/Influenza PCR results pending.  However, due to concern of sepsis pt received aggressive iv fluid resuscitation, vancomycin, metronidazole, and aztreonam.    Pt recently admitted 01/01/2021 to 01/02/2021 with similar presentation, but required mechanical intubation for airway protection.  Once delirium resolved pt evaluated by psychiatry at that time, and was deemed not an imminent risk to self or others.  He also denied symptoms of  depression or intentionally trying to harm himself at that time.  Past Medical History:  Seizures (hx of noncompliance with anticonvulsant medication  Type II Diabetes Mellitus  ETOH Abuse   Significant Hospital Events:  01/18: Pt admitted to the stepdown unit with acute encephalopathy, however remained in the ER pending bed availability   Consults:  Intensivist   Procedures:  None   Significant Diagnostic Tests:  CT Head 01/18>>No evidence of acute intracranial abnormality. Stable mild cerebral atrophy. Mild paranasal sinus mucosal thickening at the imaged levels. 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  Micro Data:  Urine 01/18>> Blood 01/18>> COVID-19/Influenza PCR 01/18>>  Antimicrobials:  Aztreonam 01/18>> Metronidazole 01/18>> Vancomycin x1 dose 01/18  Interim History / Subjective:  Pt somnolent responds to painful stimulation only, however protecting airway.   Objective   Blood pressure (!) 175/104, pulse 77, temperature (!) 92.6 F (33.7 C), temperature source Rectal, resp. rate 16, SpO2 98 %.        Intake/Output Summary (Last 24 hours) at 01/15/2021 2317 Last data filed at 01/15/2021 2207 Gross per 24 hour  Intake 1000 ml  Output --  Net 1000 ml   There were no vitals filed for this visit.  Examination: General: acutely ill appearing male, NAD resting in bed  HENT: supple, no JVD  Lungs: clear throughout, even, non labored  Cardiovascular: nsr, rrr, no R/G, 2+ radial and 2+ distal pulses, no edema  Abdomen: hypoactive BS x4, soft, taut, non distended Extremities: normal bulk and tone, no edema  Neuro: somnolent, not following commands, withdraws from painful stimulation only, PERRL  Assessment & Plan:   Acute toxic encephalopathy secondary to alcohol intoxication, suspected drug overdose and possible sepsis  Possible postictal state  Hx: Seizure activity current medication noncompliance (01/15/21:   valproic acid level <10) Frequent reorientation  IV narcan x1 dose  Will start iv keppra, thiamine, and folic acid  EEG pending  Once mentation improves will need psychiatry consult and sitter at bedside due to uncertainty if this was a suicide attempt  Urine drug screen results pending   HTN  Continuous telemetry monitoring  Will treat sbp >180   Anion gap metabolic acidosis  Severe lactic acidosis Continue iv fluid resuscitation  Trend lactic acid and VBG 2 amps of sodium bicarb   Possible sepsis although etiology unclear  Trend WBC and monitor fever curve  PCT pending Continue abx therapy for now as outlined above pending culture results   Hypothermia  Continue bear hugger to maintain normothermia   Type II diabetes mellitus  Hx: hypoglycemia  CBG's q4hrs    Best practice (evaluated daily)  Diet: NPO Pain/Anxiety/Delirium protocol (if indicated): not indicated  VAP protocol (if indicated): not indicated  DVT prophylaxis: subq lovenox  GI prophylaxis: not indicated  Glucose control: pt with hx of severe hypoglycemia will hold off on SSI until pt able to tolerate diet  Mobility: bedrest  Disposition: stepdown unit   Goals of Care:  Last date of multidisciplinary goals of care discussion: N/A Family and staff present: N/A Summary of discussion: N/A Follow up goals of care discussion due: 01/17/2021 Code Status: Full Code   Labs   CBC: Recent Labs  Lab 01/15/21 1859  WBC 12.6*  NEUTROABS 11.0*  HGB 16.7  HCT 48.3  MCV 90.3  PLT 279    Basic Metabolic Panel: Recent Labs  Lab 01/15/21 1859  NA 136  K 4.4  CL 95*  CO2 19*  GLUCOSE 224*  BUN 15  CREATININE 1.01  CALCIUM 9.3   GFR: Estimated Creatinine Clearance: 93.8 mL/min (by C-G formula based on SCr of 1.01 mg/dL). Recent Labs  Lab 01/15/21 1859 01/15/21 2105  WBC 12.6*  --   LATICACIDVEN 7.2* 7.5*    Liver Function Tests: Recent Labs  Lab 01/15/21 1859  AST 85*  ALT 98*   ALKPHOS 100  BILITOT 0.6  PROT 8.6*  ALBUMIN 4.7   No results for input(s): LIPASE, AMYLASE in the last 168 hours. Recent Labs  Lab 01/15/21 2105  AMMONIA 20    ABG    Component Value Date/Time   PHART 7.39 01/01/2021 0900   PCO2ART 35 01/01/2021 0900   PO2ART 127 (H) 01/01/2021 0900   HCO3 20.2 01/15/2021 1848   TCO2 23 09/17/2016 0606   ACIDBASEDEF 6.9 (H) 01/15/2021 1848   O2SAT 54.8 01/15/2021 1848     Coagulation Profile: No results for input(s): INR, PROTIME in the last 168 hours.  Cardiac Enzymes: No results for input(s): CKTOTAL, CKMB, CKMBINDEX, TROPONINI in the last 168 hours.  HbA1C: No results found for: HGBA1C  CBG: Recent Labs  Lab 01/15/21 1928  GLUCAP 232*    Review of Systems:   Unable to assess pt somnolent and unable to answer questions at this time.   Past Medical History:  He,  has a past medical history of Alcohol abuse, Diabetes mellitus without complication (HCC), and Seizures (HCC).   Surgical History:  No past surgical history on file.   Social History:   reports that he has been smoking cigarettes. He has never used smokeless tobacco. He  reports current alcohol use. He reports that he does not use drugs.   Family History:  His family history includes Diabetes in an other family member.   Allergies Allergies  Allergen Reactions  . Penicillins Rash    Has patient had a PCN reaction causing immediate rash, facial/tongue/throat swelling, SOB or lightheadedness with hypotension: Yes Has patient had a PCN reaction causing severe rash involving mucus membranes or skin necrosis: No Has patient had a PCN reaction that required hospitalization: No Has patient had a PCN reaction occurring within the last 10 years: No If all of the above answers are "NO", then may proceed with Cephalosporin use.      Home Medications  Prior to Admission medications   Not on File     -Updated pts mother Atilla Zollner via telephone regarding pts  condition and current plan of care.  All questions were answered will continue to monitor and assess pt   Sonda Rumble, [facility]  Pulmonary/Critical Care Pager [phone] (please enter 7 digits) PCCM Consult Pager [phone] (please enter 7 digits)

## 2021-01-15 NOTE — ED Notes (Signed)
Pt's sheets and clothing removing; new sheets placed and bear hugger applied. Will continue to monitor. Pt did open his eyes while I was working with him

## 2021-01-15 NOTE — Sepsis Progress Note (Signed)
Monitoring for code sepsis. 

## 2021-01-15 NOTE — ED Triage Notes (Signed)
Per EMS pt found unresponsive, face down in home; empty pill bottles of unknown medications, empty beer bottles aplenty

## 2021-01-15 NOTE — Progress Notes (Signed)
CODE SEPSIS - PHARMACY COMMUNICATION  **Broad Spectrum Antibiotics should be administered within 1 hour of Sepsis diagnosis**  Time Code Sepsis Called/Page Received: 2247  Antibiotics Ordered: Aztreonam, Vancomycin  Time of 1st antibiotic administration: 2308  Additional action taken by pharmacy: n/a  If necessary, Name of Provider/Nurse Contacted: n/a   Wayland Denis ,PharmD Clinical Pharmacist  01/15/2021  11:30 PM

## 2021-01-15 NOTE — ED Notes (Signed)
Patient transported to CT 

## 2021-01-15 NOTE — H&P (Incomplete)
NAME:  Brandon Silva, MRN:  [id number], DOB:  29-Oct-1961, LOS: 0 ADMISSION DATE:  01/15/2021, CONSULTATION DATE: 01/15/2021 REFERRING MD: Dr. Darnelle Catalan, CHIEF COMPLAINT: Altered Mental Status  Brief History:  60 yo male with hx of seizures admitted with hypothermia, lactic acidosis, and acute encephalopathy after being found surrounded by large quantities of beer bottles and pill bottles with illegible labels  History of Present Illness:  This is a 60 yo male who presented to [facility] ER on 01/18 from home via EMS after being found face down and unresponsive surrounded by large quantities of empty beer bottles and pill bottles with illegible labels.  Pts mother states the pt has been depressed due to his girlfriend leaving him 3 weeks ago.  Per ER notes upon arrival to the ER pt able to protect his airway but remained unresponsive with occasional jerking movements of his shoulders (although low suspicion for seizure activity according to ER physician note).  It was also noted pts pupils were reactive but with disconjugate gaze.  ER vital signs were: temp 92.6 F rectally, sbp 160-170's, and O2 sats upper 90's on RA.  Lab results revealed glucose 224, CO2 19, anion gap 22, lactic acid 7.2, wbc 12.6, acetaminophen level <10, salicylate level <7.0, valproic acid <10, alcohol level 104, and vbg pH 7.26/pCO2 45/acid-base deficit 6.9/bicarb 20.2.  CXR negative and COVID-19/Influenza PCR results pending.  However, due to concern of sepsis pt received aggressive iv fluid resuscitation, vancomycin, metronidazole, and aztreonam.    Pt recently admitted on 01/01/2021 to 01/02/2021 with similar presentation, but required mechanical intubation for airway protection.  Once delirium resolved pt evaluated by psychiatry at that time and was deemed not an imminent risk to self or others.  He also denied symptoms of depression or intentionally trying to harm himself at that time.  Past Medical History:  Seizures (hx of  noncompliance with anticonvulsant medication  Type II Diabetes Mellitus  ETOH Abuse    Significant Hospital Events:  01/18: Pt admitted to the stepdown unit with acute encephalopathy, however remained in the ER pending bed availability   Consults:  Intensivist   Procedures:  None   Significant Diagnostic Tests:  CT Head 01/18>>No evidence of acute intracranial abnormality. Stable mild cerebral atrophy. Mild paranasal sinus mucosal thickening at the imaged levels. 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  Micro Data:  Urine 01/18>> Blood 01/18>> COVID-19/Influenza PCR 01/18>>  Antimicrobials:  Aztreonam 01/18>> Metronidazole 01/18>> Vancomycin x1 dose 01/18  Interim History / Subjective:  Pt somnolent responds to painful stimulation only, however protecting airway   Objective   Blood pressure (!) 175/104, pulse 77, temperature (!) 92.6 F (33.7 C), temperature source Rectal, resp. rate 16, SpO2 98 %.        Intake/Output Summary (Last 24 hours) at 01/15/2021 2317 Last data filed at 01/15/2021 2207 Gross per 24 hour  Intake 1000 ml  Output -  Net 1000 ml   There were no vitals filed for this visit.  Examination: General: acutely ill appearing male, NAD resting in bed  HENT: supple, no JVD  Lungs: clear throughout, even, non labored  Cardiovascular: nsr, rrr, no R/G, 2+ radial and 2+ distal pulses, no edema  Abdomen: hypoactive BS x4, soft, taut, non distended Extremities: normal bulk and tone, no edema  Neuro: somnolent, not following commands, withdraws from painful stimulation only, PERRL   Assessment & Plan:    HTN  Continuous telemetry monitoring  Prn hydralazine for sbp <180  Possible sepsis although etiology unclear  Trend WBC and monitor fever curve  PCT pending Continue abx therapy for now as outlined above pending culture results     Acute toxic encephalopathy secondary to alcohol  intoxication, suspected drug overdose and possible sepsis  Possible postictal state  Hx: Seizure activity current medication noncompliance (01/18: valproic acid level <10) Frequent reorientation  IV narcan x1 dose  Will start iv keppra, thiamine, and folic acid  EEG pending  Once mentation improves will need psychiatry consult and sitter at bedside due to uncertainty if this was a suicide attempt  Urine drug screen results pending     Best practice (evaluated daily)  Diet: *** Pain/Anxiety/Delirium protocol (if indicated): *** VAP protocol (if indicated): *** DVT prophylaxis: *** GI prophylaxis: *** Glucose control: *** Mobility: *** Disposition:***  Goals of Care:  Last date of multidisciplinary goals of care discussion:*** Family and staff present: *** Summary of discussion: *** Follow up goals of care discussion due: *** Code Status: ***  Labs   CBC: Recent Labs  Lab 01/15/21 1859  WBC 12.6*  NEUTROABS 11.0*  HGB 16.7  HCT 48.3  MCV 90.3  PLT 279    Basic Metabolic Panel: Recent Labs  Lab 01/15/21 1859  NA 136  K 4.4  CL 95*  CO2 19*  GLUCOSE 224*  BUN 15  CREATININE 1.01  CALCIUM 9.3   GFR: Estimated Creatinine Clearance: 93.8 mL/min (by C-G formula based on SCr of 1.01 mg/dL). Recent Labs  Lab 01/15/21 1859 01/15/21 2105  WBC 12.6*  --   LATICACIDVEN 7.2* 7.5*    Liver Function Tests: Recent Labs  Lab 01/15/21 1859  AST 85*  ALT 98*  ALKPHOS 100  BILITOT 0.6  PROT 8.6*  ALBUMIN 4.7   No results for input(s): LIPASE, AMYLASE in the last 168 hours. Recent Labs  Lab 01/15/21 2105  AMMONIA 20    ABG    Component Value Date/Time   PHART 7.39 01/01/2021 0900   PCO2ART 35 01/01/2021 0900   PO2ART 127 (H) 01/01/2021 0900   HCO3 20.2 01/15/2021 1848   TCO2 23 09/17/2016 0606   ACIDBASEDEF 6.9 (H) 01/15/2021 1848   O2SAT 54.8 01/15/2021 1848     Coagulation Profile: No results for input(s): INR, PROTIME in the last 168  hours.  Cardiac Enzymes: No results for input(s): CKTOTAL, CKMB, CKMBINDEX, TROPONINI in the last 168 hours.  HbA1C: No results found for: HGBA1C  CBG: Recent Labs  Lab 01/15/21 1928  GLUCAP 232*    Review of Systems:   ***  Past Medical History:  He,  has a past medical history of Alcohol abuse, Diabetes mellitus without complication (HCC), and Seizures (HCC).   Surgical History:  No past surgical history on file.   Social History:   reports that he has been smoking cigarettes. He has never used smokeless tobacco. He reports current alcohol use. He reports that he does not use drugs.   Family History:  His family history includes Diabetes in an other family member.   Allergies Allergies  Allergen Reactions  . Penicillins Rash    Has patient had a PCN reaction causing immediate rash, facial/tongue/throat swelling, SOB or lightheadedness with hypotension: Yes Has patient had a PCN reaction causing severe rash involving mucus membranes or skin necrosis: No Has patient had a PCN reaction that required hospitalization: No Has patient had a PCN reaction occurring within the last 10 years: No If all of the above answers are "NO", then may  proceed with Cephalosporin use.      Home Medications  Prior to Admission medications   Not on File     Critical care time: ***

## 2021-01-16 ENCOUNTER — Inpatient Hospital Stay: Payer: Medicaid Other

## 2021-01-16 DIAGNOSIS — E872 Acidosis, unspecified: Secondary | ICD-10-CM

## 2021-01-16 DIAGNOSIS — G9340 Encephalopathy, unspecified: Secondary | ICD-10-CM

## 2021-01-16 DIAGNOSIS — R4182 Altered mental status, unspecified: Secondary | ICD-10-CM

## 2021-01-16 DIAGNOSIS — Z9114 Patient's other noncompliance with medication regimen: Secondary | ICD-10-CM

## 2021-01-16 DIAGNOSIS — T796XXA Traumatic ischemia of muscle, initial encounter: Secondary | ICD-10-CM

## 2021-01-16 DIAGNOSIS — T68XXXA Hypothermia, initial encounter: Secondary | ICD-10-CM

## 2021-01-16 DIAGNOSIS — T796XXD Traumatic ischemia of muscle, subsequent encounter: Secondary | ICD-10-CM

## 2021-01-16 DIAGNOSIS — F1910 Other psychoactive substance abuse, uncomplicated: Secondary | ICD-10-CM

## 2021-01-16 LAB — CK
Total CK: 11149 U/L — ABNORMAL HIGH (ref 49–397)
Total CK: 12705 U/L — ABNORMAL HIGH (ref 49–397)

## 2021-01-16 LAB — CBC
HCT: 45.4 % (ref 39.0–52.0)
HCT: 46.2 % (ref 39.0–52.0)
Hemoglobin: 15.5 g/dL (ref 13.0–17.0)
Hemoglobin: 15.9 g/dL (ref 13.0–17.0)
MCH: 30.6 pg (ref 26.0–34.0)
MCH: 30.6 pg (ref 26.0–34.0)
MCHC: 34.1 g/dL (ref 30.0–36.0)
MCHC: 34.4 g/dL (ref 30.0–36.0)
MCV: 89.7 fL (ref 80.0–100.0)
MCV: 89 fL (ref 80.0–100.0)
Platelets: 270 10*3/uL (ref 150–400)
Platelets: 271 10*3/uL (ref 150–400)
RBC: 5.06 MIL/uL (ref 4.22–5.81)
RBC: 5.19 MIL/uL (ref 4.22–5.81)
RDW: 12.5 % (ref 11.5–15.5)
RDW: 12.4 % (ref 11.5–15.5)
WBC: 14.9 10*3/uL — ABNORMAL HIGH (ref 4.0–10.5)
WBC: 12.5 10*3/uL — ABNORMAL HIGH (ref 4.0–10.5)
nRBC: 0 % (ref 0.0–0.2)
nRBC: 0 % (ref 0.0–0.2)

## 2021-01-16 LAB — BASIC METABOLIC PANEL
Anion gap: 14 (ref 5–15)
Anion gap: 14 (ref 5–15)
BUN: 13 mg/dL (ref 6–20)
BUN: 12 mg/dL (ref 6–20)
CO2: 24 mmol/L (ref 22–32)
CO2: 26 mmol/L (ref 22–32)
Calcium: 8.6 mg/dL — ABNORMAL LOW (ref 8.9–10.3)
Calcium: 8.4 mg/dL — ABNORMAL LOW (ref 8.9–10.3)
Chloride: 99 mmol/L (ref 98–111)
Chloride: 100 mmol/L (ref 98–111)
Creatinine, Ser: 1.02 mg/dL (ref 0.61–1.24)
Creatinine, Ser: 1.13 mg/dL (ref 0.61–1.24)
GFR, Estimated: >60 mL/min (ref >60)
GFR, Estimated: >60 mL/min (ref >60)
Glucose, Bld: 187 mg/dL — ABNORMAL HIGH (ref 70–99)
Glucose, Bld: 195 mg/dL — ABNORMAL HIGH (ref 70–99)
Potassium: 4.7 mmol/L (ref 3.5–5.1)
Potassium: 4.5 mmol/L (ref 3.5–5.1)
Sodium: 137 mmol/L (ref 135–145)
Sodium: 140 mmol/L (ref 135–145)

## 2021-01-16 LAB — HEPATIC FUNCTION PANEL
ALT: 92 U/L — ABNORMAL HIGH (ref 0–44)
AST: 137 U/L — ABNORMAL HIGH (ref 15–41)
Albumin: 3.8 g/dL (ref 3.5–5.0)
Alkaline Phosphatase: 89 U/L (ref 38–126)
Bilirubin, Direct: 0.1 mg/dL (ref 0.0–0.2)
Indirect Bilirubin: 0.7 mg/dL (ref 0.3–0.9)
Total Bilirubin: 0.8 mg/dL (ref 0.3–1.2)
Total Protein: 7.1 g/dL (ref 6.5–8.1)

## 2021-01-16 LAB — URINALYSIS, COMPLETE (UACMP) WITH MICROSCOPIC
Bacteria, UA: NONE SEEN
Bilirubin Urine: NEGATIVE
Glucose, UA: >=500 mg/dL — AB
Ketones, ur: 20 mg/dL — AB
Leukocytes,Ua: NEGATIVE
Nitrite: NEGATIVE
Protein, ur: 100 mg/dL — AB
Specific Gravity, Urine: 1.025 (ref 1.005–1.030)
pH: 5 (ref 5.0–8.0)

## 2021-01-16 LAB — CREATININE, SERUM
Creatinine, Ser: 0.88 mg/dL (ref 0.61–1.24)
GFR, Estimated: >60 mL/min (ref >60)

## 2021-01-16 LAB — BLOOD GAS, VENOUS
Acid-Base Excess: 2.6 mmol/L — ABNORMAL HIGH (ref 0.0–2.0)
Bicarbonate: 26.3 mmol/L (ref 20.0–28.0)
O2 Saturation: 52.6 %
Patient temperature: 37
pCO2, Ven: 37 mmHg — ABNORMAL LOW (ref 44.0–60.0)
pH, Ven: 7.46 — ABNORMAL HIGH (ref 7.250–7.430)
pO2, Ven: <31 mmHg — CL (ref 32.0–45.0)

## 2021-01-16 LAB — URINE DRUG SCREEN, QUALITATIVE (ARMC ONLY)
Amphetamines, Ur Screen: NOT DETECTED
Barbiturates, Ur Screen: NOT DETECTED
Benzodiazepine, Ur Scrn: NOT DETECTED
Cannabinoid 50 Ng, Ur ~~LOC~~: NOT DETECTED
Cocaine Metabolite,Ur ~~LOC~~: POSITIVE — AB
MDMA (Ecstasy)Ur Screen: NOT DETECTED
Methadone Scn, Ur: NOT DETECTED
Opiate, Ur Screen: NOT DETECTED
Phencyclidine (PCP) Ur S: NOT DETECTED
Tricyclic, Ur Screen: POSITIVE — AB

## 2021-01-16 LAB — LACTIC ACID, PLASMA
Lactic Acid, Venous: 6.8 mmol/L (ref 0.5–1.9)
Lactic Acid, Venous: 4 mmol/L (ref 0.5–1.9)
Lactic Acid, Venous: 2.9 mmol/L (ref 0.5–1.9)
Lactic Acid, Venous: 3.6 mmol/L (ref 0.5–1.9)
Lactic Acid, Venous: 3.1 mmol/L (ref 0.5–1.9)

## 2021-01-16 LAB — CBG MONITORING, ED
Glucose-Capillary: 156 mg/dL — ABNORMAL HIGH (ref 70–99)
Glucose-Capillary: 215 mg/dL — ABNORMAL HIGH (ref 70–99)

## 2021-01-16 LAB — PROCALCITONIN: Procalcitonin: 0.35 ng/mL

## 2021-01-16 LAB — TSH: TSH: 0.521 u[IU]/mL (ref 0.350–4.500)

## 2021-01-16 LAB — PHOSPHORUS: Phosphorus: 3.2 mg/dL (ref 2.5–4.6)

## 2021-01-16 LAB — GLUCOSE, CAPILLARY
Glucose-Capillary: 187 mg/dL — ABNORMAL HIGH (ref 70–99)
Glucose-Capillary: 179 mg/dL — ABNORMAL HIGH (ref 70–99)

## 2021-01-16 LAB — RESP PANEL BY RT-PCR (FLU A&B, COVID) ARPGX2
Influenza A by PCR: NEGATIVE
Influenza B by PCR: NEGATIVE
SARS Coronavirus 2 by RT PCR: NEGATIVE

## 2021-01-16 LAB — MRSA PCR SCREENING: MRSA by PCR: NEGATIVE

## 2021-01-16 LAB — MAGNESIUM: Magnesium: 1.6 mg/dL — ABNORMAL LOW (ref 1.7–2.4)

## 2021-01-16 MED ORDER — LEVOFLOXACIN IN D5W 750 MG/150ML IV SOLN
750.0000 mg | INTRAVENOUS | Status: DC
  Administered 2021-01-16: 750 mg via INTRAVENOUS
  Filled 2021-01-16 (×2): qty 150

## 2021-01-16 MED ORDER — LACTATED RINGERS IV SOLN: INTRAVENOUS | Status: DC

## 2021-01-16 MED ORDER — THIAMINE HCL 100 MG/ML IJ SOLN
100.0000 mg | INTRAMUSCULAR | Status: DC
  Administered 2021-01-17: 100 mg via INTRAVENOUS
  Filled 2021-01-16: qty 2

## 2021-01-16 MED ORDER — CHLORHEXIDINE GLUCONATE CLOTH 2 % EX PADS
6.0000 | MEDICATED_PAD | Freq: Every day | CUTANEOUS | Status: DC
  Administered 2021-01-16: 6 via TOPICAL

## 2021-01-16 MED ORDER — HYDRALAZINE HCL 20 MG/ML IJ SOLN
10.0000 mg | INTRAMUSCULAR | Status: DC | PRN
  Administered 2021-01-16: 20 mg via INTRAVENOUS
  Filled 2021-01-16: qty 1

## 2021-01-16 MED ORDER — LACTATED RINGERS IV BOLUS
1000.0000 mL | Freq: Once | INTRAVENOUS | Status: AC
  Administered 2021-01-16: 1000 mL via INTRAVENOUS

## 2021-01-16 MED ORDER — SODIUM CHLORIDE 0.9 % IV SOLN: 1.0000 mg | Freq: Once | INTRAVENOUS | Status: DC

## 2021-01-16 MED ORDER — SODIUM CHLORIDE 0.9 % IV SOLN
2.0000 g | Freq: Three times a day (TID) | INTRAVENOUS | Status: DC
  Administered 2021-01-16: 2 g via INTRAVENOUS
  Filled 2021-01-16 (×4): qty 2

## 2021-01-16 MED ORDER — FOLIC ACID 5 MG/ML IJ SOLN
1.0000 mg | Freq: Every day | INTRAMUSCULAR | Status: DC
  Administered 2021-01-16 – 2021-01-17 (×3): 1 mg via INTRAVENOUS
  Filled 2021-01-16 (×3): qty 0.2

## 2021-01-16 MED ORDER — MAGNESIUM SULFATE 4 GM/100ML IV SOLN
4.0000 g | Freq: Once | INTRAVENOUS | Status: AC
  Administered 2021-01-16: 4 g via INTRAVENOUS
  Filled 2021-01-16: qty 100

## 2021-01-16 MED ORDER — FOLIC ACID 5 MG/ML IJ SOLN
1.0000 mg | Freq: Once | INTRAMUSCULAR | Status: DC
  Administered 2021-01-16: 1 mg via INTRAVENOUS
  Filled 2021-01-16: qty 0.2

## 2021-01-16 MED ORDER — INSULIN ASPART 100 UNIT/ML ~~LOC~~ SOLN
0.0000 [IU] | Freq: Three times a day (TID) | SUBCUTANEOUS | Status: DC
  Administered 2021-01-17: 2 [IU] via SUBCUTANEOUS
  Filled 2021-01-16 (×2): qty 1

## 2021-01-16 MED ORDER — METRONIDAZOLE IN NACL 5-0.79 MG/ML-% IV SOLN
500.0000 mg | Freq: Three times a day (TID) | INTRAVENOUS | Status: DC
  Administered 2021-01-16 – 2021-01-17 (×4): 500 mg via INTRAVENOUS
  Filled 2021-01-16 (×8): qty 100

## 2021-01-16 NOTE — Progress Notes (Signed)
eeg done °

## 2021-01-16 NOTE — ED Notes (Signed)
Annabelle Harman NP here to eval

## 2021-01-16 NOTE — ED Notes (Signed)
Attempted to call report. Per unit, pt room will be [id number], they are getting room ready and will call back this RN [phone]) shortly for report

## 2021-01-16 NOTE — ED Notes (Signed)
Pt vomited x1.  

## 2021-01-16 NOTE — ED Notes (Signed)
Pt responsive, able to answer questions. Is lethargic, returns to sleep shortly after being disturbed. Denies pain or further needs

## 2021-01-16 NOTE — Procedures (Signed)
Patient Name: Brandon Silva  MRN: [id number]  Epilepsy Attending: Charlsie Quest  Referring Physician/Provider: Sonda Rumble, NP Date: 01/17/2021 Duration: 21.46 mins  Patient history: 60 yo male with hx of seizures admitted with hypothermia, lactic acidosis, and acute encephalopathy after being found surrounded by large quantities of beer bottles and pill bottles with illegible labels. EEG to evaluate for seizure.  Level of alertness: Awake  AEDs during EEG study: LEV  Technical aspects: This EEG study was done with scalp electrodes positioned according to the 10-20 International system of electrode placement. Electrical activity was acquired at a sampling rate of 500Hz  and reviewed with a high frequency filter of 70Hz  and a low frequency filter of 1Hz . EEG data were recorded continuously and digitally stored.   Description:  EEG showed continuous generalized 3 to 6 Hz theta-delta slowing. Hyperventilation and photic stimulation were not performed.     Of note, parts of eeg were difficult to interpret due to significant movement artifact.  ABNORMALITY -Continuous slow, generalized  IMPRESSION: This technically difficult study is suggestive of moderate to severe diffuse encephalopathy, nonspecific etiology. No seizures or epileptiform discharges were seen throughout the recording.   Dior Dominik 

## 2021-01-16 NOTE — Progress Notes (Addendum)
CRITICAL CARE NOTE f up    hypothermia, lactic acidosis, and acute encephalopathy after being found surrounded by large quantities of beer bottles and pill bottles with illegible labels   SUBJECTIVE arousable denies any pain. No chest pain/headaches/abd pain. No n/v/d/abd pain No witnessed seizures Drowsy but arousable follows simple commands, doesn't move RUE    SIGNIFICANT EVENTS    BP (!) 160/110   Pulse 87   Temp 98.96 F (37.2 C) (Bladder)   Resp 16   Ht 6\' 1"  (1.854 m)   Wt 96.4 kg   SpO2 98%   BMI 28.04 kg/m    REVIEW OF SYSTEMS  PATIENT IS UNABLE TO PROVIDE COMPLETE REVIEW OF SYSTEMS DUE TO SEVERE CRITICAL ILLNESS   PHYSICAL EXAMINATION:  GENERAL:critically ill appearing, no resp distress HEAD: Normocephalic, atraumatic.  EYES: Pupils equal, round, reactive to light.  No scleral icterus.  MOUTH: Moist mucosal membrane. NECK: Supple. No thyromegaly. No nodules. No JVD.  PULMONARY: CTA CARDIOVASCULAR: S1 and S2. Regular rate and rhythm. No murmurs, rubs, or gallops.  GASTROINTESTINAL: Soft, nontender, -distended. No masses. Positive bowel sounds. No hepatosplenomegaly.  MUSCULOSKELETAL: No swelling, clubbing, or edema.  R supraclavicular fullness/swelling, minimal tenderness/no erythema/soft cystic extends to the back of neck  NEUROLOGIC:drowsy but arousable,, follows simple commands  doesn't mover RUE  SKIN:intact,warm,dry  INTAKE/OUTPUT  Intake/Output Summary (Last 24 hours) at 01/16/2021 1256 Last data filed at 01/16/2021 1244 Gross per 24 hour  Intake 2640.57 ml  Output 2700 ml  Net -59.43 ml    LABS  CBC Recent Labs  Lab 01/15/21 1859 01/16/21 0132 01/16/21 0533  WBC 12.6* 14.9* 12.5*  HGB 16.7 15.5 15.9  HCT 48.3 45.4 46.2  PLT 279 270 271   Coag's No results for input(s): APTT, INR in the last 168 hours. BMET Recent Labs  Lab 01/15/21 1859 01/16/21 0132 01/16/21 0533  NA 136  --  137  K 4.4  --  4.7  CL 95*  --  99  CO2  19*  --  24  BUN 15  --  13  CREATININE 1.01 0.88 1.02  GLUCOSE 224*  --  187*   Electrolytes Recent Labs  Lab 01/15/21 1859 01/16/21 0533  CALCIUM 9.3 8.6*  MG  --  1.6*  PHOS  --  3.2   Sepsis Markers Recent Labs  Lab 01/16/21 0132 01/16/21 0514 01/16/21 0836  LATICACIDVEN 6.8* 4.0* 2.9*  PROCALCITON 0.35  --   --    ABG No results for input(s): PHART, PCO2ART, PO2ART in the last 168 hours. Liver Enzymes Recent Labs  Lab 01/15/21 1859 01/16/21 0533  AST 85* 137*  ALT 98* 92*  ALKPHOS 100 89  BILITOT 0.6 0.8  ALBUMIN 4.7 3.8   Cardiac Enzymes No results for input(s): TROPONINI, PROBNP in the last 168 hours. Glucose Recent Labs  Lab 01/15/21 1928 01/16/21 0418 01/16/21 0930 01/16/21 1155  GLUCAP 232* 156* 215* 187*     Recent Results (from the past 240 hour(s))  Culture, blood (single)     Status: None (Preliminary result)   Collection Time: 01/15/21 11:16 PM   Specimen: BLOOD  Result Value Ref Range Status   Specimen Description BLOOD LEFT ANTECUBITAL  Final   Special Requests   Final    BOTTLES DRAWN AEROBIC AND ANAEROBIC Blood Culture adequate volume   Culture   Final    NO GROWTH < 12 HOURS Performed at [facility], [address], [place], [place]    Report  Status PENDING  Incomplete  Resp Panel by RT-PCR (Flu A&B, Covid)     Status: None   Collection Time: 01/16/21  1:26 AM  Result Value Ref Range Status   SARS Coronavirus 2 by RT PCR NEGATIVE NEGATIVE Final    Comment: (NOTE) SARS-CoV-2 target nucleic acids are NOT DETECTED.  The SARS-CoV-2 RNA is generally detectable in upper respiratory specimens during the acute phase of infection. The lowest concentration of SARS-CoV-2 viral copies this assay can detect is 138 copies/mL. A negative result does not preclude SARS-Cov-2 infection and should not be used as the sole basis for treatment or other patient management decisions. A negative result may occur with  improper  specimen collection/handling, submission of specimen other than nasopharyngeal swab, presence of viral mutation(s) within the areas targeted by this assay, and inadequate number of viral copies(<138 copies/mL). A negative result must be combined with clinical observations, patient history, and epidemiological information. The expected result is Negative.  Fact Sheet for Patients:  BloggerCourse.com  Fact Sheet for Healthcare Providers:  SeriousBroker.it  This test is no t yet approved or cleared by the [place] FDA and  has been authorized for detection and/or diagnosis of SARS-CoV-2 by FDA under an Emergency Use Authorization (EUA). This EUA will remain  in effect (meaning this test can be used) for the duration of the COVID-19 declaration under Section 564(b)(1) of the Act, 21 U.S.C.section 360bbb-3(b)(1), unless the authorization is terminated  or revoked sooner.       Influenza A by PCR NEGATIVE NEGATIVE Final   Influenza B by PCR NEGATIVE NEGATIVE Final    Comment: (NOTE) The Xpert Xpress SARS-CoV-2/FLU/RSV plus assay is intended as an aid in the diagnosis of influenza from Nasopharyngeal swab specimens and should not be used as a sole basis for treatment. Nasal washings and aspirates are unacceptable for Xpert Xpress SARS-CoV-2/FLU/RSV testing.  Fact Sheet for Patients: BloggerCourse.com  Fact Sheet for Healthcare Providers: SeriousBroker.it  This test is not yet approved or cleared by the [place] FDA and has been authorized for detection and/or diagnosis of SARS-CoV-2 by FDA under an Emergency Use Authorization (EUA). This EUA will remain in effect (meaning this test can be used) for the duration of the COVID-19 declaration under Section 564(b)(1) of the Act, 21 U.S.C. section 360bbb-3(b)(1), unless the authorization is terminated or revoked.  Performed at  [facility], [address] [place], [place] [address]   MRSA PCR Screening     Status: None   Collection Time: 01/16/21  3:22 AM   Specimen: Nasopharyngeal  Result Value Ref Range Status   MRSA by PCR NEGATIVE NEGATIVE Final    Comment:        The GeneXpert MRSA Assay (FDA approved for NASAL specimens only), is one component of a comprehensive MRSA colonization surveillance program. It is not intended to diagnose MRSA infection nor to guide or monitor treatment for MRSA infections. Performed at [facility], [address] [place], [place] [address]     MEDICATIONS   Current Facility-Administered Medications:  .  aztreonam (AZACTAM) 2 g in sodium chloride 0.9 % 100 mL IVPB, 2 g, Intravenous, Q8H, Hall, Scott A, RPH, Stopped at 01/16/21 1048 .  Chlorhexidine Gluconate Cloth 2 % PADS 6 each, 6 each, Topical, Daily, Arbie Cookey, MD, 6 each at 01/16/21 1237 .  docusate sodium (COLACE) capsule 100 mg, 100 mg, Oral, BID PRN, Eugenie Norrie, NP .  enoxaparin (LOVENOX) injection 40 mg, 40 mg, Subcutaneous, Q24H,  Eugenie Norrie, NP, 40 mg at 01/16/21 1025 .  folic acid injection 1 mg, 1 mg, Intravenous, Daily, Blakeney, Dana G, NP, 1 mg at 01/16/21 1134 .  hydrALAZINE (APRESOLINE) injection 10-20 mg, 10-20 mg, Intravenous, Q4H PRN, Eugenie Norrie, NP, 20 mg at 01/16/21 0356 .  lactated ringers infusion, , Intravenous, Continuous, Arnaldo Natal, MD, Last Rate: 150 mL/hr at 01/16/21 1239, New Bag at 01/16/21 1239 .  levETIRAcetam (KEPPRA) IVPB 500 mg/100 mL premix, 500 mg, Intravenous, Q12H, Eugenie Norrie, NP, Stopped at 01/16/21 0932 .  metroNIDAZOLE (FLAGYL) IVPB 500 mg, 500 mg, Intravenous, Q8H, Eugenie Norrie, NP, Stopped at 01/16/21 0932 .  ondansetron (ZOFRAN) injection 4 mg, 4 mg, Intravenous, Q6H PRN, Eugenie Norrie, NP, 4 mg at 01/16/21 0318 .  polyethylene glycol (MIRALAX / GLYCOLAX) packet 17 g, 17 g, Oral, Daily PRN, Eugenie Norrie,  NP .  Melene Muller ON 01/17/2021] thiamine (B-1) injection 100 mg, 100 mg, Intravenous, Q24H, Blakeney, Neldon Newport, NP      Indwelling Urinary Catheter continued, requirement due to   Reason to continue Indwelling Urinary Catheter for strict Intake/Output monitoring for hemodynamic instability   Central Line continued, requirement due to   Reason to continue [employer] Monitoring of central venous pressure or other hemodynamic parameters   Ventilator continued, requirement due to, resp failure    Ventilator Sedation RASS 0 to -2     ASSESSMENT AND PLAN SYNOPSIS   Acute toxic encephalopathy secondary to alcohol intoxication, suspected drug overdose and possible sepsis  Possible postictal state  Hx: Seizure activity current medication noncompliance (01/15/21:  valproic acid level <10) Frequent reorientation  IV narcan x1 dose (given in ER) Cont  iv keppra, thiamine, and folic acid  EEG pending  Once mentation improves will need psychiatry consult and sitter at bedside due to uncertainty if this was a suicide attempt  Urine drug screen + for cocaine,benzodiazapines and Tricyclics  Rhabdomyolysis without renal failure, cont IVF, follow CPK  RUE weakness/neck swelling  likely related to nerve compression from being found on the floor Will get CT neck/upper chest  HTN  Continuous telemetry monitoring  Will treat sbp >180   Anion gap metabolic acidosis  Severe lactic acidosis Better, LA trending down  Continue iv fluid resuscitation  Trend lactic acid and VBG 2 amps of sodium bicarb given in ER, no further bicarb needed as PH is 7.39 with bicarb of 24,  Possible sepsis although etiology unclear  Trend WBC and monitor fever curve  PCT 0.35 Continue abx(AZTREONAM/FLAGYL) therapy for now as outlined above pending culture results   Hypothermia  ,temp better off warming blanket  Type II diabetes mellitus  Hx: hypoglycemia  CBG's q4hrs    Best practice (evaluated  daily)  Diet: NPO Pain/Anxiety/Delirium protocol (if indicated): not indicated  VAP protocol (if indicated): not indicated  DVT prophylaxis: subq lovenox  GI prophylaxis: not indicated  Glucose control: pt with hx of severe hypoglycemia will hold off on SSI until pt able to tolerate diet  Mobility: bedrest  Disposition: stepdown unit   Goals of Care:  Last date of multidisciplinary goals of care discussion: N/A Family and staff present: N/A Summary of discussion: N/A Follow up goals of care discussion due: 01/17/2021 Code Status: Full Code   His mother Gurnoor Ursua was called and updated .She was thankful for the call  Critical Care Time devoted to patient care services described in this note is 35  minutes.   Overall, patient  is critically ill, prognosis is guarded.  Patient with Multiorgan failure and at high risk for cardiac arrest and death.   Arbie CookeyKhalid Sejla Marzano, MD  01/16/2021 12:56 PM [facility]   CT neck as per rad shows Right-sided edema in the posterior triangle of the neck and supraclavicular region likely extending toward the axilla and right upper extremity about the subclavian vessels and brachial plexus. May reflect rhabdomyolysis and myositis given history.  No intervention, follow for now

## 2021-01-16 NOTE — ED Notes (Signed)
Lab at bedside to collect blood.  

## 2021-01-16 NOTE — Progress Notes (Signed)
Pharmacy Antibiotic Note  Brandon Silva is a 60 y.o. male admitted on 01/15/2021 with sepsis.  Pharmacy has been consulted for Aztreonam dosing.  Plan: Aztreonam 2gm x 1 in ED then 1gm IV q8hrs     Temp (24hrs), Avg:92.6 F (33.7 C), Min:92.6 F (33.7 C), Max:92.6 F (33.7 C)  Recent Labs  Lab 01/15/21 1859 01/15/21 2105  WBC 12.6*  --   CREATININE 1.01  --   LATICACIDVEN 7.2* 7.5*    Estimated Creatinine Clearance: 93.8 mL/min (by C-G formula based on SCr of 1.01 mg/dL).    Allergies  Allergen Reactions  . Penicillins Rash    Has patient had a PCN reaction causing immediate rash, facial/tongue/throat swelling, SOB or lightheadedness with hypotension: Yes Has patient had a PCN reaction causing severe rash involving mucus membranes or skin necrosis: No Has patient had a PCN reaction that required hospitalization: No Has patient had a PCN reaction occurring within the last 10 years: No If all of the above answers are "NO", then may proceed with Cephalosporin use.     Antimicrobials this admission:   >>    >>   Dose adjustments this admission:   Microbiology results:  BCx:   UCx:    Sputum:    MRSA PCR:   Thank you for allowing pharmacy to be a part of this patient's care.  Valrie Hart A 01/16/2021 12:17 AM

## 2021-01-16 NOTE — ED Notes (Signed)
Pt resting with eyes closed, no distress noted.

## 2021-01-16 NOTE — Progress Notes (Signed)
Pt with severe rhabdomyolysis along with persistent lactic acidosis will continue aggressive fluid resuscitation and repeat CK/BMP at 12:00 pm today.  Orders placed for indwelling urethral catheter for strict intake/output monitoring, will reassess in 24 hrs for possible discontinuation.  [name] is more arousable and intermittently attempting to follow very simple commands.  Pt able to protect airway with no signs of respiratory distress.  Prn antihypertensive ordered with parameters for bp management.  Will continue to monitor and assess pt.   Sonda Rumble, AGNP  Pulmonary/Critical Care Pager [phone] (please enter 7 digits) PCCM Consult Pager [phone] (please enter 7 digits)

## 2021-01-16 NOTE — ED Notes (Signed)
No change in condition, will continue to monitor.  

## 2021-01-16 NOTE — Progress Notes (Signed)
PHARMACY -  BRIEF ANTIBIOTIC NOTE   Pharmacy has received consult(s) for Vancomycin and Aztreonam from an ED provider.  The patient's profile has been reviewed for ht/wt/allergies/indication/available labs.    One time order(s) placed for Vancomycin, Aztreonam  Further antibiotics/pharmacy consults should be ordered by admitting physician if indicated.                       Thank you, Valrie Hart A 01/16/2021  12:19 AM

## 2021-01-16 NOTE — ED Notes (Signed)
Report received from Amy RN. Patient care assumed. Patient/RN introduction complete. Will continue to monitor.  

## 2021-01-16 NOTE — ED Notes (Signed)
No respone to narcan, pt sleeping  nsr on monitor.  Iv fluids infusing.

## 2021-01-17 DIAGNOSIS — G9340 Encephalopathy, unspecified: Secondary | ICD-10-CM

## 2021-01-17 DIAGNOSIS — Z789 Other specified health status: Secondary | ICD-10-CM

## 2021-01-17 DIAGNOSIS — Z7289 Other problems related to lifestyle: Secondary | ICD-10-CM

## 2021-01-17 DIAGNOSIS — G40909 Epilepsy, unspecified, not intractable, without status epilepticus: Secondary | ICD-10-CM

## 2021-01-17 DIAGNOSIS — F1410 Cocaine abuse, uncomplicated: Secondary | ICD-10-CM

## 2021-01-17 DIAGNOSIS — F1010 Alcohol abuse, uncomplicated: Secondary | ICD-10-CM

## 2021-01-17 LAB — URINE CULTURE: Culture: NO GROWTH

## 2021-01-17 LAB — CBC WITH DIFFERENTIAL/PLATELET
Abs Immature Granulocytes: 0.03 10*3/uL (ref 0.00–0.07)
Basophils Absolute: 0 10*3/uL (ref 0.0–0.1)
Basophils Relative: 1 %
Eosinophils Absolute: 0 10*3/uL (ref 0.0–0.5)
Eosinophils Relative: 0 %
HCT: 44.8 % (ref 39.0–52.0)
Hemoglobin: 15.4 g/dL (ref 13.0–17.0)
Immature Granulocytes: 0 %
Lymphocytes Relative: 13 %
Lymphs Abs: 1.1 10*3/uL (ref 0.7–4.0)
MCH: 31.4 pg (ref 26.0–34.0)
MCHC: 34.4 g/dL (ref 30.0–36.0)
MCV: 91.4 fL (ref 80.0–100.0)
Monocytes Absolute: 0.7 10*3/uL (ref 0.1–1.0)
Monocytes Relative: 8 %
Neutro Abs: 6.6 10*3/uL (ref 1.7–7.7)
Neutrophils Relative %: 78 %
Platelets: 238 10*3/uL (ref 150–400)
RBC: 4.9 MIL/uL (ref 4.22–5.81)
RDW: 12.9 % (ref 11.5–15.5)
WBC: 8.5 10*3/uL (ref 4.0–10.5)
nRBC: 0 % (ref 0.0–0.2)

## 2021-01-17 LAB — PHOSPHORUS: Phosphorus: 2.6 mg/dL (ref 2.5–4.6)

## 2021-01-17 LAB — BASIC METABOLIC PANEL
Anion gap: 10 (ref 5–15)
BUN: 11 mg/dL (ref 6–20)
CO2: 25 mmol/L (ref 22–32)
Calcium: 8.6 mg/dL — ABNORMAL LOW (ref 8.9–10.3)
Chloride: 104 mmol/L (ref 98–111)
Creatinine, Ser: 1.2 mg/dL (ref 0.61–1.24)
GFR, Estimated: >60 mL/min (ref >60)
Glucose, Bld: 171 mg/dL — ABNORMAL HIGH (ref 70–99)
Potassium: 4.3 mmol/L (ref 3.5–5.1)
Sodium: 139 mmol/L (ref 135–145)

## 2021-01-17 LAB — HEMOGLOBIN A1C
Hgb A1c MFr Bld: 7.6 % — ABNORMAL HIGH (ref 4.8–5.6)
Mean Plasma Glucose: 171.42 mg/dL

## 2021-01-17 LAB — PROCALCITONIN: Procalcitonin: 0.27 ng/mL

## 2021-01-17 LAB — GLUCOSE, CAPILLARY
Glucose-Capillary: 144 mg/dL — ABNORMAL HIGH (ref 70–99)
Glucose-Capillary: 200 mg/dL — ABNORMAL HIGH (ref 70–99)
Glucose-Capillary: 243 mg/dL — ABNORMAL HIGH (ref 70–99)

## 2021-01-17 LAB — MAGNESIUM: Magnesium: 1.9 mg/dL (ref 1.7–2.4)

## 2021-01-17 LAB — CK: Total CK: 9265 U/L — ABNORMAL HIGH (ref 49–397)

## 2021-01-17 MED ORDER — AMLODIPINE BESYLATE 5 MG PO TABS
5.0000 mg | ORAL_TABLET | Freq: Every day | ORAL | Status: DC
  Filled 2021-01-17: qty 1

## 2021-01-17 NOTE — Progress Notes (Signed)
PT Cancellation Note  Patient Details Name: Brandon Silva MRN: [id number] DOB: [birth year]   Cancelled Treatment:    Reason Eval/Treat Not Completed: Other (comment) Chart reviewed and arrived at pt's room.  He was agitated and telling nurse that he was leaving today.  PT introduced himself and discussed the need to work with PT and assess/promote mobility.  He unequivocally refused PT and continued to adamantly refuse despite gentle encouragement from PT and nurse.  Still hope to be able to see him this afternoon, will try to coordinate as much with nursing.    Malachi Pro, DPT 01/17/2021, 12:33 PM

## 2021-01-17 NOTE — Discharge Instructions (Signed)
Left against medical advice

## 2021-01-17 NOTE — Consult Note (Signed)
[facility] Face-to-Face Psychiatry Consult   Reason for Consult: Consult for 60 year old man came to the emergency room once again with altered mental status to the extent of having to be admitted to the intensive care unit Referring Physician:  Allena Napoleon Patient Identification: Brandon Silva MRN:  [id number] Principal Diagnosis: Acute encephalopathy Diagnosis:  Principal Problem:   Acute encephalopathy Active Problems:   Non compliance w medication regimen   Hypothermia   Traumatic rhabdomyolysis (HCC)   Drug abuse (HCC)   Acidosis, lactic   Alcohol abuse   Cocaine abuse (HCC)   Seizure disorder (HCC)   Total Time spent with patient: 45 minutes  Subjective:   Brandon Silva is a 60 y.o. male patient admitted with "I want to go home".  HPI: Patient seen chart reviewed.  60 year old man presented to the emergency room 2 days ago with altered mental status.  Details are a little unclear as to how he was found but evidently someone found him unconscious at home.  The description from emergency staff is that there were beer bottles and pill bottles scattered but no labels that could be identified.  Patient was unable to protect his airway and had to be intubated and admitted to the ICU.  He is now extubated and breathing normally.  On interview today the patient has no memory of being admitted to the hospital.  He is somewhat uncooperative with interview just repeatedly saying that he wants to leave because he needs to take care of his animals at home.  Patient denies that he did anything in any intent to harm or kill himself.  I pointed out that there was a report of bottles of pills in the area patient said that he thinks those must have belonged to his girlfriend he claimed to know nothing about it.  Patient was evasive about history denying that he had been using any drugs recently even though his drug screen is positive for cocaine.  As usual minimizing the amount that he was drinking.  Blood alcohol  level was only about 100 on presentation.  No seizure activity noted.  Collateral information in the chart from his mother that his girlfriend had left him a few weeks ago and that she thought he might be depressed about it but no report of any mention of suicide.  Patient is currently alert and oriented x4.  Insight and willingness to engage in conversation is poor as it has been often in the past.  He is now asking to be released from the hospital.  He is not currently following up with any outpatient mental health provider.  He claims that he still takes his seizure medication but does not know what it is.  Depakote level was 0.  Checking the controlled substance database no record of any controlled substance prescriptions anytime recently.  Past Psychiatric History: Patient has a past history over the years of intermittent presentations to the hospital under almost identical circumstances.  Long history of substance abuse problems which he has refused to engage in treatment of.  History of seizures of unclear etiology possibly alcohol withdrawal induced.  No report or evidence of any actual attempts to kill himself.  Many years ago there had been reports of him presenting with psychotic symptoms.  That has not been part of his presentation anytime recently.  Risk to Self:   Risk to Others:   Prior Inpatient Therapy:   Prior Outpatient Therapy:    Past Medical History:  Past Medical History:  Diagnosis  Date  . Alcohol abuse   . Diabetes mellitus without complication (HCC)   . Seizures (HCC)    No past surgical history on file. Family History:  Family History  Problem Relation Age of Onset  . Diabetes Other    Family Psychiatric  History: None reported Social History:  Social History   Substance and Sexual Activity  Alcohol Use Yes     Social History   Substance and Sexual Activity  Drug Use No    Social History   Socioeconomic History  . Marital status: Single    Spouse name:  Not on file  . Number of children: Not on file  . Years of education: Not on file  . Highest education level: Not on file  Occupational History  . Not on file  Tobacco Use  . Smoking status: Current Every Day Smoker    Types: Cigarettes  . Smokeless tobacco: Never Used  Substance and Sexual Activity  . Alcohol use: Yes  . Drug use: No  . Sexual activity: Yes  Other Topics Concern  . Not on file  Social History Narrative  . Not on file   Social Determinants of Health   Financial Resource Strain: Not on file  Food Insecurity: Not on file  Transportation Needs: Not on file  Physical Activity: Not on file  Stress: Not on file  Social Connections: Not on file   Additional Social History:    Allergies:   Allergies  Allergen Reactions  . Penicillins Rash    Has patient had a PCN reaction causing immediate rash, facial/tongue/throat swelling, SOB or lightheadedness with hypotension: Yes Has patient had a PCN reaction causing severe rash involving mucus membranes or skin necrosis: No Has patient had a PCN reaction that required hospitalization: No Has patient had a PCN reaction occurring within the last 10 years: No If all of the above answers are "NO", then may proceed with Cephalosporin use.     Labs:  Results for orders placed or performed during the hospital encounter of 01/15/21 (from the past 48 hour(s))  Blood gas, venous     Status: Abnormal (Preliminary result)   Collection Time: 01/15/21  6:48 PM  Result Value Ref Range   FIO2 PENDING    pH, Ven 7.26 7.250 - 7.430   pCO2, Ven 45 44.0 - 60.0 mmHg   pO2, Ven 34.0 32.0 - 45.0 mmHg   Bicarbonate 20.2 20.0 - 28.0 mmol/L   Acid-base deficit 6.9 (H) 0.0 - 2.0 mmol/L   O2 Saturation 54.8 %   Patient temperature 37.0    Collection site VENOUS    Sample type VENOUS     Comment: Performed at [facility], [address] [place], [place] [address]  Acetaminophen level     Status: Abnormal   Collection Time:  01/15/21  6:59 PM  Result Value Ref Range   Acetaminophen (Tylenol), Serum <10 (L) 10 - 30 ug/mL    Comment: (NOTE) Therapeutic concentrations vary significantly. A range of 10-30 ug/mL  may be an effective concentration for many patients. However, some  are best treated at concentrations outside of this range. Acetaminophen concentrations >150 ug/mL at 4 hours after ingestion  and >50 ug/mL at 12 hours after ingestion are often associated with  toxic reactions.  Performed at [facility], [address] [place], [place] [address]   Comprehensive metabolic panel     Status: Abnormal   Collection Time: 01/15/21  6:59 PM  Result Value Ref Range  Sodium 136 135 - 145 mmol/L    Comment: LYTES REPEATED TO CONFIRM  RH   Potassium 4.4 3.5 - 5.1 mmol/L   Chloride 95 (L) 98 - 111 mmol/L   CO2 19 (L) 22 - 32 mmol/L   Glucose, Bld 224 (H) 70 - 99 mg/dL    Comment: Glucose reference range applies only to samples taken after fasting for at least 8 hours.   BUN 15 6 - 20 mg/dL   Creatinine, Ser 3.09 0.61 - 1.24 mg/dL   Calcium 9.3 8.9 - 40.7 mg/dL   Total Protein 8.6 (H) 6.5 - 8.1 g/dL   Albumin 4.7 3.5 - 5.0 g/dL   AST 85 (H) 15 - 41 U/L   ALT 98 (H) 0 - 44 U/L   Alkaline Phosphatase 100 38 - 126 U/L   Total Bilirubin 0.6 0.3 - 1.2 mg/dL   GFR, Estimated >68 >08 mL/min    Comment: (NOTE) Calculated using the CKD-EPI Creatinine Equation (2021)    Anion gap 22 (H) 5 - 15    Comment: Performed at [facility], [address] [place], [place] [address]  Ethanol     Status: Abnormal   Collection Time: 01/15/21  6:59 PM  Result Value Ref Range   Alcohol, Ethyl (B) 104 (H) <10 mg/dL    Comment: (NOTE) Lowest detectable limit for serum alcohol is 10 mg/dL.  For medical purposes only. Performed at [facility], [address] [place], [place] [address]   Brain natriuretic peptide     Status: None   Collection Time: 01/15/21  6:59 PM  Result Value Ref  Range   B Natriuretic Peptide 31.9 0.0 - 100.0 pg/mL    Comment: Performed at [facility], [address] [place], [place] [address]  Salicylate level     Status: Abnormal   Collection Time: 01/15/21  6:59 PM  Result Value Ref Range   Salicylate Lvl <7.0 (L) 7.0 - 30.0 mg/dL    Comment: Performed at [facility], [address] [place], [place] [address]  Troponin I (High Sensitivity)     Status: None   Collection Time: 01/15/21  6:59 PM  Result Value Ref Range   Troponin I (High Sensitivity) 6 <18 ng/L    Comment: (NOTE) Elevated high sensitivity troponin I (hsTnI) values and significant  changes across serial measurements may suggest ACS but many other  chronic and acute conditions are known to elevate hsTnI results.  Refer to the "Links" section for chest pain algorithms and additional  guidance. Performed at [facility], [address] [place], [place] [address]   Lactic acid, plasma     Status: Abnormal   Collection Time: 01/15/21  6:59 PM  Result Value Ref Range   Lactic Acid, Venous 7.2 (HH) 0.5 - 1.9 mmol/L    Comment: CRITICAL RESULT CALLED TO, READ BACK BY AND VERIFIED WITH CRISTINE HALLAS AT 2117 ON 01/15/21 BY SS Performed at [facility], [address] [place], [place] [address]   CBC with Differential     Status: Abnormal   Collection Time: 01/15/21  6:59 PM  Result Value Ref Range   WBC 12.6 (H) 4.0 - 10.5 K/uL   RBC 5.35 4.22 - 5.81 MIL/uL   Hemoglobin 16.7 13.0 - 17.0 g/dL   HCT 57.9 03.8 - 33.3 %   MCV 90.3 80.0 - 100.0 fL   MCH 31.2 26.0 - 34.0 pg   MCHC 34.6 30.0 - 36.0 g/dL  RDW 12.4 11.5 - 15.5 %   Platelets 279 150 - 400 K/uL   nRBC 0.0 0.0 - 0.2 %   Neutrophils Relative % 87 %   Neutro Abs 11.0 (H) 1.7 - 7.7 K/uL   Lymphocytes Relative 8 %   Lymphs Abs 1.0 0.7 - 4.0 K/uL   Monocytes Relative 4 %   Monocytes Absolute 0.5 0.1 - 1.0 K/uL   Eosinophils Relative 0 %   Eosinophils Absolute 0.0 0.0 - 0.5  K/uL   Basophils Relative 0 %   Basophils Absolute 0.0 0.0 - 0.1 K/uL   Immature Granulocytes 1 %   Abs Immature Granulocytes 0.09 (H) 0.00 - 0.07 K/uL    Comment: Performed at [facility], [address] [place], [place] [address]  Valproic acid level     Status: Abnormal   Collection Time: 01/15/21  6:59 PM  Result Value Ref Range   Valproic Acid Lvl <10 (L) 50.0 - 100.0 ug/mL    Comment: RESULT CONFIRMED BY MANUAL DILUTION RH Performed at [facility], [address] [place], [place] [address]   POC CBG, ED     Status: Abnormal   Collection Time: 01/15/21  7:28 PM  Result Value Ref Range   Glucose-Capillary 232 (H) 70 - 99 mg/dL    Comment: Glucose reference range applies only to samples taken after fasting for at least 8 hours.  Lactic acid, plasma     Status: Abnormal   Collection Time: 01/15/21  9:05 PM  Result Value Ref Range   Lactic Acid, Venous 7.5 (HH) 0.5 - 1.9 mmol/L    Comment: CRITICAL VALUE NOTED. VALUE IS CONSISTENT WITH PREVIOUSLY REPORTED/CALLED VALUE RH Performed at [facility], [address] [place], [place] [address]   Urinalysis, Complete w Microscopic     Status: Abnormal   Collection Time: 01/15/21  9:05 PM  Result Value Ref Range   Color, Urine YELLOW (A) YELLOW   APPearance HAZY (A) CLEAR   Specific Gravity, Urine 1.025 1.005 - 1.030   pH 5.0 5.0 - 8.0   Glucose, UA >=500 (A) NEGATIVE mg/dL   Hgb urine dipstick LARGE (A) NEGATIVE   Bilirubin Urine NEGATIVE NEGATIVE   Ketones, ur 20 (A) NEGATIVE mg/dL   Protein, ur 914 (A) NEGATIVE mg/dL   Nitrite NEGATIVE NEGATIVE   Leukocytes,Ua NEGATIVE NEGATIVE   WBC, UA 6-10 0 - 5 WBC/hpf   Bacteria, UA NONE SEEN NONE SEEN   Squamous Epithelial / LPF 0-5 0 - 5    Comment: Performed at [facility], [address] [place], [place] [address]  Urine Drug Screen, Qualitative     Status: Abnormal   Collection Time: 01/15/21  9:05 PM  Result Value Ref Range    Tricyclic, Ur Screen POSITIVE (A) NONE DETECTED   Amphetamines, Ur Screen NONE DETECTED NONE DETECTED   MDMA (Ecstasy)Ur Screen NONE DETECTED NONE DETECTED   Cocaine Metabolite,Ur [place] POSITIVE (A) NONE DETECTED   Opiate, Ur Screen NONE DETECTED NONE DETECTED   Phencyclidine (PCP) Ur S NONE DETECTED NONE DETECTED   Cannabinoid 50 Ng, Ur [place] NONE DETECTED NONE DETECTED   Barbiturates, Ur Screen NONE DETECTED NONE DETECTED   Benzodiazepine, Ur Scrn NONE DETECTED NONE DETECTED   Methadone Scn, Ur NONE DETECTED NONE DETECTED    Comment: (NOTE) Tricyclics + metabolites, urine    Cutoff 1000 ng/mL Amphetamines + metabolites, urine  Cutoff 1000 ng/mL MDMA (Ecstasy), urine  Cutoff 500 ng/mL Cocaine Metabolite, urine          Cutoff 300 ng/mL Opiate + metabolites, urine        Cutoff 300 ng/mL Phencyclidine (PCP), urine         Cutoff 25 ng/mL Cannabinoid, urine                 Cutoff 50 ng/mL Barbiturates + metabolites, urine  Cutoff 200 ng/mL Benzodiazepine, urine              Cutoff 200 ng/mL Methadone, urine                   Cutoff 300 ng/mL  The urine drug screen provides only a preliminary, unconfirmed analytical test result and should not be used for non-medical purposes. Clinical consideration and professional judgment should be applied to any positive drug screen result due to possible interfering substances. A more specific alternate chemical method must be used in order to obtain a confirmed analytical result. Gas chromatography / mass spectrometry (GC/MS) is the preferred confirm atory method. Performed at [facility], [address] [place], [place] [address]   Ammonia     Status: None   Collection Time: 01/15/21  9:05 PM  Result Value Ref Range   Ammonia 20 9 - 35 umol/L    Comment: Performed at [facility], [address] [place], [place] [address]  Troponin I (High Sensitivity)     Status: None   Collection Time: 01/15/21  9:05 PM   Result Value Ref Range   Troponin I (High Sensitivity) 6 <18 ng/L    Comment: (NOTE) Elevated high sensitivity troponin I (hsTnI) values and significant  changes across serial measurements may suggest ACS but many other  chronic and acute conditions are known to elevate hsTnI results.  Refer to the "Links" section for chest pain algorithms and additional  guidance. Performed at [facility], [address] [place], [place] [address]   Urine Culture     Status: None   Collection Time: 01/15/21  9:05 PM   Specimen: Urine, Random  Result Value Ref Range   Specimen Description      URINE, RANDOM Performed at [facility], [address] [place], [place] [address]    Special Requests      NONE Performed at [facility], [address], [place], [place] [address]    Culture      NO GROWTH Performed at [facility], [address], [place], [place] [address]    Report Status 01/17/2021 FINAL   Culture, blood (single)     Status: None (Preliminary result)   Collection Time: 01/15/21 11:16 PM   Specimen: BLOOD  Result Value Ref Range   Specimen Description BLOOD LEFT ANTECUBITAL    Special Requests      BOTTLES DRAWN AEROBIC AND ANAEROBIC Blood Culture adequate volume   Culture      NO GROWTH 2 DAYS Performed at [facility], [address], [place], [place] [address]    Report Status PENDING   Resp Panel by RT-PCR (Flu A&B, Covid)     Status: None   Collection Time: 01/16/21  1:26 AM  Result Value Ref Range   SARS Coronavirus 2 by RT PCR NEGATIVE NEGATIVE    Comment: (NOTE) SARS-CoV-2 target nucleic acids are NOT DETECTED.  The SARS-CoV-2 RNA is generally detectable in upper respiratory specimens during the acute phase of infection. The lowest concentration of  SARS-CoV-2 viral copies this assay can detect is 138 copies/mL. A negative result does not preclude SARS-Cov-2 infection and should not be used as the sole basis for  treatment or other patient management decisions. A negative result may occur with  improper specimen collection/handling, submission of specimen other than nasopharyngeal swab, presence of viral mutation(s) within the areas targeted by this assay, and inadequate number of viral copies(<138 copies/mL). A negative result must be combined with clinical observations, patient history, and epidemiological information. The expected result is Negative.  Fact Sheet for Patients:  BloggerCourse.com  Fact Sheet for Healthcare Providers:  SeriousBroker.it  This test is no t yet approved or cleared by the [place] FDA and  has been authorized for detection and/or diagnosis of SARS-CoV-2 by FDA under an Emergency Use Authorization (EUA). This EUA will remain  in effect (meaning this test can be used) for the duration of the COVID-19 declaration under Section 564(b)(1) of the Act, 21 U.S.C.section 360bbb-3(b)(1), unless the authorization is terminated  or revoked sooner.       Influenza A by PCR NEGATIVE NEGATIVE   Influenza B by PCR NEGATIVE NEGATIVE    Comment: (NOTE) The Xpert Xpress SARS-CoV-2/FLU/RSV plus assay is intended as an aid in the diagnosis of influenza from Nasopharyngeal swab specimens and should not be used as a sole basis for treatment. Nasal washings and aspirates are unacceptable for Xpert Xpress SARS-CoV-2/FLU/RSV testing.  Fact Sheet for Patients: BloggerCourse.com  Fact Sheet for Healthcare Providers: SeriousBroker.it  This test is not yet approved or cleared by the [place] FDA and has been authorized for detection and/or diagnosis of SARS-CoV-2 by FDA under an Emergency Use Authorization (EUA). This EUA will remain in effect (meaning this test can be used) for the duration of the COVID-19 declaration under Section 564(b)(1) of the Act, 21 U.S.C. section  360bbb-3(b)(1), unless the authorization is terminated or revoked.  Performed at [facility], [address] [place], [place] [address]   TSH     Status: None   Collection Time: 01/16/21  1:32 AM  Result Value Ref Range   TSH 0.521 0.350 - 4.500 uIU/mL    Comment: Performed by a 3rd Generation assay with a functional sensitivity of <=0.01 uIU/mL. Performed at [facility], [address] [place], [place] [address]   CBC     Status: Abnormal   Collection Time: 01/16/21  1:32 AM  Result Value Ref Range   WBC 14.9 (H) 4.0 - 10.5 K/uL   RBC 5.06 4.22 - 5.81 MIL/uL   Hemoglobin 15.5 13.0 - 17.0 g/dL   HCT 09.8 11.9 - 14.7 %   MCV 89.7 80.0 - 100.0 fL   MCH 30.6 26.0 - 34.0 pg   MCHC 34.1 30.0 - 36.0 g/dL   RDW 82.9 56.2 - 13.0 %   Platelets 270 150 - 400 K/uL   nRBC 0.0 0.0 - 0.2 %    Comment: Performed at [facility], [address] [place], [place] [address]  Creatinine, serum     Status: None   Collection Time: 01/16/21  1:32 AM  Result Value Ref Range   Creatinine, Ser 0.88 0.61 - 1.24 mg/dL   GFR, Estimated >46 >96 mL/min    Comment: (NOTE) Calculated using the CKD-EPI Creatinine Equation (2021) Performed at [facility], [address], [place], [place] [address]   Procalcitonin     Status: None   Collection Time: 01/16/21  1:32 AM  Result Value Ref Range  Procalcitonin 0.35 ng/mL    Comment:        Interpretation: PCT (Procalcitonin) <= 0.5 ng/mL: Systemic infection (sepsis) is not likely. Local bacterial infection is possible. (NOTE)       Sepsis PCT Algorithm           Lower Respiratory Tract                                      Infection PCT Algorithm    ----------------------------     ----------------------------         PCT < 0.25 ng/mL                PCT < 0.10 ng/mL          Strongly encourage             Strongly discourage   discontinuation of antibiotics    initiation of antibiotics     ----------------------------     -----------------------------       PCT 0.25 - 0.50 ng/mL            PCT 0.10 - 0.25 ng/mL               OR       >80% decrease in PCT            Discourage initiation of                                            antibiotics      Encourage discontinuation           of antibiotics    ----------------------------     -----------------------------         PCT >= 0.50 ng/mL              PCT 0.26 - 0.50 ng/mL               AND        <80% decrease in PCT             Encourage initiation of                                             antibiotics       Encourage continuation           of antibiotics    ----------------------------     -----------------------------        PCT >= 0.50 ng/mL                  PCT > 0.50 ng/mL               AND         increase in PCT                  Strongly encourage                                      initiation of antibiotics    Strongly encourage escalation           of antibiotics                                     -----------------------------  PCT <= 0.25 ng/mL                                                 OR                                        > 80% decrease in PCT                                      Discontinue / Do not initiate                                             antibiotics  Performed at [facility], [address] [place], [place] [address]   CK     Status: Abnormal   Collection Time: 01/16/21  1:32 AM  Result Value Ref Range   Total CK 11,149 (H) 49 - 397 U/L    Comment: RESULT CONFIRMED BY MANUAL DILUTION SKL Performed at [facility], [address] [place], [place] [address]   Lactic acid, plasma     Status: Abnormal   Collection Time: 01/16/21  1:32 AM  Result Value Ref Range   Lactic Acid, Venous 6.8 (HH) 0.5 - 1.9 mmol/L    Comment: CRITICAL VALUE NOTED. VALUE IS CONSISTENT WITH PREVIOUSLY REPORTED/CALLED VALUE  SKL Performed at [facility], [address] [place], [place] [address]   Hemoglobin A1c     Status: Abnormal   Collection Time: 01/16/21  1:32 AM  Result Value Ref Range   Hgb A1c MFr Bld 7.6 (H) 4.8 - 5.6 %    Comment: (NOTE) Pre diabetes:          5.7%-6.4%  Diabetes:              >6.4%  Glycemic control for   <7.0% adults with diabetes    Mean Plasma Glucose 171.42 mg/dL    Comment: Performed at [facility], [address] [place], [place] [address]  MRSA PCR Screening     Status: None   Collection Time: 01/16/21  3:22 AM   Specimen: Nasopharyngeal  Result Value Ref Range   MRSA by PCR NEGATIVE NEGATIVE    Comment:        The GeneXpert MRSA Assay (FDA approved for NASAL specimens only), is one component of a comprehensive MRSA colonization surveillance program. It is not intended to diagnose MRSA infection nor to guide or monitor treatment for MRSA infections. Performed at [facility], [address] [place], [place] [address]   CBG monitoring, ED     Status: Abnormal   Collection Time: 01/16/21  4:18 AM  Result Value Ref Range   Glucose-Capillary 156 (H) 70 - 99 mg/dL    Comment: Glucose reference range applies only to samples taken after fasting for at least 8 hours.  Lactic acid, plasma     Status: Abnormal   Collection Time: 01/16/21  5:14 AM  Result Value Ref Range   Lactic Acid, Venous 4.0 (HH) 0.5 - 1.9 mmol/L  Comment: CRITICAL VALUE NOTED. VALUE IS CONSISTENT WITH PREVIOUSLY REPORTED/CALLED VALUE RH Performed at [facility], [address] [place], [place] [address]   CBC     Status: Abnormal   Collection Time: 01/16/21  5:33 AM  Result Value Ref Range   WBC 12.5 (H) 4.0 - 10.5 K/uL   RBC 5.19 4.22 - 5.81 MIL/uL   Hemoglobin 15.9 13.0 - 17.0 g/dL   HCT 26.4 15.8 - 30.9 %   MCV 89.0 80.0 - 100.0 fL   MCH 30.6 26.0 - 34.0 pg   MCHC 34.4 30.0 - 36.0 g/dL   RDW 40.7 68.0 - 88.1 %   Platelets 271 150 - 400  K/uL   nRBC 0.0 0.0 - 0.2 %    Comment: Performed at [facility], [address], [place], [place] [address]  Basic metabolic panel     Status: Abnormal   Collection Time: 01/16/21  5:33 AM  Result Value Ref Range   Sodium 137 135 - 145 mmol/L   Potassium 4.7 3.5 - 5.1 mmol/L   Chloride 99 98 - 111 mmol/L   CO2 24 22 - 32 mmol/L   Glucose, Bld 187 (H) 70 - 99 mg/dL    Comment: Glucose reference range applies only to samples taken after fasting for at least 8 hours.   BUN 13 6 - 20 mg/dL   Creatinine, Ser 9.45 0.61 - 1.24 mg/dL   Calcium 8.6 (L) 8.9 - 10.3 mg/dL   GFR, Estimated >85 >92 mL/min    Comment: (NOTE) Calculated using the CKD-EPI Creatinine Equation (2021)    Anion gap 14 5 - 15    Comment: Performed at [facility], [address] [place], [place] [address]  Magnesium     Status: Abnormal   Collection Time: 01/16/21  5:33 AM  Result Value Ref Range   Magnesium 1.6 (L) 1.7 - 2.4 mg/dL    Comment: Performed at [facility], [address] [place], [place] [address]  Phosphorus     Status: None   Collection Time: 01/16/21  5:33 AM  Result Value Ref Range   Phosphorus 3.2 2.5 - 4.6 mg/dL    Comment: Performed at [facility], [address] [place], [place] [address]  Blood gas, venous     Status: Abnormal   Collection Time: 01/16/21  5:33 AM  Result Value Ref Range   pH, Ven 7.46 (H) 7.250 - 7.430   pCO2, Ven 37 (L) 44.0 - 60.0 mmHg   pO2, Ven <31.0 (LL) 32.0 - 45.0 mmHg   Bicarbonate 26.3 20.0 - 28.0 mmol/L   Acid-Base Excess 2.6 (H) 0.0 - 2.0 mmol/L   O2 Saturation 52.6 %   Patient temperature 37.0    Collection site VENOUS    Sample type VENOUS     Comment: Performed at [facility], [address] [place], [place] [address]  Hepatic function panel     Status: Abnormal   Collection Time: 01/16/21  5:33 AM  Result Value Ref Range   Total Protein 7.1 6.5 - 8.1 g/dL   Albumin 3.8 3.5 - 5.0 g/dL   AST 383  (H) 15 - 41 U/L   ALT 92 (H) 0 - 44 U/L   Alkaline Phosphatase 89 38 - 126 U/L   Total Bilirubin 0.8 0.3 - 1.2 mg/dL   Bilirubin, Direct 0.1 0.0 - 0.2 mg/dL   Indirect Bilirubin 0.7 0.3 - 0.9 mg/dL    Comment: Performed at  [facility], [address] [place], [place] [address]  Lactic acid, plasma     Status: Abnormal   Collection Time: 01/16/21  8:36 AM  Result Value Ref Range   Lactic Acid, Venous 2.9 (HH) 0.5 - 1.9 mmol/L    Comment: CRITICAL VALUE NOTED. VALUE IS CONSISTENT WITH PREVIOUSLY REPORTED/CALLED VALUE KLW Performed at [facility], [address] [place], [place] [address]   CBG monitoring, ED     Status: Abnormal   Collection Time: 01/16/21  9:30 AM  Result Value Ref Range   Glucose-Capillary 215 (H) 70 - 99 mg/dL    Comment: Glucose reference range applies only to samples taken after fasting for at least 8 hours.  Glucose, capillary     Status: Abnormal   Collection Time: 01/16/21 11:55 AM  Result Value Ref Range   Glucose-Capillary 187 (H) 70 - 99 mg/dL    Comment: Glucose reference range applies only to samples taken after fasting for at least 8 hours.  CK     Status: Abnormal   Collection Time: 01/16/21  3:16 PM  Result Value Ref Range   Total CK 12,705 (H) 49 - 397 U/L    Comment: RESULT CONFIRMED BY MANUAL DILUTION SNG Performed at [facility], [address] [place], [place] [address]   Basic metabolic panel     Status: Abnormal   Collection Time: 01/16/21  3:16 PM  Result Value Ref Range   Sodium 140 135 - 145 mmol/L   Potassium 4.5 3.5 - 5.1 mmol/L   Chloride 100 98 - 111 mmol/L   CO2 26 22 - 32 mmol/L   Glucose, Bld 195 (H) 70 - 99 mg/dL    Comment: Glucose reference range applies only to samples taken after fasting for at least 8 hours.   BUN 12 6 - 20 mg/dL   Creatinine, Ser 9.14 0.61 - 1.24 mg/dL   Calcium 8.4 (L) 8.9 - 10.3 mg/dL   GFR, Estimated >78 >29 mL/min    Comment: (NOTE) Calculated using the CKD-EPI  Creatinine Equation (2021)    Anion gap 14 5 - 15    Comment: Performed at [facility], [address] [place], [place] [address]  Lactic acid, plasma     Status: Abnormal   Collection Time: 01/16/21  3:16 PM  Result Value Ref Range   Lactic Acid, Venous 3.6 (HH) 0.5 - 1.9 mmol/L    Comment: CRITICAL VALUE NOTED. VALUE IS CONSISTENT WITH PREVIOUSLY REPORTED/CALLED VALUE MJU Performed at [facility], [address] [place], [place] [address]   Glucose, capillary     Status: Abnormal   Collection Time: 01/16/21  3:38 PM  Result Value Ref Range   Glucose-Capillary 179 (H) 70 - 99 mg/dL    Comment: Glucose reference range applies only to samples taken after fasting for at least 8 hours.  Lactic acid, plasma     Status: Abnormal   Collection Time: 01/16/21  6:30 PM  Result Value Ref Range   Lactic Acid, Venous 3.1 (HH) 0.5 - 1.9 mmol/L    Comment: CRITICAL VALUE NOTED. VALUE IS CONSISTENT WITH PREVIOUSLY REPORTED/CALLED VALUE MJU Performed at [facility], [address] [place], [place] [address]   Glucose, capillary     Status: Abnormal   Collection Time: 01/17/21 12:15 AM  Result Value Ref Range   Glucose-Capillary 200 (H) 70 - 99 mg/dL    Comment: Glucose reference range applies only to samples taken after fasting for  at least 8 hours.  Procalcitonin     Status: None   Collection Time: 01/17/21  7:55 AM  Result Value Ref Range   Procalcitonin 0.27 ng/mL    Comment:        Interpretation: PCT (Procalcitonin) <= 0.5 ng/mL: Systemic infection (sepsis) is not likely. Local bacterial infection is possible. (NOTE)       Sepsis PCT Algorithm           Lower Respiratory Tract                                      Infection PCT Algorithm    ----------------------------     ----------------------------         PCT < 0.25 ng/mL                PCT < 0.10 ng/mL          Strongly encourage             Strongly discourage   discontinuation of antibiotics     initiation of antibiotics    ----------------------------     -----------------------------       PCT 0.25 - 0.50 ng/mL            PCT 0.10 - 0.25 ng/mL               OR       >80% decrease in PCT            Discourage initiation of                                            antibiotics      Encourage discontinuation           of antibiotics    ----------------------------     -----------------------------         PCT >= 0.50 ng/mL              PCT 0.26 - 0.50 ng/mL               AND        <80% decrease in PCT             Encourage initiation of                                             antibiotics       Encourage continuation           of antibiotics    ----------------------------     -----------------------------        PCT >= 0.50 ng/mL                  PCT > 0.50 ng/mL               AND         increase in PCT                  Strongly encourage  initiation of antibiotics    Strongly encourage escalation           of antibiotics                                     -----------------------------                                           PCT <= 0.25 ng/mL                                                 OR                                        > 80% decrease in PCT                                      Discontinue / Do not initiate                                             antibiotics  Performed at [facility], [address] [place], [place] [address]   Basic metabolic panel     Status: Abnormal   Collection Time: 01/17/21  7:55 AM  Result Value Ref Range   Sodium 139 135 - 145 mmol/L   Potassium 4.3 3.5 - 5.1 mmol/L   Chloride 104 98 - 111 mmol/L   CO2 25 22 - 32 mmol/L   Glucose, Bld 171 (H) 70 - 99 mg/dL    Comment: Glucose reference range applies only to samples taken after fasting for at least 8 hours.   BUN 11 6 - 20 mg/dL   Creatinine, Ser 6.041.20 0.61 - 1.24 mg/dL   Calcium 8.6 (L) 8.9 - 10.3 mg/dL   GFR,  Estimated >54>60 >09>60 mL/min    Comment: (NOTE) Calculated using the CKD-EPI Creatinine Equation (2021)    Anion gap 10 5 - 15    Comment: Performed at [facility], [address] [place], [place] [address]  CBC with Differential/Platelet     Status: None   Collection Time: 01/17/21  7:55 AM  Result Value Ref Range   WBC 8.5 4.0 - 10.5 K/uL   RBC 4.90 4.22 - 5.81 MIL/uL   Hemoglobin 15.4 13.0 - 17.0 g/dL   HCT 47.844.8 29.539.0 - 62.152.0 %   MCV 91.4 80.0 - 100.0 fL   MCH 31.4 26.0 - 34.0 pg   MCHC 34.4 30.0 - 36.0 g/dL   RDW 30.812.9 65.711.5 - 84.615.5 %   Platelets 238 150 - 400 K/uL   nRBC 0.0 0.0 - 0.2 %   Neutrophils Relative % 78 %   Neutro Abs 6.6 1.7 - 7.7 K/uL   Lymphocytes Relative 13 %   Lymphs Abs 1.1 0.7 - 4.0 K/uL   Monocytes Relative 8 %   Monocytes Absolute 0.7 0.1 -  1.0 K/uL   Eosinophils Relative 0 %   Eosinophils Absolute 0.0 0.0 - 0.5 K/uL   Basophils Relative 1 %   Basophils Absolute 0.0 0.0 - 0.1 K/uL   Immature Granulocytes 0 %   Abs Immature Granulocytes 0.03 0.00 - 0.07 K/uL    Comment: Performed at [facility], [address] [place], [place] [address]  Magnesium     Status: None   Collection Time: 01/17/21  7:55 AM  Result Value Ref Range   Magnesium 1.9 1.7 - 2.4 mg/dL    Comment: Performed at [facility], [address] [place], [place] [address]  Phosphorus     Status: None   Collection Time: 01/17/21  7:55 AM  Result Value Ref Range   Phosphorus 2.6 2.5 - 4.6 mg/dL    Comment: Performed at [facility], [address] [place], [place] [address]  CK     Status: Abnormal   Collection Time: 01/17/21  7:55 AM  Result Value Ref Range   Total CK 9,265 (H) 49 - 397 U/L    Comment: RESULT CONFIRMED BY MANUAL DILUTION/HKP Performed at [facility], [address] [place], [place] [address]   Glucose, capillary     Status: Abnormal   Collection Time: 01/17/21  8:01 AM  Result Value Ref Range   Glucose-Capillary  144 (H) 70 - 99 mg/dL    Comment: Glucose reference range applies only to samples taken after fasting for at least 8 hours.  Glucose, capillary     Status: Abnormal   Collection Time: 01/17/21 11:42 AM  Result Value Ref Range   Glucose-Capillary 243 (H) 70 - 99 mg/dL    Comment: Glucose reference range applies only to samples taken after fasting for at least 8 hours.    Current Facility-Administered Medications  Medication Dose Route Frequency Provider Last Rate Last Admin  . amLODipine (NORVASC) tablet 5 mg  5 mg Oral Daily Arbie Cookey, MD      . Chlorhexidine Gluconate Cloth 2 % PADS 6 each  6 each Topical Daily Arbie Cookey, MD   6 each at 01/16/21 1237  . docusate sodium (COLACE) capsule 100 mg  100 mg Oral BID PRN Eugenie Norrie, NP      . folic acid injection 1 mg  1 mg Intravenous Daily Eugenie Norrie, NP   1 mg at 01/17/21 2956  . hydrALAZINE (APRESOLINE) injection 10-20 mg  10-20 mg Intravenous Q4H PRN Eugenie Norrie, NP   20 mg at 01/16/21 1945  . insulin aspart (novoLOG) injection 0-15 Units  0-15 Units Subcutaneous TID WC Arbie Cookey, MD   2 Units at 01/17/21 [phone]  . lactated ringers infusion   Intravenous Continuous Arbie Cookey, MD 50 mL/hr at 01/17/21 1214 Rate Change at 01/17/21 1214  . levETIRAcetam (KEPPRA) IVPB 500 mg/100 mL premix  500 mg Intravenous Q12H Eugenie Norrie, NP 400 mL/hr at 01/17/21 0826 500 mg at 01/17/21 0826  . levofloxacin (LEVAQUIN) IVPB 750 mg  750 mg Intravenous Q24H Arbie Cookey, MD   Stopped at 01/16/21 2215  . metroNIDAZOLE (FLAGYL) IVPB 500 mg  500 mg Intravenous Q8H Eugenie Norrie, NP 100 mL/hr at 01/17/21 0918 500 mg at 01/17/21 0918  . ondansetron (ZOFRAN) injection 4 mg  4 mg Intravenous Q6H PRN Eugenie Norrie, NP   4 mg at 01/16/21 0318  . polyethylene glycol (MIRALAX / GLYCOLAX) packet 17 g  17 g Oral Daily PRN Eugenie Norrie,  NP      . thiamine (B-1) injection 100 mg  100 mg Intravenous Q24H Eugenie Norrie, NP   100  mg at 01/17/21 1610    Musculoskeletal: Strength & Muscle Tone: decreased Gait & Station: Unknown I did not test him Patient leans: N/A  Psychiatric Specialty Exam: Physical Exam Vitals and nursing note reviewed.  Constitutional:      Appearance: He is well-developed and well-nourished.  HENT:     Head: Normocephalic and atraumatic.  Eyes:     Conjunctiva/sclera: Conjunctivae normal.     Pupils: Pupils are equal, round, and reactive to light.  Cardiovascular:     Heart sounds: Normal heart sounds.  Pulmonary:     Effort: Pulmonary effort is normal.  Abdominal:     Palpations: Abdomen is soft.  Musculoskeletal:        General: Normal range of motion.     Cervical back: Normal range of motion.  Skin:    General: Skin is warm and dry.  Neurological:     Mental Status: He is alert.  Psychiatric:        Attention and Perception: He is inattentive.        Mood and Affect: Affect is blunt.        Speech: Speech is tangential.        Behavior: Behavior is slowed.        Thought Content: Thought content is not delusional. Thought content does not include homicidal or suicidal ideation.        Cognition and Memory: Cognition is impaired. Memory is impaired.        Judgment: Judgment is impulsive.     Review of Systems  Constitutional: Negative.   HENT: Negative.   Eyes: Negative.   Respiratory: Negative.   Cardiovascular: Negative.   Gastrointestinal: Negative.   Musculoskeletal: Negative.   Skin: Negative.   Neurological: Negative.   Psychiatric/Behavioral: Negative.     Blood pressure (!) 147/94, pulse (!) 111, temperature (!) 100.4 F (38 C), resp. rate 19, height 6\' 1"  (1.854 m), weight 96.4 kg, SpO2 99 %.Body mass index is 28.04 kg/m.  General Appearance: Casual  Eye Contact:  Minimal  Speech:  Slow  Volume:  Decreased  Mood:  Euthymic  Affect:  Constricted  Thought Process:  Coherent  Orientation:  Full (Time, Place, and Person)  Thought Content:   Tangential  Suicidal Thoughts:  No  Homicidal Thoughts:  No  Memory:  Immediate;   Fair Recent;   Poor Remote;   Poor  Judgement:  Impaired  Insight:  Shallow  Psychomotor Activity:  Normal  Concentration:  Concentration: Poor  Recall:  Poor  Fund of Knowledge:  Poor  Language:  Poor  Akathisia:  No  Handed:  Right  AIMS (if indicated):     Assets:  Housing Resilience Social Support  ADL's:  Impaired  Cognition:  Impaired,  Mild  Sleep:        Treatment Plan Summary: Plan Patient currently denies any suicidal ideation.  No report of any suicidal ideation identified up until this point.  Well-known substance abuse problem which he has never properly engaged in the treatment of.  I doubt and do not think we have any proof of any suicidal intent at this time although clearly he is irresponsible in his drug use.  Most likely scenario I would think would possibly be consumption of unknown pills at home along with alcohol use and that he may have had a withdrawal  seizure prior to presentation.  Patient does not seem to be of very high cognitive function but he is alert and oriented.  He was educated about the fatal dangers of continued abuse of medication and the combination of medication with alcohol.  He was not placed under IVC on presentation and at this point does not meet criteria for the initiation of IVC.  Patient has been referred multiple times in the past to local mental health agencies for follow-up outpatient treatment and once again I strongly encouraged him to follow-up with [facility] for his substance abuse problems.  No indication to initiate any medication.  Case reviewed with ICU attending and nursing staff.  Disposition: Patient does not meet criteria for psychiatric inpatient admission. Supportive therapy provided about ongoing stressors. Discussed crisis plan, support from social network, calling 911, coming to the Emergency Department, and calling Suicide Hotline.  Mordecai Rasmussen, MD 01/17/2021 2:42 PM

## 2021-01-17 NOTE — Progress Notes (Signed)
CRITICAL CARE NOTE hypothermia, lactic acidosis, and acute encephalopathy after being found surrounded by large quantities of beer bottles and pill bottles with illegible labels   CC  follow up respiratory failure  SUBJECTIVE Awake and alert but mildly drowsy. Denies any complains No fever or chills. No sob/cough/sputum No n/v/d.no abd pain Unable to move RUE Ate well this am    SIGNIFICANT EVENTS    BP (!) 147/94   Pulse (!) 111   Temp (!) 100.4 F (38 C)   Resp 19   Ht 6\' 1"  (1.854 m)   Wt 96.4 kg   SpO2 99%   BMI 28.04 kg/m    REVIEW OF SYSTEMS    PHYSICAL EXAMINATION:  GENERAL:critically ill appearing, no resp distress HEAD: Normocephalic, atraumatic.  EYES: Pupils equal, round, reactive to light.  No scleral icterus.  MOUTH: Moist mucosal membrane. NECK: Supple. No thyromegaly. No nodules. No JVD.  PULMONARY:CTA CARDIOVASCULAR: S1 and S2. Regular rate and rhythm. No murmurs, rubs, or gallops.  GASTROINTESTINAL: Soft, nontender, -distended. No masses. Positive bowel sounds. No hepatosplenomegaly.  MUSCULOSKELETAL: No swelling, clubbing, or edema.  NEUROLOGIC: awake and alert ,follows commands Unable to move RUE shrugs shoulder. No other motor deficit  SKIN:intact,warm,dry  INTAKE/OUTPUT  Intake/Output Summary (Last 24 hours) at 01/17/2021 1134 Last data filed at 01/17/2021 0842 Gross per 24 hour  Intake 2927.97 ml  Output 1125 ml  Net 1802.97 ml    LABS  CBC Recent Labs  Lab 01/16/21 0132 01/16/21 0533 01/17/21 0755  WBC 14.9* 12.5* 8.5  HGB 15.5 15.9 15.4  HCT 45.4 46.2 44.8  PLT 270 271 238   Coag's No results for input(s): APTT, INR in the last 168 hours. BMET Recent Labs  Lab 01/16/21 0533 01/16/21 1516 01/17/21 0755  NA 137 140 139  K 4.7 4.5 4.3  CL 99 100 104  CO2 24 26 25   BUN 13 12 11   CREATININE 1.02 1.13 1.20  GLUCOSE 187* 195* 171*   Electrolytes Recent Labs  Lab 01/16/21 0533 01/16/21 1516 01/17/21 0755   CALCIUM 8.6* 8.4* 8.6*  MG 1.6*  --  1.9  PHOS 3.2  --  2.6   Sepsis Markers Recent Labs  Lab 01/16/21 0132 01/16/21 0514 01/16/21 0836 01/16/21 1516 01/16/21 1830 01/17/21 0755  LATICACIDVEN 6.8*   < > 2.9* 3.6* 3.1*  --   PROCALCITON 0.35  --   --   --   --  0.27   < > = values in this interval not displayed.   ABG No results for input(s): PHART, PCO2ART, PO2ART in the last 168 hours. Liver Enzymes Recent Labs  Lab 01/15/21 1859 01/16/21 0533  AST 85* 137*  ALT 98* 92*  ALKPHOS 100 89  BILITOT 0.6 0.8  ALBUMIN 4.7 3.8   Cardiac Enzymes No results for input(s): TROPONINI, PROBNP in the last 168 hours. Glucose Recent Labs  Lab 01/16/21 0418 01/16/21 0930 01/16/21 1155 01/16/21 1538 01/17/21 0015 01/17/21 0801  GLUCAP 156* 215* 187* 179* 200* 144*   CPK 9265 Pro cal 0.27  Recent Results (from the past 240 hour(s))  Urine Culture     Status: None   Collection Time: 01/15/21  9:05 PM   Specimen: Urine, Random  Result Value Ref Range Status   Specimen Description   Final    URINE, RANDOM Performed at [facility], [address] [place], [place] [address]    Special Requests   Final    NONE Performed at [facility], [address] [place], [place] [address]    Culture   Final    NO GROWTH Performed at [facility], [address] [place], [place] [address]    Report Status 01/17/2021 FINAL  Final  Culture, blood (single)     Status: None (Preliminary result)   Collection Time: 01/15/21 11:16 PM   Specimen: BLOOD  Result Value Ref Range Status   Specimen Description BLOOD LEFT ANTECUBITAL  Final   Special Requests   Final    BOTTLES DRAWN AEROBIC AND ANAEROBIC Blood Culture adequate volume   Culture   Final    NO GROWTH 2 DAYS Performed at [facility], [address], [place], [place] [address]    Report Status PENDING  Incomplete  Resp Panel by RT-PCR (Flu A&B, Covid)     Status: None   Collection  Time: 01/16/21  1:26 AM  Result Value Ref Range Status   SARS Coronavirus 2 by RT PCR NEGATIVE NEGATIVE Final    Comment: (NOTE) SARS-CoV-2 target nucleic acids are NOT DETECTED.  The SARS-CoV-2 RNA is generally detectable in upper respiratory specimens during the acute phase of infection. The lowest concentration of SARS-CoV-2 viral copies this assay can detect is 138 copies/mL. A negative result does not preclude SARS-Cov-2 infection and should not be used as the sole basis for treatment or other patient management decisions. A negative result may occur with  improper specimen collection/handling, submission of specimen other than nasopharyngeal swab, presence of viral mutation(s) within the areas targeted by this assay, and inadequate number of viral copies(<138 copies/mL). A negative result must be combined with clinical observations, patient history, and epidemiological information. The expected result is Negative.  Fact Sheet for Patients:  BloggerCourse.com  Fact Sheet for Healthcare Providers:  SeriousBroker.it  This test is no t yet approved or cleared by the [place] FDA and  has been authorized for detection and/or diagnosis of SARS-CoV-2 by FDA under an Emergency Use Authorization (EUA). This EUA will remain  in effect (meaning this test can be used) for the duration of the COVID-19 declaration under Section 564(b)(1) of the Act, 21 U.S.C.section 360bbb-3(b)(1), unless the authorization is terminated  or revoked sooner.       Influenza A by PCR NEGATIVE NEGATIVE Final   Influenza B by PCR NEGATIVE NEGATIVE Final    Comment: (NOTE) The Xpert Xpress SARS-CoV-2/FLU/RSV plus assay is intended as an aid in the diagnosis of influenza from Nasopharyngeal swab specimens and should not be used as a sole basis for treatment. Nasal washings and aspirates are unacceptable for Xpert Xpress  SARS-CoV-2/FLU/RSV testing.  Fact Sheet for Patients: BloggerCourse.com  Fact Sheet for Healthcare Providers: SeriousBroker.it  This test is not yet approved or cleared by the [place] FDA and has been authorized for detection and/or diagnosis of SARS-CoV-2 by FDA under an Emergency Use Authorization (EUA). This EUA will remain in effect (meaning this test can be used) for the duration of the COVID-19 declaration under Section 564(b)(1) of the Act, 21 U.S.C. section 360bbb-3(b)(1), unless the authorization is terminated or revoked.  Performed at [facility], [address] [place], [place] [address]   MRSA PCR Screening     Status: None   Collection Time: 01/16/21  3:22 AM   Specimen: Nasopharyngeal  Result Value Ref Range Status   MRSA by PCR NEGATIVE NEGATIVE Final    Comment:        The GeneXpert MRSA Assay (FDA approved for NASAL  specimens only), is one component of a comprehensive MRSA colonization surveillance program. It is not intended to diagnose MRSA infection nor to guide or monitor treatment for MRSA infections. Performed at [facility], [address] [place], [place] [address]     MEDICATIONS   Current Facility-Administered Medications:  .  Chlorhexidine Gluconate Cloth 2 % PADS 6 each, 6 each, Topical, Daily, Arbie Cookey, MD, 6 each at 01/16/21 1237 .  docusate sodium (COLACE) capsule 100 mg, 100 mg, Oral, BID PRN, Eugenie Norrie, NP .  folic acid injection 1 mg, 1 mg, Intravenous, Daily, Eugenie Norrie, NP, 1 mg at 01/17/21 6759 .  hydrALAZINE (APRESOLINE) injection 10-20 mg, 10-20 mg, Intravenous, Q4H PRN, Eugenie Norrie, NP, 20 mg at 01/16/21 1945 .  insulin aspart (novoLOG) injection 0-15 Units, 0-15 Units, Subcutaneous, TID WC, Arbie Cookey, MD, 2 Units at 01/17/21 [id number] .  lactated ringers infusion, , Intravenous, Continuous, Blakeney, Neldon Newport, NP, Last Rate: 100  mL/hr at 01/17/21 1118, New Bag at 01/17/21 1118 .  levETIRAcetam (KEPPRA) IVPB 500 mg/100 mL premix, 500 mg, Intravenous, Q12H, Blakeney, Neldon Newport, NP, Last Rate: 400 mL/hr at 01/17/21 0826, 500 mg at 01/17/21 0826 .  levofloxacin (LEVAQUIN) IVPB 750 mg, 750 mg, Intravenous, Q24H, Arbie Cookey, MD, Stopped at 01/16/21 2215 .  metroNIDAZOLE (FLAGYL) IVPB 500 mg, 500 mg, Intravenous, Q8H, Blakeney, Neldon Newport, NP, Last Rate: 100 mL/hr at 01/17/21 0918, 500 mg at 01/17/21 0918 .  ondansetron (ZOFRAN) injection 4 mg, 4 mg, Intravenous, Q6H PRN, Eugenie Norrie, NP, 4 mg at 01/16/21 0318 .  polyethylene glycol (MIRALAX / GLYCOLAX) packet 17 g, 17 g, Oral, Daily PRN, Eugenie Norrie, NP .  thiamine (B-1) injection 100 mg, 100 mg, Intravenous, Q24H, Blakeney, Dana G, NP, 100 mg at 01/17/21 0827  eee1/19/22     Indwelling Urinary Catheter continued, requirement due to   Reason to continue Indwelling Urinary Catheter for strict Intake/Output monitoring for hemodynamic instability   Central Line continued, requirement due to   Reason to continue [employer] Monitoring of central venous pressure or other hemodynamic parameters   Ventilator continued, requirement due to, resp failure    Ventilator Sedation RASS 0 to -2    EEG 01/16/21 Continuous slow, generalized  IMPRESSION: This technically difficult study is suggestive of moderate to severe diffuse encephalopathy, nonspecific etiology. No seizures or epileptiform discharges were seen throughout the recording.   ASSESSMENT AND PLAN SYNOPSIS  Acute toxic encephalopathy secondary to alcohol intoxication, suspected drug overdose and possible sepsis  Possible postictal state  Hx: Seizure activity current medication noncompliance (01/15/21: valproic acid level <10) Cont  iv keppra, thiamine, and folic acid  EEG showed slowing without any sig abnormality/seizure activity Cont 1:1 sitter Urine drug screen + for cocaine,benzodiazapines and  Tricyclics  Rhabdomyolysis without renal failure, cont IVF, follow CPK, better   RUE weakness/neck swelling  likely related to nerve compression from being found on the floor MH consulted    HTN  PRN hydralazine  Add norvasc 5 mg daily  Anion gap metabolic acidosis  Severe lactic acidosis Resolved  Possible sepsis although etiology unclear  Trend WBC and monitor fever curve  PCT initial  0.35, down to 0.27, nl wbc  Count, no fever , will stop abx in next 24 hrs if remains stable and c/s are negative   Type II diabetes mellitus  Hx: hypoglycemia  CBG's q4hrs   Best practice (evaluated daily)  Diet: diabetic Pain/Anxiety/Delirium protocol (if indicated):not  indicated VAP protocol (if indicated):not indicated DVT prophylaxis:subq lovenox GI prophylaxis:not indicated Glucose control:pt with hx of severe hypoglycemia will hold off on SSI until pt able to tolerate diet Mobility:bedrest Disposition:transfer to floor  PT/Ot consulted Goals of Care:  Last date of multidisciplinary goals of care discussion:N/A Family and staff present:N/A Summary of discussion:N/A Follow up goals of care discussion due:02-07-2021 Code Status:Full Code    Overall, patient is critically ill, prognosis is guarded.  Patient with Multiorgan failure and at high risk for cardiac arrest and death.   Arbie Cookey, MD  02/07/21 11:34 AM [facility]

## 2021-01-17 NOTE — Progress Notes (Signed)
Patient accepted to hospitalist service as an attending at 7 AM on 1/21.  60 year old male admitted with hypothermia, lactic acidosis and acute metabolic encephalopathy after being found surrounded by large quantities of beer bottles and pill bottles.  Now awake and alert per PCCM.  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  On IV Keppra and antibiotic with negative cultures thus far.  Can consider stopping antibiotics NT biotics in 24 hours if remains stable and negative cultures.

## 2021-01-17 NOTE — Progress Notes (Signed)
Pt grew increasingly agitated over the shift.  Pt states "I have a right to refuse treatment.  I just want to go home."  Pt refused physical therapy, refused insulin, refused blood pressure medication.  Pt informed that his blood sugar and blood pressure are elevated.  Pt still unable to mobilize right upper extremity.  Pt verbalized no plan of care once home, but continues to refuse any treatment while becoming increasingly aggressive.  Pt had demanded to speak with the doctor; the doctor educated the patient on the risks of leaving AMA, pt verbalized understanding of the risks, continues to demand to go home.  Pt family updated on patient's wishes, and they state they will arrive to pick him up.  AMA paperwork signed and witnessed.

## 2021-01-17 NOTE — Evaluation (Signed)
Occupational Therapy Evaluation Patient Details Name: Brandon Silva MRN: [id number] DOB: 08/12/1961 Today's Date: 01/17/2021    History of Present Illness Pt is a 60 y/o M with PMH of seizures and DM who was admitted with hypothermia, lactic acidosis, and acute encephalopathy after being found surrounded by large quantities of beer bottles and pill bottles with illegible labels. Pt apparently with similar presentation 1/4-01/02/2021 requiring intubation and released after delierium cleared and psych consult. Of note: his mom reported in ED that she thinks he's been depressed since a break up approximately 3WA.   Clinical Impression   Pt seen for OT evaluation this date in setting of acute hospitalization d/t being found down and rhabdo and is currently on suicide precautions. Pt is questionable historian d/t some acute confusion, but reports he is INDEP at baseline with all I/ADLs including caring for animals which he perseverates on getting home to today. OT educates pt re: safety considerations, role of OT, and safe transfer technique. Pt with lack of insight into his current deficits and decreased safety awareness, but moderate reception of education provided. Pt requires MOD/MAX A +1 to perform STS from elevated ICU bed surface. Requires MOD/MAX A +2 for SPS from bed to chair with sitter who was present throughout session assisting for safety and line mgt as pt with poor cue following and motor planning. Pt currently requires MIN/MOD A For seated UB ADLs, MOD/MAX A For seated LB ADLs including donning socks, makes good effort using sit/lateral lean technique, but difficulty threading d/t R UE weakness and decreased dynamic sitting  balance. MOD/MAX A with arm in arm for STS and SPS from elevated surface. Pt will require continued skilled OT in acute setting and also anticipate need for f/u at STR to restore strength and safety with ADLs/ADL mobility. Of note: Pt's HR noted to be slightly elevated even  at rest from 108-114, slightly increases with activity to ~115-120bpm, but overall stays consistent and pt is not noted to be distressed. Does become dizzy with initial sup to sit and sit to stand, unable to take orthostatics as pt relying heavily on L UE as it is the only fxl UE at this time, and R UE too swollen.    Follow Up Recommendations  SNF    Equipment Recommendations  3 in 1 bedside commode;Tub/shower seat;Other (comment) (2ww)    Recommendations for Other Services       Precautions / Restrictions Precautions Precautions: Fall Restrictions Weight Bearing Restrictions: No      Mobility Bed Mobility Overal bed mobility: Needs Assistance Bed Mobility: Supine to Sit     Supine to sit: Min guard;Min assist;HOB elevated     General bed mobility comments: increased time, use of bed rails    Transfers Overall transfer level: Needs assistance Equipment used: 1 person hand held assist Transfers: Sit to/from [employer] Sit to Stand: Mod assist;Max assist;From elevated surface Stand pivot transfers: Mod assist;Max assist;From elevated surface;+2 safety/equipment       General transfer comment: sitter present in room assists for safety and line mgt for SPS from bed to chair. Of note: ICU bed surface is significantly higher than chair, anticiapte pt will require increased assist for chair transfer back to bed.    Balance Overall balance assessment: Needs assistance Sitting-balance support: Single extremity supported;Feet supported Sitting balance-Leahy Scale: Fair       Standing balance-Leahy Scale: Poor Standing balance comment: requires UE support including arm in arm under R UE d/t weakness and requires  external support of MOD/MAX A To sustain even just static standing.                           ADL either performed or assessed with clinical judgement   ADL                                         General ADL  Comments: requires MIN/MOD A For seated UB ADLs, MOD/MAX A For seated LB ADLs including donning socks, makes good effort using sit/lateral lean technique, but difficulty threading d/t R UE weakness and decreased dynamic sitting  balance. MOD/MAX A with arm in arm for STS and SPS from elevated surface.     Vision Patient Visual Report: No change from baseline       Perception     Praxis      Pertinent Vitals/Pain Pain Assessment: Faces Faces Pain Scale: Hurts little more Pain Location: back and R UE Pain Descriptors / Indicators: Numbness;Tingling Pain Intervention(s): Limited activity within patient's tolerance;Monitored during session;Repositioned (positioned pt in chair with 2 pillows supporting R UE to prevent subluxation and assist with edema mgt.)     Hand Dominance Right   Extremity/Trunk Assessment Upper Extremity Assessment Upper Extremity Assessment: Generalized weakness;RUE deficits/detail;LUE deficits/detail RUE Deficits / Details: shld MMT 1/5-able to shrug. Otherwise unable to perform AROM. Tolerates full range PROM. RUE Sensation: decreased light touch;decreased proprioception RUE Coordination: decreased fine motor;decreased gross motor LUE Deficits / Details: ROM WFL, MMT grossly 4/5   Lower Extremity Assessment Lower Extremity Assessment: Defer to PT evaluation;Generalized weakness       Communication Communication Communication: Other (comment) (some garbled speech potentially 2/2 AMS, but overall understandable, requires increased time.)   Cognition Arousal/Alertness: Awake/alert Behavior During Therapy: Impulsive Overall Cognitive Status: Impaired/Different from baseline Area of Impairment: Orientation;Attention;Memory;Following commands;Problem solving;Safety/judgement                 Orientation Level: Disoriented to;Place;Time;Situation (does state "hospital" but not which one, states date is December 22. Educated on R UE numbness and still asks  multiple times during session.) Current Attention Level: Sustained Memory: Decreased short-term memory Following Commands: Follows one step commands with increased time Safety/Judgement: Decreased awareness of safety;Decreased awareness of deficits (pt stating that he wants to go home today to help his animals despite requiring 2p assist to mobilize safely at this time.)   Problem Solving: Slow processing;Difficulty sequencing;Requires verbal cues;Requires tactile cues     General Comments  Pt's HR noted to be slightly elevated even at rest from 108-114, slightly increases with activity to ~115-120bpm, but overall stays consistent and pt is not noted to be distressed. Does become dizzy with initial sup to sit and sit to stand, unable to take orthostatics as pt relying heavily on L UE as it is the only fxl UE at this time, and R UE too swollen.    Exercises Other Exercises Other Exercises: OT facilitates ed re: safety considerations, role of OT, importance of OOB Activity, use of call light, safe transfer technique, elevating R UE, PROM he can perform on R UE With L UE. Pt with moderate reception, will require continued follow up.   Shoulder Instructions      Home Living Family/patient expects to be discharged to:: Private residence Living Arrangements: Alone Available Help at Discharge: Family;Available PRN/intermittently Type of Home: Mobile home (reports  he lives in an "abandonded trailer") Home Access: Stairs to enter Secretary/administrator of Steps: "a few small steps"   Home Layout: One level               Home Equipment: None   Additional Comments: pt reports his mom has a cane and walker that he could potentially use. Reports he bathes at his mom's house. When asked if he had running water and electricity, pt does not answer.      Prior Functioning/Environment Level of Independence: Independent        Comments: Pt is questionable historian 2/2 confusion. Pt reports  being completely INDEP with all aspects of self care and fxl mobility at baseline.        OT Problem List: Decreased strength;Decreased range of motion;Decreased activity tolerance;Impaired balance (sitting and/or standing);Decreased coordination;Decreased cognition;Decreased safety awareness;Decreased knowledge of use of DME or AE;Decreased knowledge of precautions;Cardiopulmonary status limiting activity;Impaired sensation;Impaired tone;Impaired UE functional use;Pain;Increased edema      OT Treatment/Interventions: Self-care/ADL training;DME and/or AE instruction;Therapeutic activities;Balance training;Therapeutic exercise;Energy conservation;Patient/family education;Manual therapy    OT Goals(Current goals can be found in the care plan section) Acute Rehab OT Goals Patient Stated Goal: to go home OT Goal Formulation: With patient Time For Goal Achievement: 01/31/21 Potential to Achieve Goals: Fair ADL Goals Pt Will Perform Grooming: with set-up;with supervision;sitting Pt Will Perform Upper Body Dressing: with min assist;sitting (with modified hemi-dressing technique PRN) Pt Will Perform Lower Body Dressing: with min assist;with mod assist;sitting/lateral leans Pt Will Transfer to Toilet: with min assist;stand pivot transfer;bedside commode Pt/caregiver will Perform Home Exercise Program: Increased ROM;Right Upper extremity;With minimal assist Additional ADL Goal #1: Pt will increase static sitting tolerance to ~6-7 mins unsupported and standing ~30sec-1 min to increase fxl activity tolerance  OT Frequency: Min 1X/week   Barriers to D/C: Decreased caregiver support;Inaccessible home environment  pt reports living in an abandonded trailer. States he lives alone and goes to his mom's house to bathe.       Co-evaluation              AM-PAC OT "6 Clicks" Daily Activity     Outcome Measure Help from another person eating meals?: A Little Help from another person taking care of  personal grooming?: A Lot Help from another person toileting, which includes using toliet, bedpan, or urinal?: A Lot Help from another person bathing (including washing, rinsing, drying)?: A Lot Help from another person to put on and taking off regular upper body clothing?: A Lot Help from another person to put on and taking off regular lower body clothing?: A Lot 6 Click Score: 13   End of Session Equipment Utilized During Treatment: Gait belt Nurse Communication: Mobility status;Other (comment) (notified of dizziness with mobilization, but unable to get reliable BP)  Activity Tolerance: Patient tolerated treatment well Patient left: in chair;with call bell/phone within reach;with nursing/sitter in room (sitter present)  OT Visit Diagnosis: Unsteadiness on feet (R26.81);Muscle weakness (generalized) (M62.81)                Time: 6629-4765 OT Time Calculation (min): 47 min Charges:  OT General Charges $OT Visit: 1 Visit OT Evaluation $OT Eval Moderate Complexity: 1 Mod OT Treatments $Self Care/Home Management : 23-37 mins $Therapeutic Activity: 8-22 mins  Rejeana Brock, MS, OTR/L ascom [phone] 01/17/21, 12:27 PM

## 2021-01-18 NOTE — Discharge Summary (Signed)
60 year old gentleman with history of seizures admitted on 01/15/2021 through the emergency room with hypothermia lactic acidosis and acute encephalopathy after being found surrounded by large quantities of beer bottles pill bottles with illegible labels.  His mother felt that he has been depressed as his girlfriend has left him few weeks ago.  Upon arrival in the ER he was able to protect his airway but remained unresponsive with occasional jerking movements.  His vitals were stable however his temperature was 92.6 F rectally blood pressure was 160 /70s and oxygen saturation in the 90s he had an elevated anion gap with metabolic lactic acidosis with a white cell count of 2.6.  Tylenol level salicylate level and valproic acid acid levels were unremarkable/normal however his alcohol was 104 venous ABG showed pH of 7.26 PCO2 45.  Chest x-ray unremarkable.  Negative for COVID 19 and influenza.  He was obtunded and drowsy during examination.  His neurological examination was was limited as he was unable to follow commands however he withdrew from painful stimuli.  Chest was clear to auscultation cardiovascular examination showed regular rate and rhythm with intact pulses abdomen was unremarkable extremities showed no edema or cyanosis.  His CT head was unremarkable His  admitting diagnoses were 1.  Acute encephalopathy related to substance abuse. 2.  Lactic acidosis. 3 rhabdomyolysis without renal failure. 4.  Possible sepsis. 5.  Hypothermia. 6.  Type 2 diabetes mellitus 7.  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  He was treated with IV fluids thiamine, broad-spectrum antibiotics heating blanket.  His condition gradually improved and he woke up the next day.  However it was noticed that he was unable to move his right arm.  He was noted to have supraclavicular swelling which was soft nontender and extended to his back of the neck.  A CT scan of the neck was done as per radiologist  showed Right-sided edema in the posterior triangle of the neck and supraclavicular region likely extending toward the axilla and right upper extremity about the subclavian vessels and brachial plexus. May reflect rhabdomyolysis and myositis given history.   It was thought that this was probably related to compression injury as he had been found on the ground likely had been laying down on the right side mostly. His urine tox was positive for cocaine benzodiazepines and tricyclic's.  His CPK improved with IV fluids.  His cultures remain negative.  His lactic acidosis resolved.  He remained very weak however was able to transfer to the chair.  On 01/17/2021 he felt that he needed to go home as he had to take care of his chickens and dogs.  Mental health was consulted and they felt that he was not a candidate for involuntary commitment.  He signed AMA and left the hospital.  His mother was called and came to pick him up.

## 2021-01-20 LAB — CULTURE, BLOOD (SINGLE)
Culture: NO GROWTH
Special Requests: ADEQUATE

## 2021-02-12 ENCOUNTER — Encounter: Payer: Self-pay | Admitting: *Deleted

## 2021-02-12 ENCOUNTER — Other Ambulatory Visit: Payer: Self-pay

## 2021-02-12 ENCOUNTER — Observation Stay
Admission: EM | Admit: 2021-02-12 | Discharge: 2021-02-13 | Disposition: A | Discharge status: Home / Self Care | Payer: Medicaid Other | Attending: Family Medicine | Admitting: Family Medicine

## 2021-02-12 ENCOUNTER — Emergency Department: Payer: Medicaid Other

## 2021-02-12 DIAGNOSIS — Z79899 Other long term (current) drug therapy: Secondary | ICD-10-CM | POA: Insufficient documentation

## 2021-02-12 DIAGNOSIS — F17210 Nicotine dependence, cigarettes, uncomplicated: Secondary | ICD-10-CM | POA: Insufficient documentation

## 2021-02-12 DIAGNOSIS — T796XXA Traumatic ischemia of muscle, initial encounter: Secondary | ICD-10-CM | POA: Diagnosis present

## 2021-02-12 DIAGNOSIS — F10920 Alcohol use, unspecified with intoxication, uncomplicated: Secondary | ICD-10-CM

## 2021-02-12 DIAGNOSIS — G9340 Encephalopathy, unspecified: Secondary | ICD-10-CM

## 2021-02-12 DIAGNOSIS — R4182 Altered mental status, unspecified: Secondary | ICD-10-CM | POA: Diagnosis present

## 2021-02-12 DIAGNOSIS — E119 Type 2 diabetes mellitus without complications: Secondary | ICD-10-CM | POA: Diagnosis not present

## 2021-02-12 DIAGNOSIS — R569 Unspecified convulsions: Secondary | ICD-10-CM | POA: Diagnosis not present

## 2021-02-12 DIAGNOSIS — Z20822 Contact with and (suspected) exposure to covid-19: Secondary | ICD-10-CM | POA: Diagnosis not present

## 2021-02-12 DIAGNOSIS — Z789 Other specified health status: Secondary | ICD-10-CM

## 2021-02-12 DIAGNOSIS — Z794 Long term (current) use of insulin: Secondary | ICD-10-CM | POA: Diagnosis not present

## 2021-02-12 DIAGNOSIS — R443 Hallucinations, unspecified: Secondary | ICD-10-CM | POA: Insufficient documentation

## 2021-02-12 DIAGNOSIS — E1110 Type 2 diabetes mellitus with ketoacidosis without coma: Secondary | ICD-10-CM | POA: Diagnosis present

## 2021-02-12 DIAGNOSIS — G40909 Epilepsy, unspecified, not intractable, without status epilepticus: Secondary | ICD-10-CM

## 2021-02-12 DIAGNOSIS — F10129 Alcohol abuse with intoxication, unspecified: Principal | ICD-10-CM | POA: Insufficient documentation

## 2021-02-12 DIAGNOSIS — Z9114 Patient's other noncompliance with medication regimen: Secondary | ICD-10-CM

## 2021-02-12 DIAGNOSIS — Z7289 Other problems related to lifestyle: Secondary | ICD-10-CM

## 2021-02-12 HISTORY — DX: Dependence on respirator (ventilator) status: Z99.11

## 2021-02-12 LAB — CBC WITH DIFFERENTIAL/PLATELET
Abs Immature Granulocytes: 0.01 10*3/uL (ref 0.00–0.07)
Basophils Absolute: 0.1 10*3/uL (ref 0.0–0.1)
Basophils Relative: 1 %
Eosinophils Absolute: 0.1 10*3/uL (ref 0.0–0.5)
Eosinophils Relative: 1 %
HCT: 40.2 % (ref 39.0–52.0)
Hemoglobin: 13.8 g/dL (ref 13.0–17.0)
Immature Granulocytes: 0 %
Lymphocytes Relative: 30 %
Lymphs Abs: 2.3 10*3/uL (ref 0.7–4.0)
MCH: 30.9 pg (ref 26.0–34.0)
MCHC: 34.3 g/dL (ref 30.0–36.0)
MCV: 89.9 fL (ref 80.0–100.0)
Monocytes Absolute: 0.6 10*3/uL (ref 0.1–1.0)
Monocytes Relative: 8 %
Neutro Abs: 4.7 10*3/uL (ref 1.7–7.7)
Neutrophils Relative %: 60 %
Platelets: 213 10*3/uL (ref 150–400)
RBC: 4.47 MIL/uL (ref 4.22–5.81)
RDW: 11.9 % (ref 11.5–15.5)
WBC: 7.9 10*3/uL (ref 4.0–10.5)
nRBC: 0 % (ref 0.0–0.2)

## 2021-02-12 LAB — URINALYSIS, COMPLETE (UACMP) WITH MICROSCOPIC
Bacteria, UA: NONE SEEN
Bilirubin Urine: NEGATIVE
Glucose, UA: 50 mg/dL — AB
Ketones, ur: NEGATIVE mg/dL
Leukocytes,Ua: NEGATIVE
Nitrite: NEGATIVE
Protein, ur: NEGATIVE mg/dL
Specific Gravity, Urine: 1.002 — ABNORMAL LOW (ref 1.005–1.030)
Squamous Epithelial / HPF: NONE SEEN (ref 0–5)
pH: 5 (ref 5.0–8.0)

## 2021-02-12 LAB — URINE DRUG SCREEN, QUALITATIVE (ARMC ONLY)
Amphetamines, Ur Screen: NOT DETECTED
Barbiturates, Ur Screen: NOT DETECTED
Benzodiazepine, Ur Scrn: POSITIVE — AB
Cannabinoid 50 Ng, Ur ~~LOC~~: NOT DETECTED
Cocaine Metabolite,Ur ~~LOC~~: NOT DETECTED
MDMA (Ecstasy)Ur Screen: NOT DETECTED
Methadone Scn, Ur: NOT DETECTED
Opiate, Ur Screen: NOT DETECTED
Phencyclidine (PCP) Ur S: NOT DETECTED
Tricyclic, Ur Screen: NOT DETECTED

## 2021-02-12 LAB — LACTIC ACID, PLASMA
Lactic Acid, Venous: 2.9 mmol/L (ref 0.5–1.9)
Lactic Acid, Venous: 3.1 mmol/L (ref 0.5–1.9)
Lactic Acid, Venous: 3.2 mmol/L (ref 0.5–1.9)

## 2021-02-12 LAB — RESP PANEL BY RT-PCR (FLU A&B, COVID) ARPGX2
Influenza A by PCR: NEGATIVE
Influenza B by PCR: NEGATIVE
SARS Coronavirus 2 by RT PCR: NEGATIVE

## 2021-02-12 LAB — MAGNESIUM: Magnesium: 1.8 mg/dL (ref 1.7–2.4)

## 2021-02-12 LAB — CBG MONITORING, ED
Glucose-Capillary: 176 mg/dL — ABNORMAL HIGH (ref 70–99)
Glucose-Capillary: 202 mg/dL — ABNORMAL HIGH (ref 70–99)

## 2021-02-12 LAB — COMPREHENSIVE METABOLIC PANEL
ALT: 22 U/L (ref 0–44)
AST: 26 U/L (ref 15–41)
Albumin: 3.9 g/dL (ref 3.5–5.0)
Alkaline Phosphatase: 103 U/L (ref 38–126)
Anion gap: 10 (ref 5–15)
BUN: 11 mg/dL (ref 6–20)
CO2: 22 mmol/L (ref 22–32)
Calcium: 8.9 mg/dL (ref 8.9–10.3)
Chloride: 100 mmol/L (ref 98–111)
Creatinine, Ser: 0.84 mg/dL (ref 0.61–1.24)
GFR, Estimated: >60 mL/min (ref >60)
Glucose, Bld: 213 mg/dL — ABNORMAL HIGH (ref 70–99)
Potassium: 3.8 mmol/L (ref 3.5–5.1)
Sodium: 132 mmol/L — ABNORMAL LOW (ref 135–145)
Total Bilirubin: 0.8 mg/dL (ref 0.3–1.2)
Total Protein: 7.1 g/dL (ref 6.5–8.1)

## 2021-02-12 LAB — PHOSPHORUS: Phosphorus: 4.2 mg/dL (ref 2.5–4.6)

## 2021-02-12 LAB — LIPASE, BLOOD: Lipase: 69 U/L — ABNORMAL HIGH (ref 11–51)

## 2021-02-12 LAB — ETHANOL: Alcohol, Ethyl (B): 126 mg/dL — ABNORMAL HIGH (ref <10)

## 2021-02-12 LAB — VALPROIC ACID LEVEL: Valproic Acid Lvl: <10 ug/mL — ABNORMAL LOW (ref 50.0–100.0)

## 2021-02-12 LAB — CK: Total CK: 193 U/L (ref 49–397)

## 2021-02-12 MED ORDER — THIAMINE HCL 100 MG/ML IJ SOLN
500.0000 mg | Freq: Once | INTRAMUSCULAR | Status: AC
  Administered 2021-02-12: 500 mg via INTRAVENOUS
  Filled 2021-02-12: qty 6

## 2021-02-12 MED ORDER — POLYETHYLENE GLYCOL 3350 17 G PO PACK: 17.0000 g | PACK | Freq: Every day | ORAL | Status: DC | PRN

## 2021-02-12 MED ORDER — LACTATED RINGERS IV BOLUS
1000.0000 mL | Freq: Once | INTRAVENOUS | Status: AC
  Administered 2021-02-12: 1000 mL via INTRAVENOUS

## 2021-02-12 MED ORDER — INSULIN ASPART 100 UNIT/ML ~~LOC~~ SOLN
0.0000 [IU] | SUBCUTANEOUS | Status: DC
  Administered 2021-02-12: 5 [IU] via SUBCUTANEOUS
  Administered 2021-02-12: 3 [IU] via SUBCUTANEOUS
  Administered 2021-02-13: 11 [IU] via SUBCUTANEOUS
  Administered 2021-02-13: 15 [IU] via SUBCUTANEOUS
  Filled 2021-02-12 (×4): qty 1

## 2021-02-12 MED ORDER — DIVALPROEX SODIUM 500 MG PO DR TAB
500.0000 mg | DELAYED_RELEASE_TABLET | Freq: Two times a day (BID) | ORAL | Status: DC
  Administered 2021-02-13: 500 mg via ORAL
  Filled 2021-02-12 (×2): qty 1

## 2021-02-12 MED ORDER — ENOXAPARIN SODIUM 40 MG/0.4ML ~~LOC~~ SOLN
40.0000 mg | SUBCUTANEOUS | Status: DC
  Administered 2021-02-12: 40 mg via SUBCUTANEOUS
  Filled 2021-02-12: qty 0.4

## 2021-02-12 MED ORDER — ACETAMINOPHEN 325 MG PO TABS
650.0000 mg | ORAL_TABLET | Freq: Four times a day (QID) | ORAL | Status: DC | PRN
  Administered 2021-02-13 (×2): 650 mg via ORAL
  Filled 2021-02-12 (×2): qty 2

## 2021-02-12 MED ORDER — SODIUM CHLORIDE 0.9% FLUSH
3.0000 mL | Freq: Two times a day (BID) | INTRAVENOUS | Status: DC
  Administered 2021-02-12 – 2021-02-13 (×2): 3 mL via INTRAVENOUS

## 2021-02-12 MED ORDER — THIAMINE HCL 100 MG/ML IJ SOLN
100.0000 mg | Freq: Every day | INTRAMUSCULAR | Status: DC
  Administered 2021-02-12 – 2021-02-13 (×2): 100 mg via INTRAVENOUS
  Filled 2021-02-12 (×2): qty 2

## 2021-02-12 MED ORDER — ADULT MULTIVITAMIN W/MINERALS CH
1.0000 | ORAL_TABLET | Freq: Every day | ORAL | Status: DC
  Administered 2021-02-13: 1 via ORAL
  Filled 2021-02-12: qty 1

## 2021-02-12 MED ORDER — SODIUM CHLORIDE 0.9 % IV SOLN: 2000.0000 mg | Freq: Once | INTRAVENOUS | Status: DC

## 2021-02-12 MED ORDER — SODIUM CHLORIDE 0.9 % IV SOLN
2000.0000 mg | Freq: Once | INTRAVENOUS | Status: DC
  Filled 2021-02-12: qty 20

## 2021-02-12 MED ORDER — ACETAMINOPHEN 650 MG RE SUPP: 650.0000 mg | Freq: Four times a day (QID) | RECTAL | Status: DC | PRN

## 2021-02-12 MED ORDER — LEVETIRACETAM IN NACL 1000 MG/100ML IV SOLN
1000.0000 mg | Freq: Once | INTRAVENOUS | Status: AC
  Administered 2021-02-12: 1000 mg via INTRAVENOUS

## 2021-02-12 MED ORDER — LORAZEPAM 2 MG/ML IJ SOLN: 1.0000 mg | INTRAMUSCULAR | Status: DC | PRN

## 2021-02-12 MED ORDER — LEVETIRACETAM IN NACL 1000 MG/100ML IV SOLN
1000.0000 mg | Freq: Once | INTRAVENOUS | Status: AC
  Administered 2021-02-12: 1000 mg via INTRAVENOUS
  Filled 2021-02-12 (×3): qty 100

## 2021-02-12 MED ORDER — LORAZEPAM 1 MG PO TABS
1.0000 mg | ORAL_TABLET | ORAL | Status: DC | PRN
  Administered 2021-02-13: 2 mg via ORAL
  Filled 2021-02-12: qty 2

## 2021-02-12 MED ORDER — LACTATED RINGERS IV SOLN: INTRAVENOUS | Status: AC

## 2021-02-12 MED ORDER — LORAZEPAM 2 MG/ML IJ SOLN
2.0000 mg | Freq: Once | INTRAMUSCULAR | Status: AC
  Administered 2021-02-12: 2 mg via INTRAVENOUS
  Filled 2021-02-12: qty 1

## 2021-02-12 MED ORDER — FOLIC ACID 5 MG/ML IJ SOLN
1.0000 mg | Freq: Every day | INTRAMUSCULAR | Status: DC
  Administered 2021-02-13: 1 mg via INTRAVENOUS
  Filled 2021-02-12 (×2): qty 0.2

## 2021-02-12 MED ORDER — DROPERIDOL 2.5 MG/ML IJ SOLN
2.5000 mg | Freq: Once | INTRAMUSCULAR | Status: AC
  Administered 2021-02-12: 2.5 mg via INTRAVENOUS

## 2021-02-12 NOTE — ED Notes (Signed)
Pt sleeping  Iv fluids infusing.    Pt waiting on admission

## 2021-02-12 NOTE — ED Notes (Signed)
Pt brought in via ems from a camper that he lives in.  Pt hallucinating and is intoxicated on arrival  Iv in place.  meds given.  Er md at bedside.  Sinus on monitor.  Pt uncooperative  Security at bedside. Pt talking about people coming to kill him.

## 2021-02-12 NOTE — ED Notes (Signed)
Lab in with pt for cultures.  Pt more cooperative after meds.  Seizure pads in place.  nsr on monitor

## 2021-02-12 NOTE — ED Notes (Signed)
Lab in with pt now °

## 2021-02-12 NOTE — ED Triage Notes (Signed)
Pt brought in via ems from his camper with alcohol intoxication and seizures.  Ems report giving pt 10 mg versed iv on the scene.  On arrival, pt moving about on stretcher, not following commands.  siderails up x 2.

## 2021-02-12 NOTE — H&P (Signed)
History and Physical   Ervin Hensley UTM:[id number] DOB: [birth year] DOA: 02/12/2021  PCP: [facility]   Patient coming from: Home  Chief Complaint: Acute encephalopathy, seizure  HPI: Brandon Silva is a 60 y.o. male with medical history significant of alcohol use, cocaine use, seizure, rhabdo, diabetes who presents following a possible seizure and with alcohol intoxication.  Patient unable to significantly participate in HPI due to his altered mental status.  History obtained with the assistance of chart review.  He was brought in by EMS from his camper with alcohol intoxication and concern for seizure.  Per reports he had two seizure-like episodes but he may have been able to have purposeful movements during at least one of them.  He did receive Versed x2 by EMS and was calmed.   On arrival to the ED he remained altered and was apparently hallucinating with concerns for people making fun of him in the room.  He required additional sedation in ED to help calm him.  Of note, he was admitted to the ICU from 1/18-1/20 due to hypothermia, lactic acidosis, encephalopathy due to ingestion of alcohol and pills.  He was treated with IV fluids antibiotics and rewarming with improvement.  He is also noted to have right arm weakness at that time was found to have right neck edema with rhabdomyolysis and compression likely leading to this.  He subsequently left AMA from that visit.  ED Course: Vital signs in ED significant for heart rate in the 100s initially improved to 70s to 80s.  Blood pressure in the 120s to 140 systolic.  Vital signs otherwise stable.  Lab work-up showed BMP with sodium 132, glucose 213.  LFTs within normal limits.  CBC within normal's.  CK normal, lipase mildly elevated 69 which is stable for him.  Lactic acid elevated at 2.9 and then 3.1 although these were within an hour of each other and before he received any IV fluids.  UDS was positive for benzos which he received  by EMS.  Urinalysis showed only glucose and small hemoglobin.  EtOH level was positive at 126.  Blood cultures, valproic acid level, respiratory panel for flu and COVID pending.  Imaging studies included chest x-ray which showed minimal basilar atelectasis versus airspace disease and CT head which showed no acute abnormality.  Patient was given Keppra load, Ativan, dronabinol, 1 L fluids in the ED.  Review of Systems: Patient unable to participate in HPI due to encephalopathy and sedated state. Past Medical History:  Diagnosis Date  . Alcohol abuse   . Diabetes mellitus without complication (HCC)   . Seizures (HCC)   . Ventilator dependence (HCC)     No past surgical history on file.  Social History  reports that he has been smoking cigarettes. He has never used smokeless tobacco. He reports current alcohol use. He reports that he does not use drugs.  Allergies  Allergen Reactions  . Penicillins Rash    Has patient had a PCN reaction causing immediate rash, facial/tongue/throat swelling, SOB or lightheadedness with hypotension: Yes Has patient had a PCN reaction causing severe rash involving mucus membranes or skin necrosis: No Has patient had a PCN reaction that required hospitalization: No Has patient had a PCN reaction occurring within the last 10 years: No If all of the above answers are "NO", then may proceed with Cephalosporin use.     Family History  Problem Relation Age of Onset  . Diabetes Other    Prior to Admission medications  Medication Sig Start Date End Date Taking? Authorizing Provider  acetaminophen (TYLENOL) 325 MG tablet Take 650 mg by mouth every 6 (six) hours as needed.    [provider]  allopurinol (ZYLOPRIM) 100 MG tablet Take 100 mg by mouth daily.    [provider]  citalopram (CELEXA) 20 MG tablet Take 40 mg by mouth daily. 08/22/20   [provider]  divalproex (DEPAKOTE) 500 MG DR tablet Take 500 mg by mouth 2 (two) times  daily. 05/11/18   [provider]  insulin NPH-regular Human (70-30) 100 UNIT/ML injection Inject 15-35 Units into the skin 2 (two) times daily. 05/11/18   [provider]  LORazepam (ATIVAN) 0.5 MG tablet Take 0.5 mg by mouth every 8 (eight) hours.    [provider]  meloxicam (MOBIC) 15 MG tablet Take 15 mg by mouth daily.    [provider]    Physical Exam: Vitals:   02/12/21 1930 02/12/21 1945 02/12/21 2000 02/12/21 2030  BP:    (!) 142/84  Pulse:  87 82 75  Resp: 16 14 14 13   Temp:      TempSrc:      SpO2:  94% 99% 100%  Weight:      Height:       Physical Exam Constitutional:      General: He is not in acute distress.    Appearance: Normal appearance.  HENT:     Head: Normocephalic and atraumatic.     Mouth/Throat:     Mouth: Mucous membranes are moist.     Pharynx: Oropharynx is clear.  Eyes:     Extraocular Movements: Extraocular movements intact.     Pupils: Pupils are equal, round, and reactive to light.  Cardiovascular:     Rate and Rhythm: Normal rate and regular rhythm.     Pulses: Normal pulses.     Heart sounds: Normal heart sounds.  Pulmonary:     Effort: Pulmonary effort is normal. No respiratory distress.     Breath sounds: Normal breath sounds.  Abdominal:     General: Bowel sounds are normal. There is no distension.     Palpations: Abdomen is soft.     Tenderness: There is no abdominal tenderness.  Musculoskeletal:        General: No swelling or deformity.  Skin:    General: Skin is warm and dry.  Neurological:     Comments: Sedated and drowsy when seen.  He reportedly continues to have right upper extremity weakness.    Labs on Admission: I have personally reviewed following labs and imaging studies  CBC: Recent Labs  Lab 02/12/21 1856  WBC 7.9  NEUTROABS 4.7  HGB 13.8  HCT 40.2  MCV 89.9  PLT 213    Basic Metabolic Panel: Recent Labs  Lab 02/12/21 1856  NA 132*  K 3.8  CL 100  CO2 22   GLUCOSE 213*  BUN 11  CREATININE 0.84  CALCIUM 8.9    GFR: Estimated Creatinine Clearance: 118.6 mL/min (by C-G formula based on SCr of 0.84 mg/dL).  Liver Function Tests: Recent Labs  Lab 02/12/21 1856  AST 26  ALT 22  ALKPHOS 103  BILITOT 0.8  PROT 7.1  ALBUMIN 3.9    Urine analysis:    Component Value Date/Time   COLORURINE COLORLESS (A) 02/12/2021 1852   APPEARANCEUR CLEAR (A) 02/12/2021 1852   APPEARANCEUR Clear 01/04/2015 1255   LABSPEC 1.002 (L) 02/12/2021 1852   LABSPEC 1.016 01/04/2015 1255  PHURINE 5.0 02/12/2021 1852   GLUCOSEU 50 (A) 02/12/2021 1852   GLUCOSEU Negative 01/04/2015 1255   HGBUR SMALL (A) 02/12/2021 1852   BILIRUBINUR NEGATIVE 02/12/2021 1852   BILIRUBINUR Negative 01/04/2015 1255   KETONESUR NEGATIVE 02/12/2021 1852   PROTEINUR NEGATIVE 02/12/2021 1852   NITRITE NEGATIVE 02/12/2021 1852   LEUKOCYTESUR NEGATIVE 02/12/2021 1852   LEUKOCYTESUR Negative 01/04/2015 1255    Radiological Exams on Admission: CT Head Wo Contrast  Result Date: 02/12/2021 CLINICAL DATA:  Altered mental status. EXAM: CT HEAD WITHOUT CONTRAST TECHNIQUE: Contiguous axial images were obtained from the base of the skull through the vertex without intravenous contrast. COMPARISON:  CT head dated 01/15/2021. FINDINGS: Brain: No evidence of acute infarction, hemorrhage, hydrocephalus, extra-axial collection or mass lesion/mass effect. Vascular: No hyperdense vessel or unexpected calcification. Skull: Normal. Negative for fracture or focal lesion. Sinuses/Orbits: Bilateral exophthalmos is redemonstrated. Other: None. IMPRESSION: 1. No acute intracranial process. Electronically Signed   By: Romona Curls M.D.   On: 02/12/2021 20:24   DG Chest Portable 1 View  Result Date: 02/12/2021 CLINICAL DATA:  Altered mental status EXAM: PORTABLE CHEST 1 VIEW COMPARISON:  Chest radiograph dated 01/15/2021. FINDINGS: The heart size and mediastinal contours are within normal limits. There  is minimal bibasilar atelectasis/airspace disease. There is no pleural effusion or pneumothorax. The visualized skeletal structures are unremarkable. IMPRESSION: Minimal bibasilar atelectasis/airspace disease. Electronically Signed   By: Romona Curls M.D.   On: 02/12/2021 20:22   EKG: Independently reviewed.  Sinus tachycardia at 105 bpm.  Apparent supraventricular bigeminy.  T wave flattening in inferior leads.  Assessment/Plan Principal Problem:   Seizure (HCC) Active Problems:   Seizures (HCC)   Acute encephalopathy   Uncontrolled type 2 diabetes mellitus with ketoacidosis without coma, without long-term current use of insulin (HCC)   Non compliance w medication regimen   Traumatic rhabdomyolysis (HCC)   Alcohol use   Seizure disorder (HCC)   Hallucination  Seizure Hallucinations Alcohol use and intoxication Acute encephalopathy > Patient presents via EMS for alcohol intoxication and possible seizure on scene > It is possible that this was a pseudoseizure and may be psychogenic in nature due to reports of purposeful movements during at least one of the seizures and hallucinations in the ED.  He however does have a history of seizure and nonadherence to medications. > Also on the differential is withdrawal causing hallucination and seizure, however this is unlikely given ethanol level was positive in the ED. > Keppra load given in the ED as well as as needed Ativan.  CK was normal. > Encephalopathy differential includes intoxication, seizure, psychogenic.  No sign of infection or electrolyte abnormality to contribute to his altered mental status. > Patient was IVCD in the ED due to his hallucinations and wanting to leave > Lactic acid elevated x2 in the ED however these were both before he received IV fluids we will continue to trend. - As he has had a Keppra load will plan to continue home Depakote tomorrow - We will place him on CIWA with Ativan - Seizure precautions - IV fluids,  trend lactic acid - Thiamine, Folate, Multivitamin - Check Mg and Phos - N.p.o. - EEG  Diabetes > Glucose 213 in the ED - SSI for now  Hyponatremia > Mildly hyponatremic at 132 in the setting of alcohol use, will monitor response to IV fluids  Elevated lipase > Lipase 69 in ED this is chronically elevated we will continue to monitor  Right upper extremity weakness >  Per reports this has persisted from his previous admission.  At that time is believed to be due to nerve compression due to him having been found down and then with rhabdo.  He is also noted to have supraclavicular swelling in right-sided edema in the posterior triangle of his neck on the CT scan with findings consistent of rhabdo versus myositis > CK normal in ED here today.  DVT prophylaxis: Lovenox  Code Status:   Full  Family Communication: Attempted to contact patient's mother Guinevere Ferrari at [phone] however there was no answer. Disposition Plan:   Patient is from:  Home  Anticipated DC to:  Home  Anticipated DC date:  1 to 5 days  Anticipated DC barriers: None  Consults called:  None Admission status:  Observation, progressive   Severity of Illness: The appropriate patient status for this patient is OBSERVATION. Observation status is judged to be reasonable and necessary in order to provide the required intensity of service to ensure the patient's safety. The patient's presenting symptoms, physical exam findings, and initial radiographic and laboratory data in the context of their medical condition is felt to place them at decreased risk for further clinical deterioration. Furthermore, it is anticipated that the patient will be medically stable for discharge from the hospital within 2 midnights of admission. The following factors support the patient status of observation.   " The patient's presenting symptoms include altered mental status, seizure. " The physical exam findings include sedated when seen. " The  initial radiographic and laboratory data are significant for hyponatremia of 132, glucose 213, lipase chronically elevated at 69, lactic acid 2.9 and three-point 1 repeat.Synetta Fail MD [facility]  How to contact the [facility] Attending or Consulting provider 7A - 7P or covering provider during after hours 7P -7A, for this patient?   1. Check the care team in [facility] and look for a) attending/consulting [facility] provider listed and b) the [facility] team listed 2. Log into [email or web address] and use [place]'s universal password to access. If you do not have the password, please contact the hospital operator. 3. Locate the [facility] provider you are looking for under [facility] and page to a number that you can be directly reached. 4. If you still have difficulty reaching the provider, please page the [facility] (Director on Call) for the Hospitalists listed on amion for assistance.  02/12/2021, 9:27 PM

## 2021-02-12 NOTE — ED Notes (Signed)
Pt return from ct scan 

## 2021-02-12 NOTE — ED Notes (Signed)
fsbs 176

## 2021-02-12 NOTE — ED Provider Notes (Signed)
[facility] Emergency Department Provider Note   ____________________________________________   Event Date/Time   First MD Initiated Contact with Patient 02/12/21 1848     (approximate)  I have reviewed the triage vital signs and the nursing notes.   HISTORY  Chief Complaint Alcohol Intoxication    HPI Brandon Silva is a 60 y.o. male with past medical history of alcohol abuse, seizures, diabetes who presents to the ED for altered mental status.  History is limited due to patient being disoriented and actively hallucinating.  Per EMS, family called 911 due to concern for seizures.  When EMS arrived, patient was able to walk to the stretcher, but shortly afterwards had diffuse shaking activity.  He continued to shake but was able to move purposefully during the episode, actively fighting against EMS personnel.  He was given 5 mg of IM Versed with no change, subsequently given another 5 mg IM Versed with some calming effect.  Patient with another shaking episode on arrival and is intermittently confused, stating there are people making fun of him in the room.  EMS reports that patient noted to have swelling in his right arm with difficulty using that arm.        Past Medical History:  Diagnosis Date  . Alcohol abuse   . Diabetes mellitus without complication (HCC)   . Seizures (HCC)   . Ventilator dependence [facility])     Patient Active Problem List   Diagnosis Date Noted  . Alcohol use 01/17/2021  . Cocaine abuse (HCC) 01/17/2021  . Seizure disorder (HCC) 01/17/2021  . Non compliance w medication regimen 01/16/2021  . Traumatic rhabdomyolysis (HCC)   . Drug abuse (HCC)   . Recurrent seizures (HCC) 07/20/2020  . Seizures (HCC) 09/17/2016  . Hypocalcemia   . AKI (acute kidney injury) (HCC)   . ETOH abuse   . Uncontrolled type 2 diabetes mellitus with ketoacidosis without coma, without long-term current use of insulin (HCC)   . Seizure (HCC) 02/04/2016     No past surgical history on file.  Prior to Admission medications   Medication Sig Start Date End Date Taking? Authorizing Provider  acetaminophen (TYLENOL) 325 MG tablet Take 650 mg by mouth every 6 (six) hours as needed.    [provider]  allopurinol (ZYLOPRIM) 100 MG tablet Take 100 mg by mouth daily.    [provider]  citalopram (CELEXA) 20 MG tablet Take 40 mg by mouth daily. 08/22/20   [provider]  divalproex (DEPAKOTE) 500 MG DR tablet Take 500 mg by mouth 2 (two) times daily. 05/11/18   [provider]  insulin NPH-regular Human (70-30) 100 UNIT/ML injection Inject 15-35 Units into the skin 2 (two) times daily. 05/11/18   [provider]  LORazepam (ATIVAN) 0.5 MG tablet Take 0.5 mg by mouth every 8 (eight) hours.    [provider]  meloxicam (MOBIC) 15 MG tablet Take 15 mg by mouth daily.    [provider]    Allergies Penicillins  Family History  Problem Relation Age of Onset  . Diabetes Other     Social History Social History   Tobacco Use  . Smoking status: Current Every Day Smoker    Types: Cigarettes  . Smokeless tobacco: Never Used  Substance Use Topics  . Alcohol use: Yes  . Drug use: No    Review of Systems Unable to obtain secondary to altered mental status  ____________________________________________   PHYSICAL EXAM:  VITAL SIGNS: ED  Triage Vitals  Enc Vitals Group     BP      Pulse      Resp      Temp      Temp src      SpO2      Weight      Height      Head Circumference      Peak Flow      Pain Score      Pain Loc      Pain Edu?      Excl. in GC?     Constitutional: Awake and alert, agitated and not redirectable. Eyes: Conjunctivae are normal.  Pupils equal round and reactive to light bilaterally. Head: Atraumatic. Nose: No congestion/rhinnorhea. Mouth/Throat: Mucous membranes are moist. Neck: Normal ROM, no meningismus. Cardiovascular: Normal rate,  regular rhythm. Grossly normal heart sounds. Respiratory: Normal respiratory effort.  No retractions. Lungs CTAB. Gastrointestinal: Soft and nontender. No distention. Genitourinary: deferred Musculoskeletal: No lower extremity tenderness nor edema.  Diffuse edema noted distally to right wrist with no erythema, warmth, or fluctuance. Neurologic:  Normal speech and language.  Moving left upper extremity and bilateral lower extremities equally, decreased movement noted in right upper extremity. Skin:  Skin is warm, dry and intact. No rash noted. Psychiatric: Agitated and confused, actively hallucinating.  ____________________________________________   LABS (all labs ordered are listed, but only abnormal results are displayed)  Labs Reviewed  COMPREHENSIVE METABOLIC PANEL - Abnormal; Notable for the following components:      Result Value   Sodium 132 (*)    Glucose, Bld 213 (*)    All other components within normal limits  ETHANOL - Abnormal; Notable for the following components:   Alcohol, Ethyl (B) 126 (*)    All other components within normal limits  LIPASE, BLOOD - Abnormal; Notable for the following components:   Lipase 69 (*)    All other components within normal limits  URINE DRUG SCREEN, QUALITATIVE ([facility] ONLY) - Abnormal; Notable for the following components:   Benzodiazepine, Ur Scrn POSITIVE (*)    All other components within normal limits  URINALYSIS, COMPLETE (UACMP) WITH MICROSCOPIC - Abnormal; Notable for the following components:   Color, Urine COLORLESS (*)    APPearance CLEAR (*)    Specific Gravity, Urine 1.002 (*)    Glucose, UA 50 (*)    Hgb urine dipstick SMALL (*)    All other components within normal limits  LACTIC ACID, PLASMA - Abnormal; Notable for the following components:   Lactic Acid, Venous 2.9 (*)    All other components within normal limits  LACTIC ACID, PLASMA - Abnormal; Notable for the following components:   Lactic Acid, Venous 3.1 (*)     All other components within normal limits  CULTURE, BLOOD (ROUTINE X 2)  CULTURE, BLOOD (ROUTINE X 2)  RESP PANEL BY RT-PCR (FLU A&B, COVID) ARPGX2  CBC WITH DIFFERENTIAL/PLATELET  CK  VALPROIC ACID LEVEL   ____________________________________________  EKG  ED ECG REPORT I, Chesley Noon, the attending physician, personally viewed and interpreted this ECG.   Date: 02/12/2021  EKG Time: 18:50  Rate: 105  Rhythm: sinus tachycardia  Axis: Normal  Intervals:none  ST&T Change: None   PROCEDURES  Procedure(s) performed (including Critical Care):  Procedures   ____________________________________________   INITIAL IMPRESSION / ASSESSMENT AND PLAN / ED COURSE       60 year old male presents to the ED via EMS with concern for altered mental status and possible seizure.  Patient  again had a shaking episode shortly after arrival to the ED but there was no stiffening and it is unclear whether this represents true seizure.  After shaking episode, patient does appear to be actively hallucinating and I am concerned that he is in alcohol withdrawal with associated delirium tremens.  We will treat with 2 mg Ativan, also load with IV Keppra given concern for seizure activity.  He does have decreased movement in his right upper extremity, however this seems to have been an issue during his recent admission, where he had signs of rhabdo.  We will recheck CK level and hydrate with IV fluids.  We will also check CT head for intracranial hemorrhage or other acute process.  Patient continued to act aggressively towards staff and attempt to leave despite 2 mg of Ativan.  Decision was made to treat with 2.5 mg IV droperidol, after which patient is sleeping comfortably.  CT head is negative for acute process, labs remarkable for elevated lactic acid, which is likely related to possible seizure activity.  Patient with no further seizure activity following loading dose of Keppra.  Case discussed with  hospitalist for admission.      ____________________________________________   FINAL CLINICAL IMPRESSION(S) / ED DIAGNOSES  Final diagnoses:  Altered mental status, unspecified altered mental status type  Acute alcoholic intoxication without complication [facility])  Hallucinations     ED Discharge Orders    None       Note:  This document was prepared using Dragon voice recognition software and may include unintentional dictation errors.   Chesley Noon, MD 02/12/21 2052

## 2021-02-12 NOTE — ED Notes (Signed)
fsbs 202 

## 2021-02-12 NOTE — ED Notes (Signed)
Pt sleeping  Pt placed on 2 liters oxygen [place].

## 2021-02-13 DIAGNOSIS — R569 Unspecified convulsions: Secondary | ICD-10-CM | POA: Diagnosis not present

## 2021-02-13 LAB — COMPREHENSIVE METABOLIC PANEL
ALT: 26 U/L (ref 0–44)
AST: 32 U/L (ref 15–41)
Albumin: 3.8 g/dL (ref 3.5–5.0)
Alkaline Phosphatase: 96 U/L (ref 38–126)
Anion gap: 9 (ref 5–15)
BUN: 11 mg/dL (ref 6–20)
CO2: 25 mmol/L (ref 22–32)
Calcium: 9.2 mg/dL (ref 8.9–10.3)
Chloride: 102 mmol/L (ref 98–111)
Creatinine, Ser: 0.82 mg/dL (ref 0.61–1.24)
GFR, Estimated: >60 mL/min (ref >60)
Glucose, Bld: 225 mg/dL — ABNORMAL HIGH (ref 70–99)
Potassium: 3.7 mmol/L (ref 3.5–5.1)
Sodium: 136 mmol/L (ref 135–145)
Total Bilirubin: 1 mg/dL (ref 0.3–1.2)
Total Protein: 7.1 g/dL (ref 6.5–8.1)

## 2021-02-13 LAB — CBC
HCT: 42.2 % (ref 39.0–52.0)
Hemoglobin: 14.6 g/dL (ref 13.0–17.0)
MCH: 31.1 pg (ref 26.0–34.0)
MCHC: 34.6 g/dL (ref 30.0–36.0)
MCV: 89.8 fL (ref 80.0–100.0)
Platelets: 213 10*3/uL (ref 150–400)
RBC: 4.7 MIL/uL (ref 4.22–5.81)
RDW: 12.1 % (ref 11.5–15.5)
WBC: 7.5 10*3/uL (ref 4.0–10.5)
nRBC: 0 % (ref 0.0–0.2)

## 2021-02-13 LAB — BLOOD GAS, VENOUS
Acid-base deficit: 6.9 mmol/L — ABNORMAL HIGH (ref 0.0–2.0)
Bicarbonate: 20.2 mmol/L (ref 20.0–28.0)
O2 Saturation: 54.8 %
Patient temperature: 37
pCO2, Ven: 45 mmHg (ref 44.0–60.0)
pH, Ven: 7.26 (ref 7.250–7.430)
pO2, Ven: 34 mmHg (ref 32.0–45.0)

## 2021-02-13 LAB — GLUCOSE, CAPILLARY
Glucose-Capillary: 327 mg/dL — ABNORMAL HIGH (ref 70–99)
Glucose-Capillary: 345 mg/dL — ABNORMAL HIGH (ref 70–99)

## 2021-02-13 LAB — LACTIC ACID, PLASMA: Lactic Acid, Venous: 1.6 mmol/L (ref 0.5–1.9)

## 2021-02-13 MED ORDER — GABAPENTIN 100 MG PO CAPS: 100.0000 mg | ORAL_CAPSULE | Freq: Three times a day (TID) | ORAL | 0 refills | Status: AC

## 2021-02-13 MED ORDER — DIVALPROEX SODIUM 500 MG PO DR TAB: 500.0000 mg | DELAYED_RELEASE_TABLET | Freq: Two times a day (BID) | ORAL | 3 refills | Status: DC

## 2021-02-13 MED ORDER — INSULIN NPH ISOPHANE & REGULAR (70-30) 100 UNIT/ML ~~LOC~~ SUSP: 15.0000 [IU] | Freq: Two times a day (BID) | SUBCUTANEOUS | 11 refills | Status: AC

## 2021-02-13 NOTE — Progress Notes (Signed)
Patient discharged per orders. PIVs and tele removed from patient. AVS printed and reviewed with patient. Patient refused wheelchair and volunteer services. Walked down to [employer] by Education administrator.

## 2021-02-13 NOTE — ED Notes (Signed)
Pt sleeping. 

## 2021-02-13 NOTE — ED Notes (Signed)
Report messaged to Tressia Miners
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
## 2021-02-13 NOTE — Progress Notes (Signed)
Inpatient Diabetes Program Recommendations  AACE/ADA: New Consensus Statement on Inpatient Glycemic Control (2015)  Target Ranges:  Prepandial:   less than 140 mg/dL      Peak postprandial:   less than 180 mg/dL (1-2 hours)      Critically ill patients:  140 - 180 mg/dL   Lab Results  Component Value Date   GLUCAP 345 (H) 02/13/2021   HGBA1C 7.6 (H) 01/16/2021    Review of Glycemic Control  Diabetes history: DM2 Outpatient Diabetes medications: NPH 70/30 35 units in am and 15 units QPM Current orders for Inpatient glycemic control: Novolog 0-15 units Q4H.  HgbA1C - 7.6% Needs basal insulin CBGs 327, 345  Inpatient Diabetes Program Recommendations:     Add Levemir 10 units BID (2/3 home basal) Add Novolog 6 units TID with meals if eating > 50%. Change diet to CHO mod med   Continue to follow glucose trends.  Thank you. Ailene Ards, RD, LDN, CDE Inpatient Diabetes Coordinator [phone]

## 2021-02-13 NOTE — Plan of Care (Signed)

## 2021-02-17 LAB — CULTURE, BLOOD (ROUTINE X 2)
Culture: NO GROWTH
Culture: NO GROWTH
Special Requests: ADEQUATE
Special Requests: ADEQUATE

## 2021-02-23 ENCOUNTER — Other Ambulatory Visit: Payer: Self-pay

## 2021-02-23 ENCOUNTER — Emergency Department
Admission: EM | Admit: 2021-02-23 | Discharge: 2021-02-23 | Disposition: A | Discharge status: Home / Self Care | Payer: Medicaid Other | Attending: Emergency Medicine | Admitting: Emergency Medicine

## 2021-02-23 ENCOUNTER — Emergency Department: Payer: Medicaid Other

## 2021-02-23 DIAGNOSIS — E1110 Type 2 diabetes mellitus with ketoacidosis without coma: Secondary | ICD-10-CM | POA: Diagnosis not present

## 2021-02-23 DIAGNOSIS — W010XXA Fall on same level from slipping, tripping and stumbling without subsequent striking against object, initial encounter: Secondary | ICD-10-CM | POA: Insufficient documentation

## 2021-02-23 DIAGNOSIS — F17210 Nicotine dependence, cigarettes, uncomplicated: Secondary | ICD-10-CM | POA: Diagnosis not present

## 2021-02-23 DIAGNOSIS — I10 Essential (primary) hypertension: Secondary | ICD-10-CM | POA: Diagnosis not present

## 2021-02-23 DIAGNOSIS — Z794 Long term (current) use of insulin: Secondary | ICD-10-CM | POA: Insufficient documentation

## 2021-02-23 DIAGNOSIS — M25511 Pain in right shoulder: Secondary | ICD-10-CM | POA: Insufficient documentation

## 2021-02-23 DIAGNOSIS — R0781 Pleurodynia: Secondary | ICD-10-CM | POA: Diagnosis not present

## 2021-02-23 HISTORY — DX: Essential (primary) hypertension: I10

## 2021-02-23 MED ORDER — ACETAMINOPHEN 325 MG PO TABS
650.0000 mg | ORAL_TABLET | Freq: Once | ORAL | Status: AC
  Administered 2021-02-23: 650 mg via ORAL
  Filled 2021-02-23: qty 2

## 2021-02-23 MED ORDER — LIDOCAINE 4 % EX PTCH: 1.0000 | MEDICATED_PATCH | Freq: Two times a day (BID) | CUTANEOUS | 0 refills | Status: AC
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
## 2021-02-23 NOTE — ED Provider Notes (Signed)
[facility]-EMERGENCY DEPARTMENT  ____________________________________________  Time seen: Approximately 8:53 AM  I have reviewed the triage vital signs and the nursing notes.   HISTORY  Chief Complaint Rib Injury and Shoulder Pain   Historian Patient    HPI Brandon Silva is a 60 y.o. male with a history of alcohol use, presents to the emergency department after patient had a mechanical fall on a coffee table. Patient states that he recently had rib fractures and a dislocated shoulder. Patient has some swelling to his right hand but states that this has occurred since he dislocated his shoulder. Denies chest pain, chest tightness or abdominal pain. No new abrasions or lacerations.   Past Medical History:  Diagnosis Date  . Alcohol abuse   . Diabetes mellitus without complication (HCC)   . Hypertension   . Seizures (HCC)   . Ventilator dependence (HCC)      Immunizations up to date:  Yes.     Past Medical History:  Diagnosis Date  . Alcohol abuse   . Diabetes mellitus without complication (HCC)   . Hypertension   . Seizures (HCC)   . Ventilator dependence [facility])     Patient Active Problem List   Diagnosis Date Noted  . Hallucination 02/12/2021  . Alcohol use 01/17/2021  . Cocaine abuse (HCC) 01/17/2021  . Seizure disorder (HCC) 01/17/2021  . Non compliance w medication regimen 01/16/2021  . Traumatic rhabdomyolysis (HCC)   . Drug abuse (HCC)   . Recurrent seizures (HCC) 07/20/2020  . Seizures (HCC) 09/17/2016  . Hypocalcemia   . AKI (acute kidney injury) (HCC)   . Acute encephalopathy   . ETOH abuse   . Uncontrolled type 2 diabetes mellitus with ketoacidosis without coma, without long-term current use of insulin (HCC)   . Seizure (HCC) 02/04/2016    History reviewed. No pertinent surgical history.  Prior to Admission medications   Medication Sig Start Date End Date Taking? Authorizing Provider  Lidocaine (HM LIDOCAINE PATCH) 4 % PTCH Apply 1 patch  topically every 12 (twelve) hours. 02/23/21  Yes Pia Mau M, PA-C  acetaminophen (TYLENOL) 325 MG tablet Take 650 mg by mouth every 6 (six) hours as needed.    [provider]  divalproex (DEPAKOTE) 500 MG DR tablet Take 1 tablet (500 mg total) by mouth 2 (two) times daily. 02/13/21   Danford, Earl Lites, MD  gabapentin (NEURONTIN) 100 MG capsule Take 1 capsule (100 mg total) by mouth 3 (three) times daily. 02/13/21 03/15/21  Danford, Earl Lites, MD  insulin NPH-regular Human (70-30) 100 UNIT/ML injection Inject 15-35 Units into the skin 2 (two) times daily. 02/13/21   Danford, Earl Lites, MD    Allergies Penicillins  Family History  Problem Relation Age of Onset  . Diabetes Other     Social History Social History   Tobacco Use  . Smoking status: Current Every Day Smoker    Types: Cigarettes  . Smokeless tobacco: Never Used  Substance Use Topics  . Alcohol use: Yes  . Drug use: No     Review of Systems  Constitutional: No fever/chills Eyes:  No discharge ENT: No upper respiratory complaints. Respiratory: no cough. No SOB/ use of accessory muscles to breath Gastrointestinal:   No nausea, no vomiting.  No diarrhea.  No constipation. Musculoskeletal: Patient has right rib pain and right shoulder pain.  Skin: Negative for rash, abrasions, lacerations, ecchymosis.    ____________________________________________   PHYSICAL EXAM:  VITAL SIGNS: ED Triage Vitals  Enc Vitals Group  BP 02/23/21 0627 (!) 146/109     Pulse Rate 02/23/21 0627 100     Resp 02/23/21 0627 20     Temp --      Temp src --      SpO2 02/23/21 0626 100 %     Weight 02/23/21 0628 220 lb (99.8 kg)     Height 02/23/21 0628 6\' 4"  (1.93 m)     Head Circumference --      Peak Flow --      Pain Score 02/23/21 0628 6     Pain Loc --      Pain Edu? --      Excl. in GC? --      Constitutional: Alert and oriented. Well appearing and in no acute distress. Eyes: Conjunctivae are  normal. PERRL. EOMI. Head: Atraumatic. ENT:      Nose: No congestion/rhinnorhea.      Mouth/Throat: Mucous membranes are moist.  Neck: No stridor. Full range of motion. No midline C-spine tenderness to palpation. Cardiovascular: Normal rate, regular rhythm. Normal S1 and S2.  Good peripheral circulation. Respiratory: Normal respiratory effort without tachypnea or retractions. Lungs CTAB. Good air entry to the bases with no decreased or absent breath sounds Gastrointestinal: Bowel sounds x 4 quadrants. Soft and nontender to palpation. No guarding or rigidity. No distention. Musculoskeletal: Full range of motion to all extremities. No obvious deformities noted. Patient does have tenderness to palpation of the right lateral chest wall. Neurologic:  Normal for age. No gross focal neurologic deficits are appreciated.  Skin:  Skin is warm, dry and intact. No rash noted. Psychiatric: Mood and affect are normal for age. Speech and behavior are normal.   ____________________________________________   LABS (all labs ordered are listed, but only abnormal results are displayed)  Labs Reviewed - No data to display ____________________________________________  EKG   ____________________________________________  RADIOLOGY 02/25/21, personally viewed and evaluated these images (plain radiographs) as part of my medical decision making, as well as reviewing the written report by the radiologist.  DG Ribs Unilateral W/Chest Right  Result Date: 02/23/2021 CLINICAL DATA:  60 year old male with history of trauma from a fall onto a table. Evaluate for rib fracture. EXAM: RIGHT RIBS AND CHEST - 3+ VIEW COMPARISON:  Chest x-ray 02/12/2021. FINDINGS: Lung volumes are low. No consolidative airspace disease. No pleural effusions. No pneumothorax. No pulmonary nodule or mass noted. Pulmonary vasculature and the cardiomediastinal silhouette are within normal limits. Dedicated views of the right ribs  demonstrate possible nondisplaced fractures of the posterior aspect of the right eighth and ninth ribs. No other acute displaced fractures are confidently identified. IMPRESSION: 1. Possible acute nondisplaced fractures of the posterior right eighth and ninth ribs. Correlation for point tenderness in this region is recommended. 2. No pneumothorax or other signs of acute cardiopulmonary disease. Electronically Signed   By: 02/14/2021 M.D.   On: 02/23/2021 08:24   DG Shoulder Right  Result Date: 02/23/2021 CLINICAL DATA:  Possible rib fracture.  Right shoulder pain.  Fall. EXAM: RIGHT SHOULDER - 2+ VIEW COMPARISON:  12/26/2019 FINDINGS: Mild degenerative changes over the [facility] joint and glenohumeral joints. No evidence of acute fracture or dislocation IMPRESSION: No acute findings. Electronically Signed   By: [facility] M.D.   On: 02/23/2021 08:22    ____________________________________________    PROCEDURES  Procedure(s) performed:     Procedures     Medications  acetaminophen (TYLENOL) tablet 650 mg (has no administration in time range)  ____________________________________________   INITIAL IMPRESSION / ASSESSMENT AND PLAN / ED COURSE  Pertinent labs & imaging results that were available during my care of the patient were reviewed by me and considered in my medical decision making (see chart for details).     Assessment and plan Full 60 year old male presents to the emergency department after he had a mechanical fall.  Vital signs are reassuring at triage. X-ray of the right ribs indicates possible nondisplaced fractures of the eighth and ninth ribs. No acute bony abnormality of the right shoulder.  Patient was given Tylenol in the emergency department for pain.   He was discharged with lidocaine patches. Return precautions were given to return with new or worsening symptoms. All patient questions were  answered.      ____________________________________________  FINAL CLINICAL IMPRESSION(S) / ED DIAGNOSES  Final diagnoses:  Rib pain  Acute pain of right shoulder      NEW MEDICATIONS STARTED DURING THIS VISIT:  ED Discharge Orders         Ordered    Lidocaine (HM LIDOCAINE PATCH) 4 % PTCH  Every 12 hours        02/23/21 0851              This chart was dictated using voice recognition software/Dragon. Despite best efforts to proofread, errors can occur which can change the meaning. Any change was purely unintentional.     Orvil Feil, PA-C 02/23/21 5170    Minna Antis, MD 02/23/21 [phone]

## 2021-04-13 ENCOUNTER — Encounter: Payer: Self-pay | Admitting: Emergency Medicine

## 2021-04-13 ENCOUNTER — Other Ambulatory Visit: Payer: Self-pay

## 2021-04-13 DIAGNOSIS — E119 Type 2 diabetes mellitus without complications: Secondary | ICD-10-CM | POA: Diagnosis not present

## 2021-04-13 DIAGNOSIS — F329 Major depressive disorder, single episode, unspecified: Secondary | ICD-10-CM | POA: Diagnosis present

## 2021-04-13 DIAGNOSIS — F1414 Cocaine abuse with cocaine-induced mood disorder: Secondary | ICD-10-CM | POA: Diagnosis not present

## 2021-04-13 DIAGNOSIS — F10129 Alcohol abuse with intoxication, unspecified: Secondary | ICD-10-CM | POA: Diagnosis not present

## 2021-04-13 DIAGNOSIS — R440 Auditory hallucinations: Secondary | ICD-10-CM | POA: Diagnosis not present

## 2021-04-13 DIAGNOSIS — F17210 Nicotine dependence, cigarettes, uncomplicated: Secondary | ICD-10-CM | POA: Insufficient documentation

## 2021-04-13 DIAGNOSIS — I10 Essential (primary) hypertension: Secondary | ICD-10-CM | POA: Diagnosis not present

## 2021-04-13 DIAGNOSIS — Z794 Long term (current) use of insulin: Secondary | ICD-10-CM | POA: Diagnosis not present

## 2021-04-13 DIAGNOSIS — F339 Major depressive disorder, recurrent, unspecified: Secondary | ICD-10-CM | POA: Insufficient documentation

## 2021-04-13 DIAGNOSIS — Y907 Blood alcohol level of 200-239 mg/100 ml: Secondary | ICD-10-CM | POA: Insufficient documentation

## 2021-04-13 DIAGNOSIS — F1994 Other psychoactive substance use, unspecified with psychoactive substance-induced mood disorder: Secondary | ICD-10-CM | POA: Diagnosis not present

## 2021-04-13 LAB — CBC
HCT: 40.5 % (ref 39.0–52.0)
Hemoglobin: 14.8 g/dL (ref 13.0–17.0)
MCH: 31.6 pg (ref 26.0–34.0)
MCHC: 36.5 g/dL — ABNORMAL HIGH (ref 30.0–36.0)
MCV: 86.5 fL (ref 80.0–100.0)
Platelets: 244 10*3/uL (ref 150–400)
RBC: 4.68 MIL/uL (ref 4.22–5.81)
RDW: 11.7 % (ref 11.5–15.5)
WBC: 6.1 10*3/uL (ref 4.0–10.5)
nRBC: 0 % (ref 0.0–0.2)

## 2021-04-13 LAB — COMPREHENSIVE METABOLIC PANEL
ALT: 22 U/L (ref 0–44)
AST: 27 U/L (ref 15–41)
Albumin: 4.1 g/dL (ref 3.5–5.0)
Alkaline Phosphatase: 127 U/L — ABNORMAL HIGH (ref 38–126)
Anion gap: 11 (ref 5–15)
BUN: 7 mg/dL (ref 6–20)
CO2: 21 mmol/L — ABNORMAL LOW (ref 22–32)
Calcium: 8.8 mg/dL — ABNORMAL LOW (ref 8.9–10.3)
Chloride: 99 mmol/L (ref 98–111)
Creatinine, Ser: 0.93 mg/dL (ref 0.61–1.24)
GFR, Estimated: >60 mL/min (ref >60)
Glucose, Bld: 169 mg/dL — ABNORMAL HIGH (ref 70–99)
Potassium: 3.6 mmol/L (ref 3.5–5.1)
Sodium: 131 mmol/L — ABNORMAL LOW (ref 135–145)
Total Bilirubin: 0.7 mg/dL (ref 0.3–1.2)
Total Protein: 7.4 g/dL (ref 6.5–8.1)

## 2021-04-13 NOTE — ED Notes (Signed)
Pt refusing dress out and urine, reports [place] sheriff brought him to ED, reports self as "living on the streets"

## 2021-04-13 NOTE — ED Triage Notes (Signed)
Pt reports being overwhelmed with life and being real depressed, pt admit to regular ETOH but won't say how much or for any other illegal drugs - pt reports that he hears voices and and reports that "I have to be careful of what I say because you'll put it in that computer and then I'll be voluntarily committed", pt reports "I'm tired of hurting, I just want my life back"

## 2021-04-14 ENCOUNTER — Emergency Department
Admission: EM | Admit: 2021-04-14 | Discharge: 2021-04-14 | Disposition: A | Discharge status: Home / Self Care | Payer: Medicaid Other | Attending: Emergency Medicine | Admitting: Emergency Medicine

## 2021-04-14 DIAGNOSIS — Z789 Other specified health status: Secondary | ICD-10-CM

## 2021-04-14 DIAGNOSIS — F331 Major depressive disorder, recurrent, moderate: Secondary | ICD-10-CM

## 2021-04-14 DIAGNOSIS — F10920 Alcohol use, unspecified with intoxication, uncomplicated: Secondary | ICD-10-CM

## 2021-04-14 DIAGNOSIS — F1010 Alcohol abuse, uncomplicated: Secondary | ICD-10-CM

## 2021-04-14 DIAGNOSIS — F1410 Cocaine abuse, uncomplicated: Secondary | ICD-10-CM | POA: Diagnosis present

## 2021-04-14 DIAGNOSIS — Z7289 Other problems related to lifestyle: Secondary | ICD-10-CM

## 2021-04-14 DIAGNOSIS — F1994 Other psychoactive substance use, unspecified with psychoactive substance-induced mood disorder: Secondary | ICD-10-CM

## 2021-04-14 LAB — SALICYLATE LEVEL: Salicylate Lvl: <7 mg/dL — ABNORMAL LOW (ref 7.0–30.0)

## 2021-04-14 LAB — VALPROIC ACID LEVEL: Valproic Acid Lvl: <10 ug/mL — ABNORMAL LOW (ref 50.0–100.0)

## 2021-04-14 LAB — ACETAMINOPHEN LEVEL: Acetaminophen (Tylenol), Serum: 11 ug/mL (ref 10–30)

## 2021-04-14 LAB — ETHANOL: Alcohol, Ethyl (B): 227 mg/dL — ABNORMAL HIGH (ref <10)

## 2021-04-14 MED ORDER — GABAPENTIN 100 MG PO CAPS
100.0000 mg | ORAL_CAPSULE | Freq: Three times a day (TID) | ORAL | Status: DC
  Administered 2021-04-14: 100 mg via ORAL
  Filled 2021-04-14: qty 1

## 2021-04-14 MED ORDER — LORAZEPAM 2 MG PO TABS
2.0000 mg | ORAL_TABLET | Freq: Once | ORAL | Status: AC
  Administered 2021-04-14: 2 mg via ORAL
  Filled 2021-04-14: qty 1

## 2021-04-14 MED ORDER — DIVALPROEX SODIUM 500 MG PO DR TAB
500.0000 mg | DELAYED_RELEASE_TABLET | Freq: Two times a day (BID) | ORAL | Status: DC
  Administered 2021-04-14: 500 mg via ORAL
  Filled 2021-04-14: qty 1

## 2021-04-14 MED ORDER — INSULIN ASPART PROT & ASPART (70-30 MIX) 100 UNIT/ML ~~LOC~~ SUSP
15.0000 [IU] | Freq: Two times a day (BID) | SUBCUTANEOUS | Status: DC
  Administered 2021-04-14: 15 [IU] via SUBCUTANEOUS
  Filled 2021-04-14: qty 10

## 2021-04-14 MED ORDER — HYDROXYZINE PAMOATE 50 MG PO CAPS: 100.0000 mg | ORAL_CAPSULE | Freq: Three times a day (TID) | ORAL | 1 refills | Status: AC | PRN

## 2021-04-14 MED ORDER — LORAZEPAM 2 MG PO TABS: 0.0000 mg | ORAL_TABLET | Freq: Four times a day (QID) | ORAL | Status: DC

## 2021-04-14 MED ORDER — THIAMINE HCL 100 MG PO TABS
100.0000 mg | ORAL_TABLET | Freq: Every day | ORAL | Status: DC
  Administered 2021-04-14: 100 mg via ORAL
  Filled 2021-04-14: qty 1

## 2021-04-14 NOTE — ED Notes (Signed)
Called [facility] for consult spoke to Victorino Dike 1610 stated major delays

## 2021-04-14 NOTE — ED Notes (Signed)
Does not want to shower

## 2021-04-14 NOTE — ED Notes (Signed)
This RN outside and checked on patient sitting on bench.  Patient continues to refuse to come inside to wait for available room.

## 2021-04-14 NOTE — ED Notes (Signed)
Orders for VPA level on patient placed. Patient has refused Covid Test X 2 and his bedside psych consult. Patient will more than likely refuse a lab stick based on his becoming irritated from being awakened three times already. For safety, patient will not be awakened at this time as there is potential to further escalate patient. MD aware. Will pass this info off to oncoming shift.

## 2021-04-14 NOTE — BH Assessment (Signed)
This writer attempted to assess pt along with Rashaun D., NP; however, pt refused to participate in assessment . TTS to follow up.

## 2021-04-14 NOTE — ED Provider Notes (Signed)
[facility] Emergency Department Provider Note   ____________________________________________   Event Date/Time   First MD Initiated Contact with Patient 04/14/21 [phone]     (approximate)  I have reviewed the triage vital signs and the nursing notes.   HISTORY  Chief Complaint Psychiatric Evaluation    HPI Brandon Silva is a 60 y.o. male who presents to the ED from home with a chief complaint of depression.  Patient admits to regular EtOH and reports that he hears voices.  Appears paranoid and intoxicated.  Refusing to dress out per behavioral health protocol, refusing to wait in ED lobby, attempting to leave the premises.  I was asked to see the patient urgently to assess him for involuntary commitment.     Past Medical History:  Diagnosis Date  . Alcohol abuse   . Diabetes mellitus without complication (HCC)   . Hypertension   . Seizures (HCC)   . Ventilator dependence [facility])     Patient Active Problem List   Diagnosis Date Noted  . Hallucination 02/12/2021  . Alcohol use 01/17/2021  . Cocaine abuse (HCC) 01/17/2021  . Seizure disorder (HCC) 01/17/2021  . Non compliance w medication regimen 01/16/2021  . Traumatic rhabdomyolysis (HCC)   . Drug abuse (HCC)   . Recurrent seizures (HCC) 07/20/2020  . Seizures (HCC) 09/17/2016  . Hypocalcemia   . AKI (acute kidney injury) (HCC)   . Acute encephalopathy   . ETOH abuse   . Uncontrolled type 2 diabetes mellitus with ketoacidosis without coma, without long-term current use of insulin (HCC)   . Seizure (HCC) 02/04/2016    History reviewed. No pertinent surgical history.  Prior to Admission medications   Medication Sig Start Date End Date Taking? Authorizing Provider  acetaminophen (TYLENOL) 325 MG tablet Take 650 mg by mouth every 6 (six) hours as needed.    [provider]  divalproex (DEPAKOTE) 500 MG DR tablet Take 1 tablet (500 mg total) by mouth 2 (two) times daily. 02/13/21    Danford, Earl Lites, MD  gabapentin (NEURONTIN) 100 MG capsule Take 1 capsule (100 mg total) by mouth 3 (three) times daily. 02/13/21 03/15/21  Danford, Earl Lites, MD  insulin NPH-regular Human (70-30) 100 UNIT/ML injection Inject 15-35 Units into the skin 2 (two) times daily. 02/13/21   Danford, Earl Lites, MD  Lidocaine (HM LIDOCAINE PATCH) 4 % PTCH Apply 1 patch topically every 12 (twelve) hours. 02/23/21   Orvil Feil, PA-C    Allergies Penicillins  Family History  Problem Relation Age of Onset  . Diabetes Other     Social History Social History   Tobacco Use  . Smoking status: Current Every Day Smoker    Types: Cigarettes  . Smokeless tobacco: Never Used  Substance Use Topics  . Alcohol use: Yes  . Drug use: No    Review of Systems  Constitutional: No fever/chills Eyes: No visual changes. ENT: No sore throat. Cardiovascular: Denies chest pain. Respiratory: Denies shortness of breath. Gastrointestinal: No abdominal pain.  No nausea, no vomiting.  No diarrhea.  No constipation. Genitourinary: Negative for dysuria. Musculoskeletal: Negative for back pain. Skin: Negative for rash. Neurological: Negative for headaches, focal weakness or numbness. Psychiatric:  Positive for depression, paranoia and auditory hallucinations.  ____________________________________________   PHYSICAL EXAM:  VITAL SIGNS: ED Triage Vitals  Enc Vitals Group     BP 04/13/21 2257 (!) 163/92     Pulse Rate 04/13/21 2257 92     Resp 04/13/21 2257 20  Temp 04/13/21 2257 98.4 F (36.9 C)     Temp Source 04/13/21 2257 Oral     SpO2 04/13/21 2257 97 %     Weight 04/13/21 2300 220 lb 7.4 oz (100 kg)     Height 04/13/21 2300 6\' 4"  (1.93 m)     Head Circumference --      Peak Flow --      Pain Score 04/13/21 2259 0     Pain Loc --      Pain Edu? --      Excl. in GC? --     Constitutional: Alert and oriented. Well appearing and in no acute distress.  Tearful.  Not  speaking. Eyes: Conjunctivae are normal. PERRL. EOMI. Head: Atraumatic. Nose: No congestion/rhinnorhea. Mouth/Throat: Mucous membranes are moist.   Neck: No stridor.   Cardiovascular: Normal rate, regular rhythm. Grossly normal heart sounds.  Good peripheral circulation. Respiratory: Normal respiratory effort.  No retractions. Lungs CTAB. Gastrointestinal: Soft and nontender. No distention. No abdominal bruits. No CVA tenderness. Musculoskeletal: No lower extremity tenderness nor edema.  No joint effusions. Neurologic:  Normal speech and language per triage report. No gross focal neurologic deficits are appreciated. No gait instability. Skin:  Skin is warm, dry and intact. No rash noted. Psychiatric: Mood and affect are tearful.  Cooperative behavior.  ____________________________________________   LABS (all labs ordered are listed, but only abnormal results are displayed)  Labs Reviewed  COMPREHENSIVE METABOLIC PANEL - Abnormal; Notable for the following components:      Result Value   Sodium 131 (*)    CO2 21 (*)    Glucose, Bld 169 (*)    Calcium 8.8 (*)    Alkaline Phosphatase 127 (*)    All other components within normal limits  ETHANOL - Abnormal; Notable for the following components:   Alcohol, Ethyl (B) 227 (*)    All other components within normal limits  SALICYLATE LEVEL - Abnormal; Notable for the following components:   Salicylate Lvl <7.0 (*)    All other components within normal limits  CBC - Abnormal; Notable for the following components:   MCHC 36.5 (*)    All other components within normal limits  RESP PANEL BY RT-PCR (FLU A&B, COVID) ARPGX2  ACETAMINOPHEN LEVEL  URINE DRUG SCREEN, QUALITATIVE ([facility] ONLY)  VALPROIC ACID LEVEL   ____________________________________________  EKG  None ____________________________________________  RADIOLOGY I, Dillin Lofgren J, personally viewed and evaluated these images (plain radiographs) as part of my medical decision  making, as well as reviewing the written report by the radiologist.  ED MD interpretation: None  Official radiology report(s): No results found.  ____________________________________________   PROCEDURES  Procedure(s) performed (including Critical Care):  Procedures   ____________________________________________   INITIAL IMPRESSION / ASSESSMENT AND PLAN / ED COURSE  As part of my medical decision making, I reviewed the following data within the electronic MEDICAL RECORD NUMBER Nursing notes reviewed and incorporated, Labs reviewed, Old chart reviewed, A consult was requested and obtained from this/these consultant(s) Psychiatry and Notes from prior ED visits     61 year old male presenting with depression, paranoia and hearing voices.  Patient intoxicated and attempting to leave the facility.  He was placed under IVC for his safety. The patient has been placed in psychiatric observation due to the need to provide a safe environment for the patient while obtaining psychiatric consultation and evaluation, as well as ongoing medical and medication management to treat the patient's condition.  The patient has been placed under full  IVC at this time.   Clinical Course as of 04/14/21 4008  Wynelle Link Apr 14, 2021  0610 Remains in the ED pending psychiatric evaluation [JS]    Clinical Course User Index [JS] Irean Hong, MD     ____________________________________________   FINAL CLINICAL IMPRESSION(S) / ED DIAGNOSES  Final diagnoses:  Moderate episode of recurrent major depressive disorder (HCC)  Alcoholic intoxication without complication (HCC)  Alcohol abuse     ED Discharge Orders    None      *Please note:  Brandon Silva was evaluated in Emergency Department on 04/14/2021 for the symptoms described in the history of present illness. He was evaluated in the context of the global COVID-19 pandemic, which necessitated consideration that the patient might be at risk for infection  with the SARS-CoV-2 virus that causes COVID-19. Institutional protocols and algorithms that pertain to the evaluation of patients at risk for COVID-19 are in a state of rapid change based on information released by regulatory bodies including the CDC and federal and state organizations. These policies and algorithms were followed during the patient's care in the ED.  Some ED evaluations and interventions may be delayed as a result of limited staffing during and the pandemic.*   Note:  This document was prepared using Dragon voice recognition software and may include unintentional dictation errors.   Irean Hong, MD 04/14/21 [phone]

## 2021-04-14 NOTE — ED Notes (Signed)
Patient walking outside, this RN outside with patient and encouraged him to come back in but patient declined.  Charge nurse notified.

## 2021-04-14 NOTE — ED Notes (Signed)
Pt given belongings bag 1/1 back and allowed to change. Waiting on ride.

## 2021-04-14 NOTE — ED Notes (Signed)
Lab called to add on valproic acid level

## 2021-04-14 NOTE — ED Notes (Signed)
Patient has adamantly refused the ordered COVID test X 2. He was explained the importance and the reason the test had to be obtained, however, he still refused.

## 2021-04-14 NOTE — BH Assessment (Signed)
Patient refused to talk to this provider.  Stated that he just wanted to sleep.

## 2021-04-14 NOTE — ED Provider Notes (Signed)
-----------------------------------------   10:54 AM on 04/14/2021 -----------------------------------------  Patient evaluated by Dr. Toni Amend of psychiatry, does not represent an acute threat to himself or others and IVC was rescinded.  He is appropriate for discharge home with outpatient resources and will be prescribed Vistaril as needed for anxiety per Dr. Toni Amend.   Chesley Noon, MD 04/14/21 1055

## 2021-04-14 NOTE — ED Notes (Signed)
Cancelled soc  Dr. Toni Amend rescinded patient

## 2021-04-14 NOTE — ED Notes (Signed)
Dr.Clapacs at bedside  

## 2021-04-14 NOTE — BH Assessment (Signed)
Writer unable to complete consult because patient was seen by Psych, cleared and discharge prior to been seen by TTS/Writer.

## 2021-04-14 NOTE — Consult Note (Signed)
[facility] Face-to-Face Psychiatry Consult   Reason for Consult: Consult for 60 year old man came into the emergency room requesting help for being overwhelmed Referring Physician: Su Hoff Patient Identification: Brandon Silva MRN:  [id number] Principal Diagnosis: Substance induced mood disorder (HCC) Diagnosis:  Principal Problem:   Substance induced mood disorder (HCC) Active Problems:   Alcohol use   Cocaine abuse (HCC)   Total Time spent with patient: 1 hour  Subjective:   Brandon Silva is a 60 y.o. male patient admitted with "I got overwhelmed".  HPI: Patient seen chart reviewed.  Patient known from previous encounters.  60 year old man with longstanding problems with alcohol and other substance abuse came to the emergency room stating that he has felt overwhelmed.  When asked to discuss what has been overwhelming him he says it is "life in general".  Given a list of possible topics he says family have been getting on his nerves.  Patient admits that he is drinking heavily pretty much every day.  At least over a 6 pack a day.  Also intermittent use of other drugs including cocaine.  He denies any suicidal thought.  Denies homicidal thought.  Denies any hallucinations.  Not currently staying on his medicine or getting any appropriate medical follow-up.  He does have a place to live and gets a disability income.  Patient says he does not feel that he needs hospitalization and is requesting "something for my nerves".  Past Psychiatric History: Past history of multiple presentations to the hospital under almost identical circumstances.  Tends to be irritable and demanding when intoxicated but then symptoms fade away as soon as he sobers up.  No history of suicidal behavior.  He does have a history of seizure disorder unclear if this is entirely alcohol withdrawal related or not.  Minimal follow-up with outpatient psychiatric treatment.  Risk to Self:   Risk to Others:   Prior Inpatient Therapy:    Prior Outpatient Therapy:    Past Medical History:  Past Medical History:  Diagnosis Date  . Alcohol abuse   . Diabetes mellitus without complication (HCC)   . Hypertension   . Seizures (HCC)   . Ventilator dependence (HCC)    History reviewed. No pertinent surgical history. Family History:  Family History  Problem Relation Age of Onset  . Diabetes Other    Family Psychiatric  History: None reported Social History:  Social History   Substance and Sexual Activity  Alcohol Use Yes     Social History   Substance and Sexual Activity  Drug Use No    Social History   Socioeconomic History  . Marital status: Single    Spouse name: Not on file  . Number of children: Not on file  . Years of education: Not on file  . Highest education level: Not on file  Occupational History  . Not on file  Tobacco Use  . Smoking status: Current Every Day Smoker    Types: Cigarettes  . Smokeless tobacco: Never Used  Substance and Sexual Activity  . Alcohol use: Yes  . Drug use: No  . Sexual activity: Yes  Other Topics Concern  . Not on file  Social History Narrative  . Not on file   Social Determinants of Health   Financial Resource Strain: Not on file  Food Insecurity: Not on file  Transportation Needs: Not on file  Physical Activity: Not on file  Stress: Not on file  Social Connections: Not on file   Additional Social History:  Allergies:   Allergies  Allergen Reactions  . Penicillins Rash    Has patient had a PCN reaction causing immediate rash, facial/tongue/throat swelling, SOB or lightheadedness with hypotension: Yes Has patient had a PCN reaction causing severe rash involving mucus membranes or skin necrosis: No Has patient had a PCN reaction that required hospitalization: No Has patient had a PCN reaction occurring within the last 10 years: No If all of the above answers are "NO", then may proceed with Cephalosporin use.     Labs:  Results for orders  placed or performed during the hospital encounter of 04/14/21 (from the past 48 hour(s))  Comprehensive metabolic panel     Status: Abnormal   Collection Time: 04/13/21 11:19 PM  Result Value Ref Range   Sodium 131 (L) 135 - 145 mmol/L   Potassium 3.6 3.5 - 5.1 mmol/L   Chloride 99 98 - 111 mmol/L   CO2 21 (L) 22 - 32 mmol/L   Glucose, Bld 169 (H) 70 - 99 mg/dL    Comment: Glucose reference range applies only to samples taken after fasting for at least 8 hours.   BUN 7 6 - 20 mg/dL   Creatinine, Ser 5.46 0.61 - 1.24 mg/dL   Calcium 8.8 (L) 8.9 - 10.3 mg/dL   Total Protein 7.4 6.5 - 8.1 g/dL   Albumin 4.1 3.5 - 5.0 g/dL   AST 27 15 - 41 U/L   ALT 22 0 - 44 U/L   Alkaline Phosphatase 127 (H) 38 - 126 U/L   Total Bilirubin 0.7 0.3 - 1.2 mg/dL   GFR, Estimated >50 >35 mL/min    Comment: (NOTE) Calculated using the CKD-EPI Creatinine Equation (2021)    Anion gap 11 5 - 15    Comment: Performed at [facility], [address] [place], [place] [address]  Ethanol     Status: Abnormal   Collection Time: 04/13/21 11:19 PM  Result Value Ref Range   Alcohol, Ethyl (B) 227 (H) <10 mg/dL    Comment: (NOTE) Lowest detectable limit for serum alcohol is 10 mg/dL.  For medical purposes only. Performed at [facility], [address] [place], [place] [address]   Salicylate level     Status: Abnormal   Collection Time: 04/13/21 11:19 PM  Result Value Ref Range   Salicylate Lvl <7.0 (L) 7.0 - 30.0 mg/dL    Comment: Performed at [facility], [address] [place], [place] [address]  Acetaminophen level     Status: None   Collection Time: 04/13/21 11:19 PM  Result Value Ref Range   Acetaminophen (Tylenol), Serum 11 10 - 30 ug/mL    Comment: (NOTE) Therapeutic concentrations vary significantly. A range of 10-30 ug/mL  may be an effective concentration for many patients. However, some  are best treated at concentrations outside of this range. Acetaminophen  concentrations >150 ug/mL at 4 hours after ingestion  and >50 ug/mL at 12 hours after ingestion are often associated with  toxic reactions.  Performed at [facility], [address] [place], [place] [address]   cbc     Status: Abnormal   Collection Time: 04/13/21 11:19 PM  Result Value Ref Range   WBC 6.1 4.0 - 10.5 K/uL   RBC 4.68 4.22 - 5.81 MIL/uL   Hemoglobin 14.8 13.0 - 17.0 g/dL   HCT 67.5 91.6 - 38.4 %   MCV 86.5 80.0 - 100.0 fL   MCH 31.6 26.0 - 34.0 pg   MCHC  36.5 (H) 30.0 - 36.0 g/dL   RDW 32.5 49.8 - 26.4 %   Platelets 244 150 - 400 K/uL   nRBC 0.0 0.0 - 0.2 %    Comment: Performed at [facility], [address] [place], [place] [address]  Valproic acid level     Status: Abnormal   Collection Time: 04/13/21 11:19 PM  Result Value Ref Range   Valproic Acid Lvl <10 (L) 50.0 - 100.0 ug/mL    Comment: Performed at [facility], [address], [place], [place] [address]    Current Facility-Administered Medications  Medication Dose Route Frequency Provider Last Rate Last Admin  . divalproex (DEPAKOTE) DR tablet 500 mg  500 mg Oral BID Ward, Kristen N, DO   500 mg at 04/14/21 0914  . gabapentin (NEURONTIN) capsule 100 mg  100 mg Oral TID Ward, Kristen N, DO   100 mg at 04/14/21 0914  . insulin aspart protamine- aspart (NOVOLOG MIX 70/30) injection 15 Units  15 Units Subcutaneous BID WC Ward, Kristen N, DO   15 Units at 04/14/21 0914  . LORazepam (ATIVAN) tablet 0-4 mg  0-4 mg Oral Q6H Irean Hong, MD      . thiamine tablet 100 mg  100 mg Oral Daily Irean Hong, MD   100 mg at 04/14/21 8088   Current Outpatient Medications  Medication Sig Dispense Refill  . hydrOXYzine (VISTARIL) 50 MG capsule Take 2 capsules (100 mg total) by mouth every 8 (eight) hours as needed for anxiety. 60 capsule 1  . acetaminophen (TYLENOL) 325 MG tablet Take 650 mg by mouth every 6 (six) hours as needed.    . divalproex (DEPAKOTE) 500 MG DR tablet Take 1 tablet  (500 mg total) by mouth 2 (two) times daily. 60 tablet 3  . gabapentin (NEURONTIN) 100 MG capsule Take 1 capsule (100 mg total) by mouth 3 (three) times daily. 90 capsule 0  . insulin NPH-regular Human (70-30) 100 UNIT/ML injection Inject 15-35 Units into the skin 2 (two) times daily. 10 mL 11  . Lidocaine (HM LIDOCAINE PATCH) 4 % PTCH Apply 1 patch topically every 12 (twelve) hours. 10 patch 0    Musculoskeletal: Strength & Muscle Tone: within normal limits Gait & Station: normal Patient leans: N/A            Psychiatric Specialty Exam:  Presentation  General Appearance: No data recorded Eye Contact:No data recorded Speech:No data recorded Speech Volume:No data recorded Handedness:No data recorded  Mood and Affect  Mood:No data recorded Affect:No data recorded  Thought Process  Thought Processes:No data recorded Descriptions of Associations:No data recorded Orientation:No data recorded Thought Content:No data recorded History of Schizophrenia/Schizoaffective disorder:No data recorded Duration of Psychotic Symptoms:No data recorded Hallucinations:No data recorded Ideas of Reference:No data recorded Suicidal Thoughts:No data recorded Homicidal Thoughts:No data recorded  Sensorium  Memory:No data recorded Judgment:No data recorded Insight:No data recorded  Executive Functions  Concentration:No data recorded Attention Span:No data recorded Recall:No data recorded Fund of Knowledge:No data recorded Language:No data recorded  Psychomotor Activity  Psychomotor Activity:No data recorded  Assets  Assets:No data recorded  Sleep  Sleep:No data recorded  Physical Exam: Physical Exam Vitals and nursing note reviewed.  Constitutional:      Appearance: Normal appearance.  HENT:     Head: Normocephalic and atraumatic.     Mouth/Throat:     Pharynx: Oropharynx is clear.  Eyes:     Pupils: Pupils are equal, round, and reactive to light.  Cardiovascular:  Rate and Rhythm: Normal rate and regular rhythm.  Pulmonary:     Effort: Pulmonary effort is normal.     Breath sounds: Normal breath sounds.  Abdominal:     General: Abdomen is flat.     Palpations: Abdomen is soft.  Musculoskeletal:        General: Normal range of motion.  Skin:    General: Skin is warm and dry.  Neurological:     General: No focal deficit present.     Mental Status: He is alert. Mental status is at baseline.  Psychiatric:        Attention and Perception: Attention normal.        Mood and Affect: Mood normal.        Speech: Speech normal.        Behavior: Behavior is slowed.        Thought Content: Thought content normal. Thought content is not paranoid. Thought content does not include homicidal or suicidal ideation.        Cognition and Memory: Memory is impaired.        Judgment: Judgment is impulsive.    Review of Systems  Constitutional: Negative.   HENT: Negative.   Eyes: Negative.   Respiratory: Negative.   Cardiovascular: Negative.   Gastrointestinal: Negative.   Musculoskeletal: Negative.   Skin: Negative.   Neurological: Negative.   Psychiatric/Behavioral: Positive for memory loss and substance abuse. Negative for depression, hallucinations and suicidal ideas. The patient is nervous/anxious and has insomnia.    Blood pressure 129/74, pulse 89, temperature 98.3 F (36.8 C), temperature source Oral, resp. rate 19, height 6\' 4"  (1.93 m), weight 100 kg, SpO2 99 %. Body mass index is 26.84 kg/m.  Treatment Plan Summary: Medication management and Plan 60 year old man with alcohol and cocaine abuse comes to the hospital intoxicated somewhat grumpy irritable and demanding.  He refused to get lab evaluation done and was resistant to talking while still intoxicated because he wanted a nap.  Now that he is awake he is conversant with me and actually fairly pleasant.  Denies suicidal or homicidal ideation.  Patient was counseled about the obvious fact that  stopping drinking and using drugs would do the most possible benefit to helping him feel less "overwhelmed".  Also that continued drinking worsens all of his medical problems including diabetes and seizure disorder.  He will be given referral to the appropriate agencies doing outpatient treatment especially [facility] in [place].  He is requesting "something" for his nerves.  I agreed to give him a prescription for a modest amount of Vistaril that could be used as needed for anxiety while still emphasizing that the crucial thing is to stop drinking and using drugs.  Patient does not meet commitment criteria.  Discontinued IVC.  Case reviewed with emergency room physician and nursing staff.  Prescription provided.  Disposition: No evidence of imminent risk to self or others at present.   Patient does not meet criteria for psychiatric inpatient admission. Supportive therapy provided about ongoing stressors. Discussed crisis plan, support from social network, calling 911, coming to the Emergency Department, and calling Suicide Hotline.  Mordecai RasmussenJohn Glenda Spelman, MD 04/14/2021 10:54 AM

## 2021-04-14 NOTE — ED Notes (Signed)
Patient refused his bedside psych consult.

## 2021-09-06 ENCOUNTER — Other Ambulatory Visit: Payer: Self-pay

## 2021-09-06 ENCOUNTER — Emergency Department
Admission: EM | Admit: 2021-09-06 | Discharge: 2021-09-09 | Disposition: A | Discharge status: Home / Self Care | Payer: Medicaid Other | Attending: Emergency Medicine | Admitting: Emergency Medicine

## 2021-09-06 DIAGNOSIS — F332 Major depressive disorder, recurrent severe without psychotic features: Secondary | ICD-10-CM | POA: Diagnosis present

## 2021-09-06 DIAGNOSIS — R569 Unspecified convulsions: Secondary | ICD-10-CM

## 2021-09-06 DIAGNOSIS — F10920 Alcohol use, unspecified with intoxication, uncomplicated: Secondary | ICD-10-CM | POA: Diagnosis not present

## 2021-09-06 DIAGNOSIS — Y908 Blood alcohol level of 240 mg/100 ml or more: Secondary | ICD-10-CM | POA: Insufficient documentation

## 2021-09-06 DIAGNOSIS — G40909 Epilepsy, unspecified, not intractable, without status epilepticus: Secondary | ICD-10-CM | POA: Insufficient documentation

## 2021-09-06 DIAGNOSIS — E1110 Type 2 diabetes mellitus with ketoacidosis without coma: Secondary | ICD-10-CM | POA: Diagnosis not present

## 2021-09-06 DIAGNOSIS — F10929 Alcohol use, unspecified with intoxication, unspecified: Secondary | ICD-10-CM | POA: Diagnosis present

## 2021-09-06 DIAGNOSIS — Z79899 Other long term (current) drug therapy: Secondary | ICD-10-CM | POA: Insufficient documentation

## 2021-09-06 DIAGNOSIS — T1491XA Suicide attempt, initial encounter: Secondary | ICD-10-CM

## 2021-09-06 DIAGNOSIS — F1410 Cocaine abuse, uncomplicated: Secondary | ICD-10-CM | POA: Insufficient documentation

## 2021-09-06 DIAGNOSIS — Z20822 Contact with and (suspected) exposure to covid-19: Secondary | ICD-10-CM | POA: Diagnosis not present

## 2021-09-06 DIAGNOSIS — I10 Essential (primary) hypertension: Secondary | ICD-10-CM | POA: Insufficient documentation

## 2021-09-06 DIAGNOSIS — F17210 Nicotine dependence, cigarettes, uncomplicated: Secondary | ICD-10-CM | POA: Diagnosis not present

## 2021-09-06 DIAGNOSIS — Z794 Long term (current) use of insulin: Secondary | ICD-10-CM | POA: Diagnosis not present

## 2021-09-06 LAB — COMPREHENSIVE METABOLIC PANEL
ALT: 26 U/L (ref 0–44)
AST: 34 U/L (ref 15–41)
Albumin: 4.1 g/dL (ref 3.5–5.0)
Alkaline Phosphatase: 95 U/L (ref 38–126)
Anion gap: 11 (ref 5–15)
BUN: 11 mg/dL (ref 6–20)
CO2: 23 mmol/L (ref 22–32)
Calcium: 8.7 mg/dL — ABNORMAL LOW (ref 8.9–10.3)
Chloride: 99 mmol/L (ref 98–111)
Creatinine, Ser: 0.99 mg/dL (ref 0.61–1.24)
GFR, Estimated: >60 mL/min (ref >60)
Glucose, Bld: 123 mg/dL — ABNORMAL HIGH (ref 70–99)
Potassium: 3.7 mmol/L (ref 3.5–5.1)
Sodium: 133 mmol/L — ABNORMAL LOW (ref 135–145)
Total Bilirubin: 0.7 mg/dL (ref 0.3–1.2)
Total Protein: 7.3 g/dL (ref 6.5–8.1)

## 2021-09-06 LAB — CBC WITH DIFFERENTIAL/PLATELET
Abs Immature Granulocytes: 0.01 10*3/uL (ref 0.00–0.07)
Basophils Absolute: 0.1 10*3/uL (ref 0.0–0.1)
Basophils Relative: 1 %
Eosinophils Absolute: 0.2 10*3/uL (ref 0.0–0.5)
Eosinophils Relative: 3 %
HCT: 40.8 % (ref 39.0–52.0)
Hemoglobin: 14.6 g/dL (ref 13.0–17.0)
Immature Granulocytes: 0 %
Lymphocytes Relative: 36 %
Lymphs Abs: 2.2 10*3/uL (ref 0.7–4.0)
MCH: 32 pg (ref 26.0–34.0)
MCHC: 35.8 g/dL (ref 30.0–36.0)
MCV: 89.5 fL (ref 80.0–100.0)
Monocytes Absolute: 0.3 10*3/uL (ref 0.1–1.0)
Monocytes Relative: 5 %
Neutro Abs: 3.4 10*3/uL (ref 1.7–7.7)
Neutrophils Relative %: 55 %
Platelets: 218 10*3/uL (ref 150–400)
RBC: 4.56 MIL/uL (ref 4.22–5.81)
RDW: 12.1 % (ref 11.5–15.5)
WBC: 6.1 10*3/uL (ref 4.0–10.5)
nRBC: 0 % (ref 0.0–0.2)

## 2021-09-06 LAB — CBG MONITORING, ED: Glucose-Capillary: 120 mg/dL — ABNORMAL HIGH (ref 70–99)

## 2021-09-06 MED ORDER — SODIUM CHLORIDE 0.9 % IV SOLN: INTRAVENOUS | Status: DC

## 2021-09-06 MED ORDER — SODIUM CHLORIDE 0.9 % IV BOLUS
1000.0000 mL | Freq: Once | INTRAVENOUS | Status: AC
  Administered 2021-09-06: 1000 mL via INTRAVENOUS

## 2021-09-06 NOTE — ED Triage Notes (Signed)
Police called by mother due to pt drinking and possibly unresponsive. EMS then called. Seizures witnessed by ems. 5 of versed given. Empty bottle of neurotin found at scene but unknown if all taken today.

## 2021-09-07 ENCOUNTER — Emergency Department: Payer: Medicaid Other

## 2021-09-07 DIAGNOSIS — F332 Major depressive disorder, recurrent severe without psychotic features: Secondary | ICD-10-CM | POA: Diagnosis present

## 2021-09-07 DIAGNOSIS — F10929 Alcohol use, unspecified with intoxication, unspecified: Secondary | ICD-10-CM | POA: Diagnosis present

## 2021-09-07 LAB — URINE DRUG SCREEN, QUALITATIVE (ARMC ONLY)
Amphetamines, Ur Screen: NOT DETECTED
Barbiturates, Ur Screen: NOT DETECTED
Benzodiazepine, Ur Scrn: POSITIVE — AB
Cannabinoid 50 Ng, Ur ~~LOC~~: NOT DETECTED
Cocaine Metabolite,Ur ~~LOC~~: POSITIVE — AB
MDMA (Ecstasy)Ur Screen: NOT DETECTED
Methadone Scn, Ur: NOT DETECTED
Opiate, Ur Screen: NOT DETECTED
Phencyclidine (PCP) Ur S: NOT DETECTED
Tricyclic, Ur Screen: NOT DETECTED

## 2021-09-07 LAB — URINALYSIS, ROUTINE W REFLEX MICROSCOPIC
Bilirubin Urine: NEGATIVE
Glucose, UA: NEGATIVE mg/dL
Ketones, ur: NEGATIVE mg/dL
Leukocytes,Ua: NEGATIVE
Nitrite: NEGATIVE
Protein, ur: NEGATIVE mg/dL
Specific Gravity, Urine: <1.005 — ABNORMAL LOW (ref 1.005–1.030)
pH: 5.5 (ref 5.0–8.0)

## 2021-09-07 LAB — RESP PANEL BY RT-PCR (FLU A&B, COVID) ARPGX2
Influenza A by PCR: NEGATIVE
Influenza B by PCR: NEGATIVE
SARS Coronavirus 2 by RT PCR: NEGATIVE

## 2021-09-07 LAB — CBG MONITORING, ED
Glucose-Capillary: 254 mg/dL — ABNORMAL HIGH (ref 70–99)
Glucose-Capillary: 226 mg/dL — ABNORMAL HIGH (ref 70–99)
Glucose-Capillary: 177 mg/dL — ABNORMAL HIGH (ref 70–99)

## 2021-09-07 LAB — URINALYSIS, MICROSCOPIC (REFLEX): Squamous Epithelial / HPF: NONE SEEN (ref 0–5)

## 2021-09-07 LAB — VALPROIC ACID LEVEL: Valproic Acid Lvl: <10 ug/mL — ABNORMAL LOW (ref 50.0–100.0)

## 2021-09-07 LAB — SALICYLATE LEVEL: Salicylate Lvl: <7 mg/dL — ABNORMAL LOW (ref 7.0–30.0)

## 2021-09-07 LAB — ACETAMINOPHEN LEVEL: Acetaminophen (Tylenol), Serum: <10 ug/mL — ABNORMAL LOW (ref 10–30)

## 2021-09-07 LAB — ETHANOL: Alcohol, Ethyl (B): 301 mg/dL (ref <10)

## 2021-09-07 MED ORDER — DIVALPROEX SODIUM 500 MG PO DR TAB: 500.0000 mg | DELAYED_RELEASE_TABLET | Freq: Two times a day (BID) | ORAL | 3 refills | Status: AC

## 2021-09-07 MED ORDER — HYDROXYZINE HCL 25 MG PO TABS
100.0000 mg | ORAL_TABLET | Freq: Three times a day (TID) | ORAL | Status: DC | PRN
  Administered 2021-09-08: 100 mg via ORAL
  Filled 2021-09-07: qty 4

## 2021-09-07 MED ORDER — HYDROXYZINE HCL 25 MG PO TABS: 25.0000 mg | ORAL_TABLET | Freq: Four times a day (QID) | ORAL | Status: DC | PRN

## 2021-09-07 MED ORDER — GABAPENTIN 100 MG PO CAPS
100.0000 mg | ORAL_CAPSULE | Freq: Three times a day (TID) | ORAL | Status: DC
  Administered 2021-09-07 – 2021-09-09 (×6): 100 mg via ORAL
  Filled 2021-09-07 (×6): qty 1

## 2021-09-07 MED ORDER — LOPERAMIDE HCL 2 MG PO CAPS: 2.0000 mg | ORAL_CAPSULE | ORAL | Status: DC | PRN

## 2021-09-07 MED ORDER — LORAZEPAM 1 MG PO TABS
1.0000 mg | ORAL_TABLET | Freq: Four times a day (QID) | ORAL | Status: DC | PRN
  Administered 2021-09-08: 1 mg via ORAL
  Filled 2021-09-07: qty 1

## 2021-09-07 MED ORDER — GABAPENTIN 100 MG PO CAPS: 100.0000 mg | ORAL_CAPSULE | Freq: Three times a day (TID) | ORAL | Status: DC

## 2021-09-07 MED ORDER — DIVALPROEX SODIUM 500 MG PO DR TAB
500.0000 mg | DELAYED_RELEASE_TABLET | Freq: Two times a day (BID) | ORAL | Status: DC
  Administered 2021-09-07 – 2021-09-09 (×4): 500 mg via ORAL
  Filled 2021-09-07 (×4): qty 1

## 2021-09-07 MED ORDER — THIAMINE HCL 100 MG PO TABS
100.0000 mg | ORAL_TABLET | Freq: Every day | ORAL | Status: DC
  Administered 2021-09-08 – 2021-09-09 (×2): 100 mg via ORAL
  Filled 2021-09-07 (×2): qty 1

## 2021-09-07 MED ORDER — LEVETIRACETAM IN NACL 1000 MG/100ML IV SOLN
1000.0000 mg | Freq: Two times a day (BID) | INTRAVENOUS | Status: DC
  Administered 2021-09-07: 1000 mg via INTRAVENOUS
  Filled 2021-09-07: qty 100

## 2021-09-07 MED ORDER — LORAZEPAM 1 MG PO TABS
1.0000 mg | ORAL_TABLET | Freq: Three times a day (TID) | ORAL | Status: AC
  Administered 2021-09-08 – 2021-09-09 (×3): 1 mg via ORAL
  Filled 2021-09-07 (×3): qty 1

## 2021-09-07 MED ORDER — ADULT MULTIVITAMIN W/MINERALS CH
1.0000 | ORAL_TABLET | Freq: Every day | ORAL | Status: DC
  Administered 2021-09-08 – 2021-09-09 (×2): 1 via ORAL
  Filled 2021-09-07 (×2): qty 1

## 2021-09-07 MED ORDER — INSULIN ASPART PROT & ASPART (70-30 MIX) 100 UNIT/ML ~~LOC~~ SUSP
20.0000 [IU] | Freq: Two times a day (BID) | SUBCUTANEOUS | Status: DC
  Administered 2021-09-07 – 2021-09-09 (×4): 20 [IU] via SUBCUTANEOUS
  Filled 2021-09-07 (×2): qty 10

## 2021-09-07 MED ORDER — NALOXONE HCL 2 MG/2ML IJ SOSY
1.0000 mg | PREFILLED_SYRINGE | Freq: Once | INTRAMUSCULAR | Status: AC
  Administered 2021-09-07: 1 mg via INTRAVENOUS
  Filled 2021-09-07: qty 2

## 2021-09-07 MED ORDER — LORAZEPAM 1 MG PO TABS
1.0000 mg | ORAL_TABLET | Freq: Every day | ORAL | Status: DC
Start: 2021-09-11 — End: 2021-09-09

## 2021-09-07 MED ORDER — ONDANSETRON 4 MG PO TBDP: 4.0000 mg | ORAL_TABLET | Freq: Four times a day (QID) | ORAL | Status: DC | PRN

## 2021-09-07 MED ORDER — LORAZEPAM 0.5 MG PO TABS
0.5000 mg | ORAL_TABLET | Freq: Once | ORAL | Status: AC
  Administered 2021-09-07: 0.5 mg via ORAL
  Filled 2021-09-07: qty 1

## 2021-09-07 MED ORDER — THIAMINE HCL 100 MG/ML IJ SOLN
100.0000 mg | Freq: Once | INTRAMUSCULAR | Status: AC
Start: 2021-09-07 — End: 2021-09-07
  Administered 2021-09-07: 100 mg via INTRAMUSCULAR
  Filled 2021-09-07: qty 2

## 2021-09-07 MED ORDER — LIDOCAINE 5 % EX PTCH
1.0000 | MEDICATED_PATCH | Freq: Two times a day (BID) | CUTANEOUS | Status: DC
  Administered 2021-09-07 – 2021-09-09 (×4): 1 via TRANSDERMAL
  Filled 2021-09-07 (×4): qty 1

## 2021-09-07 MED ORDER — LORAZEPAM 1 MG PO TABS
1.0000 mg | ORAL_TABLET | Freq: Four times a day (QID) | ORAL | Status: AC
  Administered 2021-09-07 – 2021-09-08 (×4): 1 mg via ORAL
  Filled 2021-09-07 (×4): qty 1

## 2021-09-07 MED ORDER — LORAZEPAM 1 MG PO TABS: 1.0000 mg | ORAL_TABLET | Freq: Two times a day (BID) | ORAL | Status: DC

## 2021-09-07 NOTE — ED Provider Notes (Signed)
[facility] Emergency Department Provider Note ____________________________________________   Event Date/Time   First MD Initiated Contact with Patient 09/06/21 2321     (approximate)  I have reviewed the triage vital signs and the nursing notes.   HISTORY  Chief Complaint Seizures (Ems witnessed seizure. Etoh on board)    HPI Brandon Silva is a 60 y.o. male with history of hypertension, diabetes, alcohol abuse, seizures, medical noncompliance with Depakote who presents to the emergency department with EMS for seizure-like activity.  Had witnessed seizure with EMS.  They gave him 5 mg of Versed.  Per EMS there is an empty bottle of gabapentin found near the patient and he smelled of alcohol.  Has history of cocaine abuse as well.  EMS reports mother called 911.  Patient unable to provide any history.  Blood glucose was in the 150s with EMS.         Past Medical History:  Diagnosis Date   Alcohol abuse    Diabetes mellitus without complication (HCC)    Hypertension    Seizures (HCC)    Ventilator dependence (HCC)     Patient Active Problem List   Diagnosis Date Noted   Substance induced mood disorder (HCC) 04/14/2021   Hallucination 02/12/2021   Alcohol use 01/17/2021   Cocaine abuse (HCC) 01/17/2021   Seizure disorder (HCC) 01/17/2021   Non compliance w medication regimen 01/16/2021   Traumatic rhabdomyolysis (HCC)    Drug abuse (HCC)    Recurrent seizures (HCC) 07/20/2020   Seizures (HCC) 09/17/2016   Hypocalcemia    AKI (acute kidney injury) (HCC)    Acute encephalopathy    ETOH abuse    Uncontrolled type 2 diabetes mellitus with ketoacidosis without coma, without long-term current use of insulin (HCC)    Seizure (HCC) 02/04/2016    History reviewed. No pertinent surgical history.  Prior to Admission medications   Medication Sig Start Date End Date Taking? Authorizing Provider  acetaminophen (TYLENOL) 325 MG tablet Take 650 mg by  mouth every 6 (six) hours as needed.    [provider]  divalproex (DEPAKOTE) 500 MG DR tablet Take 1 tablet (500 mg total) by mouth 2 (two) times daily. 09/07/21   Willamae Demby, Layla Maw, DO  gabapentin (NEURONTIN) 100 MG capsule Take 1 capsule (100 mg total) by mouth 3 (three) times daily. 02/13/21 03/15/21  Danford, Earl Lites, MD  hydrOXYzine (VISTARIL) 50 MG capsule Take 2 capsules (100 mg total) by mouth every 8 (eight) hours as needed for anxiety. 04/14/21   Clapacs, Jackquline Denmark, MD  insulin NPH-regular Human (70-30) 100 UNIT/ML injection Inject 15-35 Units into the skin 2 (two) times daily. 02/13/21   Danford, Earl Lites, MD  Lidocaine (HM LIDOCAINE PATCH) 4 % PTCH Apply 1 patch topically every 12 (twelve) hours. 02/23/21   Orvil Feil, PA-C    Allergies Penicillins  Family History  Problem Relation Age of Onset   Diabetes Other     Social History Social History   Tobacco Use   Smoking status: Every Day    Types: Cigarettes   Smokeless tobacco: Never  Substance Use Topics   Alcohol use: Yes   Drug use: No    Review of Systems Level 5 caveat secondary to altered mental status  ____________________________________________   PHYSICAL EXAM:  VITAL SIGNS: ED Triage Vitals  Enc Vitals Group     BP 09/06/21 2334 98/64     Pulse Rate 09/06/21 2334 66     Resp  09/06/21 2334 13     Temp 09/06/21 2337 97.9 F (36.6 C)     Temp Source 09/06/21 2337 Oral     SpO2 09/06/21 2328 97 %     Weight 09/06/21 2335 221 lb 1.9 oz (100.3 kg)     Height --      Head Circumference --      Peak Flow --      Pain Score 09/06/21 2334 Asleep     Pain Loc --      Pain Edu? --      Excl. in GC? --    CONSTITUTIONAL: Alert, postictal, unable to answer questions or follow commands HEAD: Normocephalic; atraumatic EYES: Conjunctivae clear, pupils pinpoint ENT: normal nose; no rhinorrhea; moist mucous membranes; pharynx without lesions noted; no dental injury; no septal hematoma NECK:  Supple, no meningismus, no LAD; no midline spinal tenderness, step-off or deformity; trachea midline CARD: RRR; S1 and S2 appreciated; no murmurs, no clicks, no rubs, no gallops RESP: Normal chest excursion without splinting or tachypnea; breath sounds clear and equal bilaterally; no wheezes, no rhonchi, no rales; no hypoxia or respiratory distress CHEST:  chest wall stable, no crepitus or ecchymosis or deformity, nontender to palpation; no flail chest ABD/GI: Normal bowel sounds; non-distended; soft, non-tender, no rebound, no guarding; no ecchymosis or other lesions noted PELVIS:  stable, nontender to palpation BACK:  The back appears normal and is non-tender to palpation, there is no CVA tenderness; no midline spinal tenderness, step-off or deformity EXT: Normal ROM in all joints; non-tender to palpation; no edema; normal capillary refill; no cyanosis, no bony tenderness or bony deformity of patient's extremities, no joint effusion, compartments are soft, extremities are warm and well-perfused, no ecchymosis SKIN: Normal color for age and race; warm NEURO: Postictal   ____________________________________________   LABS (all labs ordered are listed, but only abnormal results are displayed)  Labs Reviewed  COMPREHENSIVE METABOLIC PANEL - Abnormal; Notable for the following components:      Result Value   Sodium 133 (*)    Glucose, Bld 123 (*)    Calcium 8.7 (*)    All other components within normal limits  ACETAMINOPHEN LEVEL - Abnormal; Notable for the following components:   Acetaminophen (Tylenol), Serum <10 (*)    All other components within normal limits  ETHANOL - Abnormal; Notable for the following components:   Alcohol, Ethyl (B) 301 (*)    All other components within normal limits  URINE DRUG SCREEN, QUALITATIVE ([facility] ONLY) - Abnormal; Notable for the following components:   Cocaine Metabolite,Ur [place] POSITIVE (*)    Benzodiazepine, Ur Scrn POSITIVE (*)    All other  components within normal limits  URINALYSIS, ROUTINE W REFLEX MICROSCOPIC - Abnormal; Notable for the following components:   Specific Gravity, Urine <1.005 (*)    Hgb urine dipstick TRACE (*)    All other components within normal limits  SALICYLATE LEVEL - Abnormal; Notable for the following components:   Salicylate Lvl <7.0 (*)    All other components within normal limits  VALPROIC ACID LEVEL - Abnormal; Notable for the following components:   Valproic Acid Lvl <10 (*)    All other components within normal limits  URINALYSIS, MICROSCOPIC (REFLEX) - Abnormal; Notable for the following components:   Bacteria, UA RARE (*)    All other components within normal limits  CBG MONITORING, ED - Abnormal; Notable for the following components:   Glucose-Capillary 120 (*)    All other components within normal  limits  CBC WITH DIFFERENTIAL/PLATELET  CBG MONITORING, ED   ____________________________________________  EKG   EKG Interpretation  Date/Time:  Friday September 06 2021 23:23:51 EDT Ventricular Rate:  74 PR Interval:  141 QRS Duration: 92 QT Interval:  423 QTC Calculation: 470 R Axis:   9 Text Interpretation: Sinus rhythm Borderline ST elevation, anterior leads No significant change since last tracing Confirmed by Rochele Raring [id number]) on 09/06/2021 11:34:48 PM        ____________________________________________  RADIOLOGY Normajean Baxter Jemaine Prokop, personally viewed and evaluated these images (plain radiographs) as part of my medical decision making, as well as reviewing the written report by the radiologist.  ED MD interpretation: CT head and cervical spine show no acute abnormality.  Official radiology report(s): CT HEAD WO CONTRAST ( )  Result Date: 09/07/2021 CLINICAL DATA:  Neck trauma. EXAM: CT HEAD WITHOUT CONTRAST CT CERVICAL SPINE WITHOUT CONTRAST TECHNIQUE: Multidetector CT imaging of the head and cervical spine was performed following the standard protocol without  intravenous contrast. Multiplanar CT image reconstructions of the cervical spine were also generated. COMPARISON:  Head CT dated 02/12/2021. FINDINGS: CT HEAD FINDINGS Brain: The ventricles and sulci appropriate size for patient's age. The gray-white matter discrimination is preserved. There is no acute intracranial hemorrhage. No mass effect or midline shift. No extra-axial fluid collection. Vascular: No hyperdense vessel or unexpected calcification. Skull: Normal. Negative for fracture or focal lesion. Sinuses/Orbits: Mild mucoperiosteal thickening of paranasal sinuses. No air-fluid level. The mastoid air cells are clear. Other: None CT CERVICAL SPINE FINDINGS Alignment: No acute subluxation. Skull base and vertebrae: Acute fracture. Soft tissues and spinal canal: No prevertebral fluid or swelling. No visible canal hematoma. Disc levels:  Degenerative changes primarily at C5-C6 Upper chest: Negative. Other: None IMPRESSION: 1. Unremarkable noncontrast CT of the brain. 2. No acute/traumatic cervical spine pathology. Electronically Signed   By: Elgie Collard M.D.   On: 09/07/2021 01:55   CT Cervical Spine Wo Contrast  Result Date: 09/07/2021 CLINICAL DATA:  Neck trauma. EXAM: CT HEAD WITHOUT CONTRAST CT CERVICAL SPINE WITHOUT CONTRAST TECHNIQUE: Multidetector CT imaging of the head and cervical spine was performed following the standard protocol without intravenous contrast. Multiplanar CT image reconstructions of the cervical spine were also generated. COMPARISON:  Head CT dated 02/12/2021. FINDINGS: CT HEAD FINDINGS Brain: The ventricles and sulci appropriate size for patient's age. The gray-white matter discrimination is preserved. There is no acute intracranial hemorrhage. No mass effect or midline shift. No extra-axial fluid collection. Vascular: No hyperdense vessel or unexpected calcification. Skull: Normal. Negative for fracture or focal lesion. Sinuses/Orbits: Mild mucoperiosteal thickening of  paranasal sinuses. No air-fluid level. The mastoid air cells are clear. Other: None CT CERVICAL SPINE FINDINGS Alignment: No acute subluxation. Skull base and vertebrae: Acute fracture. Soft tissues and spinal canal: No prevertebral fluid or swelling. No visible canal hematoma. Disc levels:  Degenerative changes primarily at C5-C6 Upper chest: Negative. Other: None IMPRESSION: 1. Unremarkable noncontrast CT of the brain. 2. No acute/traumatic cervical spine pathology. Electronically Signed   By: Elgie Collard M.D.   On: 09/07/2021 01:55    ____________________________________________   PROCEDURES  Procedure(s) performed (including Critical Care):  Procedures  CRITICAL CARE Performed by: Baxter Hire Jhade Berko   Total critical care time: 45 minutes  Critical care time was exclusive of separately billable procedures and treating other patients.  Critical care was necessary to treat or prevent imminent or life-threatening deterioration.  Critical care was time spent personally by me on the following activities: development  of treatment plan with patient and/or surrogate as well as nursing, discussions with consultants, evaluation of patient's response to treatment, examination of patient, obtaining history from patient or surrogate, ordering and performing treatments and interventions, ordering and review of laboratory studies, ordering and review of radiographic studies, pulse oximetry and re-evaluation of patient's condition.  ____________________________________________   INITIAL IMPRESSION / ASSESSMENT AND PLAN / ED COURSE  As part of my medical decision making, I reviewed the following data within the electronic MEDICAL RECORD NUMBER History obtained from family, Nursing notes reviewed and incorporated, Labs reviewed , EKG interpreted , Old EKG reviewed, CTs reviewed, and Notes from prior ED visits         Patient here after witnessed seizure with EMS.  Given Versed.  No family currently at  bedside.  Patient is postictal, sedated and likely intoxicated as well.  Smells of alcohol.  Reportedly there was an empty bottle of gabapentin next to him but it is unclear if this was all taken today.  We will check Tylenol, salicylate level as well as urine drug screen, ethanol level.  Will obtain CT of the head and cervical spine.  No signs of traumatic injury on exam.  Blood glucose normal.  EKG nonischemic.  Patient with pinpoint pupils.  Given Narcan without any change.  Patient is postictal and not following commands, answering questions.  Very minimal movement to painful stimuli.  Satting 100% right now.  Appears to be protecting his airway but will monitor very closely as he would be high risk for needing intubation.  ED PROGRESS  1:07 AM  Pt awake and has gotten out of bed on his own stating that he needs to urinate.  Still appears intoxicated, confused.  Redirected back to the bed.  Will get sitter for him.  Labs show subtherapeutic Depakote level.  Given IV Depakote is on national shortage, will give IV Keppra.  Urine positive for cocaine and benzodiazepines.  Alcohol level of 301.  Tylenol and salicylate levels negative.  Spoke with mother.  She is not sure who called 911 - she says it was "the lady over there in the office" in [place].  She said they called the police who called paramedics.  She is not sure what happened.  She states her son lives in the house next to her.  7:25 AM  Pt's CT head and cervical spine show no acute abnormality.  Patient is arousable to voice but falls asleep quickly.  He is able to tell me his name and move all extremities.  Will attempt to ambulate, p.o. challenge.  Anticipate discharge home.  We will refill his Depakote.  At this time, I do not feel there is any life-threatening condition present. I have reviewed, interpreted and discussed all results (EKG, imaging, lab, urine as appropriate) and exam findings with patient/family. I have reviewed  nursing notes and appropriate previous records.  I feel the patient is safe to be discharged home without further emergent workup and can continue workup as an outpatient as needed. Discussed usual and customary return precautions. Patient/family verbalize understanding and are comfortable with this plan.  Outpatient follow-up has been provided as needed. All questions have been answered.  ____________________________________________   FINAL CLINICAL IMPRESSION(S) / ED DIAGNOSES  Final diagnoses:  Seizure (HCC)  Alcoholic intoxication without complication (HCC)  Cocaine abuse [facility])     ED Discharge Orders          Ordered    divalproex (DEPAKOTE) 500 MG DR  tablet  2 times daily        09/07/21 1607            *Please note:  Toshiyuki Fredell was evaluated in Emergency Department on 09/07/2021 for the symptoms described in the history of present illness. He was evaluated in the context of the global COVID-19 pandemic, which necessitated consideration that the patient might be at risk for infection with the SARS-CoV-2 virus that causes COVID-19. Institutional protocols and algorithms that pertain to the evaluation of patients at risk for COVID-19 are in a state of rapid change based on information released by regulatory bodies including the CDC and federal and state organizations. These policies and algorithms were followed during the patient's care in the ED.  Some ED evaluations and interventions may be delayed as a result of limited staffing during and the pandemic.*   Note:  This document was prepared using Dragon voice recognition software and may include unintentional dictation errors.    Accalia Rigdon, Layla Maw, DO 09/07/21 [phone]

## 2021-09-07 NOTE — ED Notes (Signed)
Dietary called and states ticket is out and pt will receive food shortly

## 2021-09-07 NOTE — ED Notes (Addendum)
Belongings include (bag 1/1):  1 pair of socks  1 black belt 2 cell phones  1 navy blue pants 1 brown wallet  Per previous RN, pt other clothes had to be thrown away.

## 2021-09-07 NOTE — BH Assessment (Addendum)
Referral information for Psychiatric Hospitalization faxed to;   [facility] ([phone]- [phone]), No answer  [facility] [phone] -[phone]) [phone] No behavioral health intake staff available until after 8am  [facility] [phone]), No answer, voicemail was left for return phone call  [facility] [phone]), No answer  [facility] [phone] -[phone]), Currently under review with Durenda Age, current concerns with patients seizures  [facility] [phone] or [phone]), Staff reports facility is currently in quarantine due to Covid outbreak, currently not accepting patients at this time

## 2021-09-07 NOTE — ED Notes (Signed)
Lunch tray and cranberry juice given to pt at this time.  

## 2021-09-07 NOTE — ED Notes (Signed)
Critical ETOH level verbally given to Ward MD.

## 2021-09-07 NOTE — ED Provider Notes (Signed)
Patient is more awake now, asking for food.  When queried about pill bottle he reports that he took "a lot of" and was "trying to kill himself ".  We will place the patient under involuntary commitment and consulted TTS and psychiatry   Jene Every, MD 09/07/21 1024

## 2021-09-07 NOTE — Consult Note (Signed)
[facility]   Reason for Consult:  Overdose, alcohol abuse Referring Physician:  EDP Patient Identification: Brandon BrennerDavid Silva MRN:  [id number] Principal Diagnosis: Seizure d/o Diagnosis:  Active Problems:   Alcohol intoxication (HCC)   Major depressive disorder, recurrent severe without psychotic features (HCC)  Total Time spent with patient: 1 hour  Subjective:   Brandon BrennerDavid Silva is a 60 y.o. male patient admitted with seizures, overdose, and alcohol abuse.  "I need something for my anxiety."  HPI:  60 yo male presented with a seizure; history of alcohol use d/o and depression.  He reports taking "a whole bottle of something", believed to be gabapentin, to end his life prior to admission., confirms this.  He does not expand on this, crying on assessment.  Forwards little information.  Depression and anxiety are high, alcohol BAL 301 and history of alcohol abuse.  No hallucinations.  Medical history of seizures.  Psychiatric admission needed for stabilization.  Past Psychiatric History: depression, anxiety, alcohol use d/o  Risk to Self: yes  Risk to Others:  none Prior Inpatient Therapy:  yes Prior Outpatient Therapy:  none  Past Medical History:  Past Medical History:  Diagnosis Date   Alcohol abuse    Diabetes mellitus without complication (HCC)    Hypertension    Seizures (HCC)    Ventilator dependence (HCC)    History reviewed. No pertinent surgical history. Family History:  Family History  Problem Relation Age of Onset   Diabetes Other    Family Psychiatric  History: none Social History:  Social History   Substance and Sexual Activity  Alcohol Use Yes     Social History   Substance and Sexual Activity  Drug Use No    Social History   Socioeconomic History   Marital status: Single    Spouse name: Not on file   Number of children: Not on file   Years of education: Not on file   Highest education level: Not on file  Occupational History    Not on file  Tobacco Use   Smoking status: Every Day    Types: Cigarettes   Smokeless tobacco: Never  Substance and Sexual Activity   Alcohol use: Yes   Drug use: No   Sexual activity: Yes  Other Topics Concern   Not on file  Social History Narrative   Not on file   Social Determinants of Health   Financial Resource Strain: Not on file  Food Insecurity: Not on file  Transportation Needs: Not on file  Physical Activity: Not on file  Stress: Not on file  Social Connections: Not on file   Additional Social History:    Allergies:   Allergies  Allergen Reactions   Penicillins Rash    Has patient had a PCN reaction causing immediate rash, facial/tongue/throat swelling, SOB or lightheadedness with hypotension: Yes Has patient had a PCN reaction causing severe rash involving mucus membranes or skin necrosis: No Has patient had a PCN reaction that required hospitalization: No Has patient had a PCN reaction occurring within the last 10 years: No If all of the above answers are "NO", then may proceed with Cephalosporin use.     Labs:  Results for orders placed or performed during the hospital encounter of 09/06/21 (from the past 48 hour(s))  Comprehensive metabolic panel     Status: Abnormal   Collection Time: 09/06/21 11:23 PM  Result Value Ref Range   Sodium 133 (L) 135 - 145 mmol/L   Potassium 3.7 3.5 -  5.1 mmol/L   Chloride 99 98 - 111 mmol/L   CO2 23 22 - 32 mmol/L   Glucose, Bld 123 (H) 70 - 99 mg/dL    Comment: Glucose reference range applies only to samples taken after fasting for at least 8 hours.   BUN 11 6 - 20 mg/dL   Creatinine, Ser 1.61 0.61 - 1.24 mg/dL   Calcium 8.7 (L) 8.9 - 10.3 mg/dL   Total Protein 7.3 6.5 - 8.1 g/dL   Albumin 4.1 3.5 - 5.0 g/dL   AST 34 15 - 41 U/L   ALT 26 0 - 44 U/L   Alkaline Phosphatase 95 38 - 126 U/L   Total Bilirubin 0.7 0.3 - 1.2 mg/dL   GFR, Estimated >09 >60 mL/min    Comment: (NOTE) Calculated using the CKD-EPI  Creatinine Equation (2021)    Anion gap 11 5 - 15    Comment: Performed at [facility], [address] [place], [place] [address]  CBC with Differential/Platelet     Status: None   Collection Time: 09/06/21 11:23 PM  Result Value Ref Range   WBC 6.1 4.0 - 10.5 K/uL   RBC 4.56 4.22 - 5.81 MIL/uL   Hemoglobin 14.6 13.0 - 17.0 g/dL   HCT 81.1 91.4 - 78.2 %   MCV 89.5 80.0 - 100.0 fL   MCH 32.0 26.0 - 34.0 pg   MCHC 35.8 30.0 - 36.0 g/dL   RDW 95.6 21.3 - 08.6 %   Platelets 218 150 - 400 K/uL   nRBC 0.0 0.0 - 0.2 %   Neutrophils Relative % 55 %   Neutro Abs 3.4 1.7 - 7.7 K/uL   Lymphocytes Relative 36 %   Lymphs Abs 2.2 0.7 - 4.0 K/uL   Monocytes Relative 5 %   Monocytes Absolute 0.3 0.1 - 1.0 K/uL   Eosinophils Relative 3 %   Eosinophils Absolute 0.2 0.0 - 0.5 K/uL   Basophils Relative 1 %   Basophils Absolute 0.1 0.0 - 0.1 K/uL   Immature Granulocytes 0 %   Abs Immature Granulocytes 0.01 0.00 - 0.07 K/uL    Comment: Performed at [facility], [address] [place], [place] [address]  Acetaminophen level     Status: Abnormal   Collection Time: 09/06/21 11:23 PM  Result Value Ref Range   Acetaminophen (Tylenol), Serum <10 (L) 10 - 30 ug/mL    Comment: (NOTE) Therapeutic concentrations vary significantly. A range of 10-30 ug/mL  may be an effective concentration for many patients. However, some  are best treated at concentrations outside of this range. Acetaminophen concentrations >150 ug/mL at 4 hours after ingestion  and >50 ug/mL at 12 hours after ingestion are often associated with  toxic reactions.  Performed at [facility], [address] [place], [place] [address]   Ethanol     Status: Abnormal   Collection Time: 09/06/21 11:23 PM  Result Value Ref Range   Alcohol, Ethyl (B) 301 (HH) <10 mg/dL    Comment: CRITICAL RESULT CALLED TO, READ BACK BY AND VERIFIED WITH MELISSA HOUP AT 0000 09/07/2021 DLB (NOTE) Lowest detectable limit  for serum alcohol is 10 mg/dL.  For medical purposes only. Performed at [facility], [address] [place], [place] [address]   Urinalysis, Routine w reflex microscopic     Status: Abnormal   Collection Time: 09/06/21 11:23 PM  Result Value Ref Range   Color, Urine YELLOW YELLOW   APPearance CLEAR CLEAR  Specific Gravity, Urine <1.005 (L) 1.005 - 1.030   pH 5.5 5.0 - 8.0   Glucose, UA NEGATIVE NEGATIVE mg/dL   Hgb urine dipstick TRACE (A) NEGATIVE   Bilirubin Urine NEGATIVE NEGATIVE   Ketones, ur NEGATIVE NEGATIVE mg/dL   Protein, ur NEGATIVE NEGATIVE mg/dL   Nitrite NEGATIVE NEGATIVE   Leukocytes,Ua NEGATIVE NEGATIVE    Comment: Performed at [facility], [address] [place], [place] [address]  Salicylate level     Status: Abnormal   Collection Time: 09/06/21 11:23 PM  Result Value Ref Range   Salicylate Lvl <7.0 (L) 7.0 - 30.0 mg/dL    Comment: Performed at [facility], [address] [place], [place] [address]  Valproic acid level     Status: Abnormal   Collection Time: 09/06/21 11:23 PM  Result Value Ref Range   Valproic Acid Lvl <10 (L) 50.0 - 100.0 ug/mL    Comment: RESULTS CONFIRMED BY MANUAL DILUTION DLB Performed at [facility], [address] [place], [place] [address]   Urinalysis, Microscopic (reflex)     Status: Abnormal   Collection Time: 09/06/21 11:23 PM  Result Value Ref Range   RBC / HPF 0-5 0 - 5 RBC/hpf   WBC, UA 0-5 0 - 5 WBC/hpf   Bacteria, UA RARE (A) NONE SEEN   Squamous Epithelial / LPF NONE SEEN 0 - 5   Hyaline Casts, UA PRESENT    Amorphous Crystal PRESENT     Comment: Performed at [facility], [address] [place], [place] [address]  Urine Drug Screen, Qualitative     Status: Abnormal   Collection Time: 09/06/21 11:33 PM  Result Value Ref Range   Tricyclic, Ur Screen NONE DETECTED NONE DETECTED   Amphetamines, Ur Screen NONE DETECTED NONE DETECTED   MDMA (Ecstasy)Ur Screen  NONE DETECTED NONE DETECTED   Cocaine Metabolite,Ur [place] POSITIVE (A) NONE DETECTED   Opiate, Ur Screen NONE DETECTED NONE DETECTED   Phencyclidine (PCP) Ur S NONE DETECTED NONE DETECTED   Cannabinoid 50 Ng, Ur [place] NONE DETECTED NONE DETECTED   Barbiturates, Ur Screen NONE DETECTED NONE DETECTED   Benzodiazepine, Ur Scrn POSITIVE (A) NONE DETECTED   Methadone Scn, Ur NONE DETECTED NONE DETECTED    Comment: (NOTE) Tricyclics + metabolites, urine    Cutoff 1000 ng/mL Amphetamines + metabolites, urine  Cutoff 1000 ng/mL MDMA (Ecstasy), urine              Cutoff 500 ng/mL Cocaine Metabolite, urine          Cutoff 300 ng/mL Opiate + metabolites, urine        Cutoff 300 ng/mL Phencyclidine (PCP), urine         Cutoff 25 ng/mL Cannabinoid, urine                 Cutoff 50 ng/mL Barbiturates + metabolites, urine  Cutoff 200 ng/mL Benzodiazepine, urine              Cutoff 200 ng/mL Methadone, urine                   Cutoff 300 ng/mL  The urine drug screen provides only a preliminary, unconfirmed analytical test result and should not be used for non-medical purposes. Clinical consideration and professional judgment should be applied to any positive drug screen result due to possible interfering substances. A more specific alternate chemical method must be used in order to obtain a confirmed analytical result. Gas chromatography /  mass spectrometry (GC/MS) is the preferred confirm atory method. Performed at [facility], [address] [place], [place] [address]   CBG monitoring, ED     Status: Abnormal   Collection Time: 09/06/21 11:35 PM  Result Value Ref Range   Glucose-Capillary 120 (H) 70 - 99 mg/dL    Comment: Glucose reference range applies only to samples taken after fasting for at least 8 hours.    Current Facility-Administered Medications  Medication Dose Route Frequency Provider Last Rate Last Admin   0.9 %  sodium chloride infusion   Intravenous Continuous Ward,  Layla Maw, DO 125 mL/hr at 09/07/21 0834 Rate Verify at 09/07/21 0834   gabapentin (NEURONTIN) capsule 100 mg  100 mg Oral TID Charm Rings, NP       hydrOXYzine (ATARAX/VISTARIL) tablet 25 mg  25 mg Oral Q6H PRN Charm Rings, NP       levETIRAcetam (KEPPRA) IVPB 1000 mg/100 mL premix  1,000 mg Intravenous Q12H Ward, Kristen N, DO   Stopped at 09/07/21 0142   loperamide (IMODIUM) capsule 2-4 mg  2-4 mg Oral PRN Charm Rings, NP       LORazepam (ATIVAN) tablet 1 mg  1 mg Oral Q6H PRN Charm Rings, NP       LORazepam (ATIVAN) tablet 1 mg  1 mg Oral QID Charm Rings, NP       Followed by   Melene Muller ON 09/08/2021] LORazepam (ATIVAN) tablet 1 mg  1 mg Oral TID Charm Rings, NP       Followed by   Melene Muller ON 09/09/2021] LORazepam (ATIVAN) tablet 1 mg  1 mg Oral BID Charm Rings, NP       Followed by   Melene Muller ON 09/11/2021] LORazepam (ATIVAN) tablet 1 mg  1 mg Oral Daily Charm Rings, NP       multivitamin with minerals tablet 1 tablet  1 tablet Oral Daily Charm Rings, NP       ondansetron (ZOFRAN-ODT) disintegrating tablet 4 mg  4 mg Oral Q6H PRN Charm Rings, NP       thiamine (B-1) injection 100 mg  100 mg Intramuscular Once Charm Rings, NP       [START ON 09/08/2021] thiamine tablet 100 mg  100 mg Oral Daily Charm Rings, NP       Current Outpatient Medications  Medication Sig Dispense Refill   acetaminophen (TYLENOL) 325 MG tablet Take 650 mg by mouth every 6 (six) hours as needed.     divalproex (DEPAKOTE) 500 MG DR tablet Take 1 tablet (500 mg total) by mouth 2 (two) times daily. 60 tablet 3   gabapentin (NEURONTIN) 100 MG capsule Take 1 capsule (100 mg total) by mouth 3 (three) times daily. 90 capsule 0   hydrOXYzine (VISTARIL) 50 MG capsule Take 2 capsules (100 mg total) by mouth every 8 (eight) hours as needed for anxiety. 60 capsule 1   insulin NPH-regular Human (70-30) 100 UNIT/ML injection Inject 15-35 Units into the skin 2 (two) times daily. 10 mL 11    Lidocaine (HM LIDOCAINE PATCH) 4 % PTCH Apply 1 patch topically every 12 (twelve) hours. 10 patch 0    Musculoskeletal: Strength & Muscle Tone: decreased Gait & Station: normal Patient leans: N/A  Psychiatric Specialty Exam: Physical Exam Vitals and nursing note reviewed.  Constitutional:      Appearance: Normal appearance.  HENT:     Head: Normocephalic.  Nose: Nose normal.  Pulmonary:     Effort: Pulmonary effort is normal.  Musculoskeletal:        General: Normal range of motion.     Cervical back: Normal range of motion.  Neurological:     General: No focal deficit present.     Mental Status: He is alert and oriented to person, place, and time.  Psychiatric:        Attention and Perception: Attention and perception normal.        Mood and Affect: Mood is anxious and depressed.        Speech: Speech normal.        Behavior: Behavior normal. Behavior is cooperative.        Thought Content: Thought content includes suicidal ideation. Thought content includes suicidal plan.        Cognition and Memory: Cognition and memory normal.        Judgment: Judgment is impulsive.    Review of Systems  Psychiatric/Behavioral:  Positive for depression, substance abuse and suicidal ideas. The patient is nervous/anxious.   All other systems reviewed and are negative.  Blood pressure 133/88, pulse 74, temperature 97.9 F (36.6 C), temperature source Oral, resp. rate 20, weight 100.3 kg, SpO2 99 %.Body mass index is 26.92 kg/m.  General Appearance: Casual  Eye Contact:  Fair  Speech:  Clear and Coherent  Volume:  Normal  Mood:  Anxious and Depressed  Affect:  Tearful  Thought Process:  Coherent and Descriptions of Associations: Intact  Orientation:  Full (Time, Place, and Person)  Thought Content:  WDL and Logical  Suicidal Thoughts:  No  Homicidal Thoughts:  No  Memory:  Immediate;   Fair Recent;   Fair Remote;   Fair  Judgement:  Poor  Insight:  Fair  Psychomotor Activity:   Decreased  Concentration:  Concentration: Fair and Attention Span: Fair  Recall:  [employer] of Knowledge:  Fair  Language:  Good  Akathisia:  No  Handed:  Right  AIMS (if indicated):     Assets:  Leisure Time Resilience  ADL's:  Intact  Cognition:  WNL  Sleep:        Physical Exam: Physical Exam Vitals and nursing note reviewed.  Constitutional:      Appearance: Normal appearance.  HENT:     Head: Normocephalic.     Nose: Nose normal.  Pulmonary:     Effort: Pulmonary effort is normal.  Musculoskeletal:        General: Normal range of motion.     Cervical back: Normal range of motion.  Neurological:     General: No focal deficit present.     Mental Status: He is alert and oriented to person, place, and time.  Psychiatric:        Attention and Perception: Attention and perception normal.        Mood and Affect: Mood is anxious and depressed.        Speech: Speech normal.        Behavior: Behavior normal. Behavior is cooperative.        Thought Content: Thought content includes suicidal ideation. Thought content includes suicidal plan.        Cognition and Memory: Cognition and memory normal.        Judgment: Judgment is impulsive.   Review of Systems  Psychiatric/Behavioral:  Positive for depression, substance abuse and suicidal ideas. The patient is nervous/anxious.   All other systems reviewed and are negative. Blood pressure  133/88, pulse 74, temperature 97.9 F (36.6 C), temperature source Oral, resp. rate 20, weight 100.3 kg, SpO2 99 %. Body mass index is 26.92 kg/m.  Treatment Plan Summary: Daily contact with patient to assess and evaluate symptoms and progress in treatment, Medication management, and Plan : Alcohol use disorder: -Started Ativan detox protocol -Held gabapentin 100 mg TID as it is unsure if this is what he overdosed on  Major depressive disorder, recurrent, severe without psychosis: -Admit to inpatient psychiatric unit for  stable  Disposition: Recommend psychiatric Inpatient admission when medically cleared.  Nanine Means, NP 09/07/2021 12:34 PM

## 2021-09-07 NOTE — ED Provider Notes (Signed)
Patient reevaluated, answering questions appropriately and certainly more alert.  Appears to be improving, will continue to monitor.   Jene Every, MD 09/07/21 [phone]

## 2021-09-07 NOTE — Group Note (Deleted)
LCSW Group Therapy Note  Group Date: 09/07/2021 Start Time: 1300 End Time: 1400   Type of Therapy and Topic:  Group Therapy - How To Cope with Nervousness about Discharge   Participation Level:  Did Not Attend   Description of Group This process group involved identification of patients' feelings about discharge. Some of them are scheduled to be discharged soon, while others are new admissions, but each of them was asked to share thoughts and feelings surrounding discharge from the hospital. One common theme was that they are excited at the prospect of going home, while another was that many of them are apprehensive about sharing why they were hospitalized. Patients were given the opportunity to discuss these feelings with their peers in preparation for discharge.  Therapeutic Goals  Patient will identify their overall feelings about pending discharge. Patient will think about how they might proactively address issues that they believe will once again arise once they get home (i.e. with parents). Patients will participate in discussion about having hope for change.   Summary of Patient Progress:  Patient did not attend group despite encouraged participation.    Therapeutic Modalities Cognitive Behavioral Therapy   Almedia Balls 09/07/2021  4:04 PM
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
## 2021-09-07 NOTE — ED Notes (Signed)
Writer talked to [facility] charge nurse, Stacy Gardner, regarding patient and if there would be any open admissions on the BMU this evening. Charge nurse reported that there wouldn't be enough staff this evening, but perhaps day/night shift tomorrow would have enough staff (if no one calls out) to accept this admission once patient is medically cleared. Writer will pass this information on to night shift and will fax patient out to other facilities once patient is medically cleared (if possible while on this shift).

## 2021-09-07 NOTE — ED Notes (Signed)
Pt on the phone with mother at this time.

## 2021-09-07 NOTE — ED Notes (Signed)
Pt awake and alerted RN he needed to urinate.Asked where he was at and what county.  Would not tell RN his name.

## 2021-09-07 NOTE — ED Notes (Addendum)
This RN at bedside, pt refusing to get out of his own pants at this time. Pt changed into our scrub top. Explained that we had to removed any harmful objects from him. Pt keeping repeating that he was leaving after he ate. Explained that was not an option right now but continues to state that he is leaving after he eats. Pt did removed his belt and noted to have our underwear on but will not switch out to our scrub bottoms. Pt asking to speak to doctor explained that psychiatrist would be in here soon. Pt wanded by officer.

## 2021-09-07 NOTE — Discharge Instructions (Addendum)
I recommend that you stop drinking alcohol, abusing cocaine and begin taking your Depakote as prescribed to prevent seizures.  follow up [facility]

## 2021-09-07 NOTE — ED Notes (Addendum)
Patient given crackers, peanut butter, applesauce, and cup of water to hold him over until tray can be delivered. Patient given urinal at this time. Patient asking for medication for anxiety. Cyril Loosen, MD aware.

## 2021-09-07 NOTE — ED Notes (Signed)
Pt ambulate to commode and back to bed x2.  Pt is very unstable.  Primary RN reports Pt usually utilizes a walker.

## 2021-09-07 NOTE — ED Notes (Signed)
Pt able to tolerate fluid and several snacks.

## 2021-09-07 NOTE — ED Notes (Signed)
Pt currently sleeping at this time. Dinner meal tray given at this time.  

## 2021-09-07 NOTE — ED Notes (Signed)
Pt noted to be having diarrhea.  Full linen change completed.

## 2021-09-07 NOTE — ED Notes (Signed)
Dietary called at this time by this RN to request a lunch tray for this pt. This pt given applesauce by this RN in the mean time.

## 2021-09-08 LAB — CBG MONITORING, ED
Glucose-Capillary: 250 mg/dL — ABNORMAL HIGH (ref 70–99)
Glucose-Capillary: 152 mg/dL — ABNORMAL HIGH (ref 70–99)
Glucose-Capillary: 223 mg/dL — ABNORMAL HIGH (ref 70–99)
Glucose-Capillary: 283 mg/dL — ABNORMAL HIGH (ref 70–99)

## 2021-09-08 MED ORDER — FLUOXETINE HCL 20 MG PO CAPS
20.0000 mg | ORAL_CAPSULE | Freq: Every day | ORAL | Status: DC
  Administered 2021-09-08 – 2021-09-09 (×2): 20 mg via ORAL
  Filled 2021-09-08: qty 1

## 2021-09-08 NOTE — ED Notes (Signed)
Pt rcvd snack tray and orange juice BS 152 at this time.

## 2021-09-08 NOTE — Consult Note (Signed)
[facility] Face-to-Face Psychiatry Consult   Reason for Consult:  Overdose, alcohol abuse Referring Physician:  EDP Patient Identification: Brandon Silva MRN:  [id number] Principal Diagnosis: Seizure d/o Diagnosis:  Active Problems:   Alcohol intoxication (HCC)   Major depressive disorder, recurrent severe without psychotic features (HCC)  Total Time spent with patient: 30 minutes  Subjective:   Brandon Silva is a 60 y.o. male patient admitted with seizures, overdose, and alcohol abuse.  "I got drunk and stupid." Reports "being in a bad place", "took a bottle of gabapentin". He reports living alone, has had ongoing depression for several months. He self discontinued Depakote which triggered his seizure. He reports being in Rehab/Detox "couple of years ago" "was sober for 4 years". Support system includes mother. He reports he "self medicates a lot" for his symptoms. He was tearful during this encounter. Denies audio/visual hallucinations. Denies homicidal ideation. Does not appear to be responding to internal or external stimuli. His Valproic acid <10, blood Ethyl alcohol level 301, UDS + Cocaine, Benzodiazepine.   HPI on admission on 9/10:  60 yo male presented with a seizure; history of alcohol use d/o and depression.  He reports taking "a whole bottle of something", believed to be gabapentin, to end his life prior to admission., confirms this.  He does not expand on this, crying on assessment.  Forwards little information.  Depression and anxiety are high, alcohol BAL 301 and history of alcohol abuse.  No hallucinations.  Medical history of seizures.  Psychiatric admission needed for stabilization.  Past Psychiatric History: depression, anxiety, alcohol use d/o  Risk to Self: yes  Risk to Others:  none Prior Inpatient Therapy:  yes Prior Outpatient Therapy:  none  Past Medical History:  Past Medical History:  Diagnosis Date   Alcohol abuse    Diabetes mellitus without complication (HCC)     Hypertension    Seizures (HCC)    Ventilator dependence (HCC)    History reviewed. No pertinent surgical history. Family History:  Family History  Problem Relation Age of Onset   Diabetes Other    Family Psychiatric  History: none Social History:  Social History   Substance and Sexual Activity  Alcohol Use Yes     Social History   Substance and Sexual Activity  Drug Use No    Social History   Socioeconomic History   Marital status: Single    Spouse name: Not on file   Number of children: Not on file   Years of education: Not on file   Highest education level: Not on file  Occupational History   Not on file  Tobacco Use   Smoking status: Every Day    Types: Cigarettes   Smokeless tobacco: Never  Substance and Sexual Activity   Alcohol use: Yes   Drug use: No   Sexual activity: Yes  Other Topics Concern   Not on file  Social History Narrative   Not on file   Social Determinants of Health   Financial Resource Strain: Not on file  Food Insecurity: Not on file  Transportation Needs: Not on file  Physical Activity: Not on file  Stress: Not on file  Social Connections: Not on file   Additional Social History:    Allergies:   Allergies  Allergen Reactions   Penicillins Rash    Has patient had a PCN reaction causing immediate rash, facial/tongue/throat swelling, SOB or lightheadedness with hypotension: Yes Has patient had a PCN reaction causing severe rash involving mucus membranes or skin  necrosis: No Has patient had a PCN reaction that required hospitalization: No Has patient had a PCN reaction occurring within the last 10 years: No If all of the above answers are "NO", then may proceed with Cephalosporin use.     Labs:  Results for orders placed or performed during the hospital encounter of 09/06/21 (from the past 48 hour(s))  Comprehensive metabolic panel     Status: Abnormal   Collection Time: 09/06/21 11:23 PM  Result Value Ref Range   Sodium 133  (L) 135 - 145 mmol/L   Potassium 3.7 3.5 - 5.1 mmol/L   Chloride 99 98 - 111 mmol/L   CO2 23 22 - 32 mmol/L   Glucose, Bld 123 (H) 70 - 99 mg/dL    Comment: Glucose reference range applies only to samples taken after fasting for at least 8 hours.   BUN 11 6 - 20 mg/dL   Creatinine, Ser 7.68 0.61 - 1.24 mg/dL   Calcium 8.7 (L) 8.9 - 10.3 mg/dL   Total Protein 7.3 6.5 - 8.1 g/dL   Albumin 4.1 3.5 - 5.0 g/dL   AST 34 15 - 41 U/L   ALT 26 0 - 44 U/L   Alkaline Phosphatase 95 38 - 126 U/L   Total Bilirubin 0.7 0.3 - 1.2 mg/dL   GFR, Estimated >11 >57 mL/min    Comment: (NOTE) Calculated using the CKD-EPI Creatinine Equation (2021)    Anion gap 11 5 - 15    Comment: Performed at [facility], [address] [place], [place] [address]  CBC with Differential/Platelet     Status: None   Collection Time: 09/06/21 11:23 PM  Result Value Ref Range   WBC 6.1 4.0 - 10.5 K/uL   RBC 4.56 4.22 - 5.81 MIL/uL   Hemoglobin 14.6 13.0 - 17.0 g/dL   HCT 55.9 74.1 - 63.8 %   MCV 89.5 80.0 - 100.0 fL   MCH 32.0 26.0 - 34.0 pg   MCHC 35.8 30.0 - 36.0 g/dL   RDW 45.3 64.6 - 80.3 %   Platelets 218 150 - 400 K/uL   nRBC 0.0 0.0 - 0.2 %   Neutrophils Relative % 55 %   Neutro Abs 3.4 1.7 - 7.7 K/uL   Lymphocytes Relative 36 %   Lymphs Abs 2.2 0.7 - 4.0 K/uL   Monocytes Relative 5 %   Monocytes Absolute 0.3 0.1 - 1.0 K/uL   Eosinophils Relative 3 %   Eosinophils Absolute 0.2 0.0 - 0.5 K/uL   Basophils Relative 1 %   Basophils Absolute 0.1 0.0 - 0.1 K/uL   Immature Granulocytes 0 %   Abs Immature Granulocytes 0.01 0.00 - 0.07 K/uL    Comment: Performed at [facility], [address] [place], [place] [address]  Acetaminophen level     Status: Abnormal   Collection Time: 09/06/21 11:23 PM  Result Value Ref Range   Acetaminophen (Tylenol), Serum <10 (L) 10 - 30 ug/mL    Comment: (NOTE) Therapeutic concentrations vary significantly. A range of 10-30 ug/mL  may be an effective  concentration for many patients. However, some  are best treated at concentrations outside of this range. Acetaminophen concentrations >150 ug/mL at 4 hours after ingestion  and >50 ug/mL at 12 hours after ingestion are often associated with  toxic reactions.  Performed at [facility], [address], [place], [place] [address]   Ethanol     Status: Abnormal   Collection Time: 09/06/21 11:23 PM  Result Value Ref Range   Alcohol, Ethyl (B) 301 (HH) <10 mg/dL    Comment: CRITICAL RESULT CALLED TO, READ BACK BY AND VERIFIED WITH MELISSA HOUP AT 0000 09/07/2021 DLB (NOTE) Lowest detectable limit for serum alcohol is 10 mg/dL.  For medical purposes only. Performed at [facility], [address] [place], [place] [address]   Urinalysis, Routine w reflex microscopic     Status: Abnormal   Collection Time: 09/06/21 11:23 PM  Result Value Ref Range   Color, Urine YELLOW YELLOW   APPearance CLEAR CLEAR   Specific Gravity, Urine <1.005 (L) 1.005 - 1.030   pH 5.5 5.0 - 8.0   Glucose, UA NEGATIVE NEGATIVE mg/dL   Hgb urine dipstick TRACE (A) NEGATIVE   Bilirubin Urine NEGATIVE NEGATIVE   Ketones, ur NEGATIVE NEGATIVE mg/dL   Protein, ur NEGATIVE NEGATIVE mg/dL   Nitrite NEGATIVE NEGATIVE   Leukocytes,Ua NEGATIVE NEGATIVE    Comment: Performed at [facility], [address] [place], [place] [address]  Salicylate level     Status: Abnormal   Collection Time: 09/06/21 11:23 PM  Result Value Ref Range   Salicylate Lvl <7.0 (L) 7.0 - 30.0 mg/dL    Comment: Performed at [facility], [address] [place], [place] [address]  Valproic acid level     Status: Abnormal   Collection Time: 09/06/21 11:23 PM  Result Value Ref Range   Valproic Acid Lvl <10 (L) 50.0 - 100.0 ug/mL    Comment: RESULTS CONFIRMED BY MANUAL DILUTION DLB Performed at [facility], [address] [place], [place] [address]   Urinalysis, Microscopic (reflex)      Status: Abnormal   Collection Time: 09/06/21 11:23 PM  Result Value Ref Range   RBC / HPF 0-5 0 - 5 RBC/hpf   WBC, UA 0-5 0 - 5 WBC/hpf   Bacteria, UA RARE (A) NONE SEEN   Squamous Epithelial / LPF NONE SEEN 0 - 5   Hyaline Casts, UA PRESENT    Amorphous Crystal PRESENT     Comment: Performed at [facility], [address] [place], [place] [address]  Urine Drug Screen, Qualitative     Status: Abnormal   Collection Time: 09/06/21 11:33 PM  Result Value Ref Range   Tricyclic, Ur Screen NONE DETECTED NONE DETECTED   Amphetamines, Ur Screen NONE DETECTED NONE DETECTED   MDMA (Ecstasy)Ur Screen NONE DETECTED NONE DETECTED   Cocaine Metabolite,Ur Axis POSITIVE (A) NONE DETECTED   Opiate, Ur Screen NONE DETECTED NONE DETECTED   Phencyclidine (PCP) Ur S NONE DETECTED NONE DETECTED   Cannabinoid 50 Ng, Ur [place] NONE DETECTED NONE DETECTED   Barbiturates, Ur Screen NONE DETECTED NONE DETECTED   Benzodiazepine, Ur Scrn POSITIVE (A) NONE DETECTED   Methadone Scn, Ur NONE DETECTED NONE DETECTED    Comment: (NOTE) Tricyclics + metabolites, urine    Cutoff 1000 ng/mL Amphetamines + metabolites, urine  Cutoff 1000 ng/mL MDMA (Ecstasy), urine              Cutoff 500 ng/mL Cocaine Metabolite, urine          Cutoff 300 ng/mL Opiate + metabolites, urine        Cutoff 300 ng/mL Phencyclidine (PCP), urine         Cutoff 25 ng/mL Cannabinoid, urine                 Cutoff 50 ng/mL Barbiturates + metabolites, urine  Cutoff 200 ng/mL Benzodiazepine, urine  Cutoff 200 ng/mL Methadone, urine                   Cutoff 300 ng/mL  The urine drug screen provides only a preliminary, unconfirmed analytical test result and should not be used for non-medical purposes. Clinical consideration and professional judgment should be applied to any positive drug screen result due to possible interfering substances. A more specific alternate chemical method must be used in order to obtain a confirmed  analytical result. Gas chromatography / mass spectrometry (GC/MS) is the preferred confirm atory method. Performed at [facility], [address] [place], [place] [address]   CBG monitoring, ED     Status: Abnormal   Collection Time: 09/06/21 11:35 PM  Result Value Ref Range   Glucose-Capillary 120 (H) 70 - 99 mg/dL    Comment: Glucose reference range applies only to samples taken after fasting for at least 8 hours.  Resp Panel by RT-PCR (Flu A&B, Covid) Nasopharyngeal Swab     Status: None   Collection Time: 09/07/21 12:17 PM   Specimen: Nasopharyngeal Swab; Nasopharyngeal(NP) swabs in vial transport medium  Result Value Ref Range   SARS Coronavirus 2 by RT PCR NEGATIVE NEGATIVE    Comment: (NOTE) SARS-CoV-2 target nucleic acids are NOT DETECTED.  The SARS-CoV-2 RNA is generally detectable in upper respiratory specimens during the acute phase of infection. The lowest concentration of SARS-CoV-2 viral copies this assay can detect is 138 copies/mL. A negative result does not preclude SARS-Cov-2 infection and should not be used as the sole basis for treatment or other patient management decisions. A negative result may occur with  improper specimen collection/handling, submission of specimen other than nasopharyngeal swab, presence of viral mutation(s) within the areas targeted by this assay, and inadequate number of viral copies(<138 copies/mL). A negative result must be combined with clinical observations, patient history, and epidemiological information. The expected result is Negative.  Fact Sheet for Patients:  BloggerCourse.comhttps://www.fda.gov/media/152166/download  Fact Sheet for Healthcare Providers:  SeriousBroker.ithttps://www.fda.gov/media/152162/download  This test is no t yet approved or cleared by the [place] FDA and  has been authorized for detection and/or diagnosis of SARS-CoV-2 by FDA under an Emergency Use Authorization (EUA). This EUA will remain  in effect (meaning  this test can be used) for the duration of the COVID-19 declaration under Section 564(b)(1) of the Act, 21 U.S.C.section 360bbb-3(b)(1), unless the authorization is terminated  or revoked sooner.       Influenza A by PCR NEGATIVE NEGATIVE   Influenza B by PCR NEGATIVE NEGATIVE    Comment: (NOTE) The Xpert Xpress SARS-CoV-2/FLU/RSV plus assay is intended as an aid in the diagnosis of influenza from Nasopharyngeal swab specimens and should not be used as a sole basis for treatment. Nasal washings and aspirates are unacceptable for Xpert Xpress SARS-CoV-2/FLU/RSV testing.  Fact Sheet for Patients: BloggerCourse.comhttps://www.fda.gov/media/152166/download  Fact Sheet for Healthcare Providers: SeriousBroker.ithttps://www.fda.gov/media/152162/download  This test is not yet approved or cleared by the [place] FDA and has been authorized for detection and/or diagnosis of SARS-CoV-2 by FDA under an Emergency Use Authorization (EUA). This EUA will remain in effect (meaning this test can be used) for the duration of the COVID-19 declaration under Section 564(b)(1) of the Act, 21 U.S.C. section 360bbb-3(b)(1), unless the authorization is terminated or revoked.  Performed at [facility], [address] [place], [place] [address]   CBG monitoring, ED     Status: Abnormal   Collection Time: 09/07/21  4:33 PM  Result Value  Ref Range   Glucose-Capillary 254 (H) 70 - 99 mg/dL    Comment: Glucose reference range applies only to samples taken after fasting for at least 8 hours.  CBG monitoring, ED     Status: Abnormal   Collection Time: 09/07/21  7:39 PM  Result Value Ref Range   Glucose-Capillary 226 (H) 70 - 99 mg/dL    Comment: Glucose reference range applies only to samples taken after fasting for at least 8 hours.  CBG monitoring, ED     Status: Abnormal   Collection Time: 09/07/21  9:21 PM  Result Value Ref Range   Glucose-Capillary 177 (H) 70 - 99 mg/dL    Comment: Glucose reference range applies  only to samples taken after fasting for at least 8 hours.  CBG monitoring, ED     Status: Abnormal   Collection Time: 09/08/21  8:58 AM  Result Value Ref Range   Glucose-Capillary 250 (H) 70 - 99 mg/dL    Comment: Glucose reference range applies only to samples taken after fasting for at least 8 hours.  CBG monitoring, ED     Status: Abnormal   Collection Time: 09/08/21 11:32 AM  Result Value Ref Range   Glucose-Capillary 152 (H) 70 - 99 mg/dL    Comment: Glucose reference range applies only to samples taken after fasting for at least 8 hours.   Comment 1 Notify RN     Current Facility-Administered Medications  Medication Dose Route Frequency Provider Last Rate Last Admin   divalproex (DEPAKOTE) DR tablet 500 mg  500 mg Oral BID Arnaldo Natal, MD   500 mg at 09/08/21 1011   gabapentin (NEURONTIN) capsule 100 mg  100 mg Oral TID Arnaldo Natal, MD   100 mg at 09/08/21 1011   hydrOXYzine (ATARAX/VISTARIL) tablet 100 mg  100 mg Oral Q8H PRN Arnaldo Natal, MD   100 mg at 09/08/21 0243   insulin aspart protamine- aspart (NOVOLOG MIX 70/30) injection 20 Units  20 Units Subcutaneous BID WC Arnaldo Natal, MD   20 Units at 09/08/21 0857   lidocaine (LIDODERM) 5 % 1 patch  1 patch Transdermal Q12H Arnaldo Natal, MD   1 patch at 09/08/21 1013   loperamide (IMODIUM) capsule 2-4 mg  2-4 mg Oral PRN Charm Rings, NP       LORazepam (ATIVAN) tablet 1 mg  1 mg Oral Q6H PRN Charm Rings, NP   1 mg at 09/08/21 0248   LORazepam (ATIVAN) tablet 1 mg  1 mg Oral TID Charm Rings, NP       Followed by   Melene Muller ON 09/09/2021] LORazepam (ATIVAN) tablet 1 mg  1 mg Oral BID Charm Rings, NP       Followed by   Melene Muller ON 09/11/2021] LORazepam (ATIVAN) tablet 1 mg  1 mg Oral Daily Charm Rings, NP       multivitamin with minerals tablet 1 tablet  1 tablet Oral Daily Charm Rings, NP   1 tablet at 09/08/21 1011   ondansetron (ZOFRAN-ODT) disintegrating tablet 4 mg  4 mg Oral Q6H PRN Charm Rings, NP       thiamine tablet 100 mg  100 mg Oral Daily Charm Rings, NP   100 mg at 09/08/21 1011   Current Outpatient Medications  Medication Sig Dispense Refill   acetaminophen (TYLENOL) 325 MG tablet Take 650 mg by mouth every 6 (six) hours as needed. (Patient not taking: No  sig reported)     divalproex (DEPAKOTE) 500 MG DR tablet Take 1 tablet (500 mg total) by mouth 2 (two) times daily. 60 tablet 3   gabapentin (NEURONTIN) 100 MG capsule Take 1 capsule (100 mg total) by mouth 3 (three) times daily. 90 capsule 0   hydrOXYzine (VISTARIL) 50 MG capsule Take 2 capsules (100 mg total) by mouth every 8 (eight) hours as needed for anxiety. 60 capsule 1   insulin NPH-regular Human (70-30) 100 UNIT/ML injection Inject 15-35 Units into the skin 2 (two) times daily. 10 mL 11   Lidocaine (HM LIDOCAINE PATCH) 4 % PTCH Apply 1 patch topically every 12 (twelve) hours. (Patient not taking: No sig reported) 10 patch 0    Musculoskeletal: Strength & Muscle Tone: decreased Gait & Station: normal Patient leans: N/A  Psychiatric Specialty Exam: Blood pressure (!) 158/76, pulse 98, temperature 98.3 F (36.8 C), temperature source Oral, resp. rate 18, weight 100.3 kg, SpO2 98 %.Body mass index is 26.92 kg/m.  General Appearance: Casual  Eye Contact:  Fair  Speech:  Clear and Coherent  Volume:  Normal  Mood: Depressed  Affect:  Tearful  Thought Process:  Coherent and Descriptions of Associations: Intact  Orientation:  Full (Time, Place, and Person)  Thought Content:  WDL and Logical  Suicidal Thoughts:  No  Homicidal Thoughts:  No  Memory:  Immediate;   Fair Recent;   Fair Remote;   Fair  Judgement:  Poor  Insight:  Fair  Psychomotor Activity:  Decreased  Concentration:  Concentration: Fair and Attention Span: Fair  Recall:  [employer] of Knowledge:  Fair  Language:  Good  Akathisia:  No  Handed:  Right  AIMS (if indicated):     Assets:  Leisure Time Resilience  ADL's:  Intact   Cognition:  WNL  Sleep:        Physical Exam: Physical Exam Vitals and nursing note reviewed.  Constitutional:      Appearance: Normal appearance.  HENT:     Head: Normocephalic.     Nose: Nose normal.  Pulmonary:     Effort: Pulmonary effort is normal.  Musculoskeletal:        General: Normal range of motion.     Cervical back: Normal range of motion.  Neurological:     General: No focal deficit present.     Mental Status: He is alert and oriented to person, place, and time.  Psychiatric:        Attention and Perception: Attention and perception normal.        Mood and Affect: Mood is depressed. Mood is not anxious.        Speech: Speech normal.        Behavior: Behavior normal. Behavior is cooperative.        Thought Content: Thought content includes suicidal ideation. Thought content includes suicidal plan.        Cognition and Memory: Cognition and memory normal.        Judgment: Judgment is impulsive.   Review of Systems  Psychiatric/Behavioral:  Positive for depression, substance abuse and suicidal ideas. The patient is nervous/anxious.   All other systems reviewed and are negative. Blood pressure (!) 158/76, pulse 98, temperature 98.3 F (36.8 C), temperature source Oral, resp. rate 18, weight 100.3 kg, SpO2 98 %. Body mass index is 26.92 kg/m.  Treatment Plan Summary: Daily contact with patient to assess and evaluate symptoms and progress in treatment, Medication management, and Plan : Alcohol use  disorder: -Started Ativan detox protocol -Held gabapentin 100 mg TID given his recent overdose -Started Prozac 20 mg daily  Major depressive disorder, recurrent, severe without psychosis: -Admit to inpatient psychiatric unit for stable  Disposition: Recommend psychiatric Inpatient admission when medically cleared.  Nanine Means, NP 09/08/2021 3:09 PM

## 2021-09-08 NOTE — ED Notes (Signed)
Pt asking for another snack try, writer let pt know that there is no more trays left and will be ordering some, will let pt know when they come to the floor and also reminded that lunch should be here shortly

## 2021-09-08 NOTE — BH Assessment (Signed)
Referral Check  Brandon Silva (Brandon Silva), Declined due to recent seizure and chronic cocaine and alcohol use.  [facility] [phone] -or- [phone]), left message with receptionist for return phone call.   Brandon ([name]), Spoke with intake and they said they will have to call back with an update.  Brandon Silva [phone]), No answer   Brandon Onnie Graham [phone] -[phone]), Declined due to having "uncontrollable seizures."   09/07/2021-Brandon Silva [phone] or [phone]), Staff reports facility is currently in quarantine due to Covid outbreak, currently not accepting patients at this time

## 2021-09-08 NOTE — ED Provider Notes (Signed)
Emergency Medicine Observation Re-evaluation Note  Brandon Silva is a 60 y.o. male, seen on rounds today.  Pt initially presented to the ED for complaints of Seizures (Ems witnessed seizure. Etoh on board), Depression, Anxiety, Alcohol Problem, Suicidal, and Addiction Problem Currently, the patient is sleeping.  Physical Exam  BP 118/66 (BP Location: Right Arm)   Pulse 99   Temp 98.7 F (37.1 C) (Oral)   Resp 18   Wt 100.3 kg   SpO2 98%   BMI 26.92 kg/m  Physical Exam Gen: No acute distress  Resp: Normal rise and fall of chest Neuro: Moving all four extremities Psych: Resting currently, calm and cooperative when awake    ED Course / MDM  EKG:EKG Interpretation  Date/Time:  Friday September 06 2021 23:23:51 EDT Ventricular Rate:  74 PR Interval:  141 QRS Duration: 92 QT Interval:  423 QTC Calculation: 470 R Axis:   9 Text Interpretation: Sinus rhythm Borderline ST elevation, anterior leads No significant change since last tracing Confirmed by Alaa Eyerman, Baxter Hire [phone]) on 09/06/2021 11:34:48 PM  I have reviewed the labs performed to date as well as medications administered while in observation.  Recent changes in the last 24 hours include no acute events overnight.  Plan  Current plan is for inpatient psychiatric treatment. [facility] Bahri is under involuntary commitment.      Kirah Stice, Layla Maw, DO 09/08/21 [phone]

## 2021-09-08 NOTE — ED Notes (Signed)
Mother updated per patient request.

## 2021-09-08 NOTE — ED Notes (Signed)
Pt has lunch tray at this time

## 2021-09-08 NOTE — ED Notes (Signed)
This rn attempted to contact viola, mother, with no answer at this time.

## 2021-09-09 DIAGNOSIS — F332 Major depressive disorder, recurrent severe without psychotic features: Secondary | ICD-10-CM

## 2021-09-09 DIAGNOSIS — F10920 Alcohol use, unspecified with intoxication, uncomplicated: Secondary | ICD-10-CM

## 2021-09-09 LAB — CBG MONITORING, ED: Glucose-Capillary: 140 mg/dL — ABNORMAL HIGH (ref 70–99)

## 2021-09-09 NOTE — ED Notes (Signed)
Pt took shower °

## 2021-09-09 NOTE — ED Notes (Signed)
E-signature not working at this time. Pt verbalized understanding of D/C instructions, prescriptions and follow up care with no further questions at this time. Pt in NAD and ambulatory at time of D/C. Pt given taxi voucher at time of discharge. First nurse aware.

## 2021-09-09 NOTE — ED Notes (Signed)
IVC pending placement 

## 2021-09-09 NOTE — ED Notes (Signed)
Pt given breakfast tray

## 2021-09-09 NOTE — BH Assessment (Signed)
Psych Team met with patient for reassessment. Patient reports he drank a few beers and began feeling sick. Patient states he took what he thought was aspirin and had no intentions of wanting to hurt nor kill himself. Patient reports he lives with his mom and is her primary caregiver. Patient reports he is "fine" and sees his PCP at [facility]. Patient denies SI/HI/AH/VH.   Per Barbaraann Share, NP, patient does not meet criteria for inpatient psychiatric admission

## 2021-09-09 NOTE — Consult Note (Signed)
[facility]   Reason for Consult:  OD, Alcohol abuse Referring Physician:  Artis DelayMary Silva Patient Identification: Brandon BrennerDavid Silva MRN:  [id number] Principal Diagnosis: <principal problem not specified> Diagnosis:  Active Problems:   Alcohol intoxication (HCC)   Major depressive disorder, recurrent severe without psychotic features (HCC)   Total Time spent with patient: 20 minutes  Subjective:  "I need to get home to take care of my mother. 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 male patient admitted with seizures, overdose. Patient was seen and re-evaluated by Brandon Silva and Brandon Silva. Patient tells Brandon Silva that he drank 4 or 5 beers and his stomach started to hurt, so he took what he thought was aspirin. Advised patient that aspirin is definitely not something to take for a stomach ache; Maalox or Tums is appropriate. Today, patient states that he did not take the medication as an intentional overdose. He admits that he "had too much to drink" and that may have affected his actions. Patient does have a seizure disorder and he had a seizure en  route to ED. Patient admits that he likely forgot his medication prior to seizure. Discussion regarding taking medication as prescribed. Patient clearly denies any suicidal ideation or intent at this time. Denies homicidal ideations, auditory or visual hallucinations.   Patient states he is feeling "fine", no active depression, and needs to get home to take care of his mother. He expresses forward-thinking ideas and is requesting discharge. He is followed at [facility] for medical. He states that there is a psychiatrist there to follow up with, but he is also going to connect with [facility].     Past Psychiatric History: Depression, anxiety, alcohol use disorder  Risk to Self:   Risk to Others:   Prior Inpatient Therapy:   Prior Outpatient Therapy:    Past Medical History:  Past Medical History:  Diagnosis Date   Alcohol  abuse    Diabetes mellitus without complication (HCC)    Hypertension    Seizures (HCC)    Ventilator dependence (HCC)    History reviewed. No pertinent surgical history. Family History:  Family History  Problem Relation Age of Onset   Diabetes Other    Family Psychiatric  History: none Social History:  Social History   Substance and Sexual Activity  Alcohol Use Yes     Social History   Substance and Sexual Activity  Drug Use No    Social History   Socioeconomic History   Marital status: Single    Spouse name: Not on file   Number of children: Not on file   Years of education: Not on file   Highest education level: Not on file  Occupational History   Not on file  Tobacco Use   Smoking status: Every Day    Types: Cigarettes   Smokeless tobacco: Never  Substance and Sexual Activity   Alcohol use: Yes   Drug use: No   Sexual activity: Yes  Other Topics Concern   Not on file  Social History Narrative   Not on file   Social Determinants of Health   Financial Resource Strain: Not on file  Food Insecurity: Not on file  Transportation Needs: Not on file  Physical Activity: Not on file  Stress: Not on file  Social Connections: Not on file   Additional Social History:    Allergies:   Allergies  Allergen Reactions   Penicillins Rash    Has patient had a PCN  reaction causing immediate rash, facial/tongue/throat swelling, SOB or lightheadedness with hypotension: Yes Has patient had a PCN reaction causing severe rash involving mucus membranes or skin necrosis: No Has patient had a PCN reaction that required hospitalization: No Has patient had a PCN reaction occurring within the last 10 years: No If all of the above answers are "NO", then may proceed with Cephalosporin use.     Labs:  Results for orders placed or performed during the hospital encounter of 09/06/21 (from the past 48 hour(s))  CBG monitoring, ED     Status: Abnormal   Collection Time: 09/07/21   4:33 PM  Result Value Ref Range   Glucose-Capillary 254 (H) 70 - 99 mg/dL    Comment: Glucose reference range applies only to samples taken after fasting for at least 8 hours.  CBG monitoring, ED     Status: Abnormal   Collection Time: 09/07/21  7:39 PM  Result Value Ref Range   Glucose-Capillary 226 (H) 70 - 99 mg/dL    Comment: Glucose reference range applies only to samples taken after fasting for at least 8 hours.  CBG monitoring, ED     Status: Abnormal   Collection Time: 09/07/21  9:21 PM  Result Value Ref Range   Glucose-Capillary 177 (H) 70 - 99 mg/dL    Comment: Glucose reference range applies only to samples taken after fasting for at least 8 hours.  CBG monitoring, ED     Status: Abnormal   Collection Time: 09/08/21  8:58 AM  Result Value Ref Range   Glucose-Capillary 250 (H) 70 - 99 mg/dL    Comment: Glucose reference range applies only to samples taken after fasting for at least 8 hours.  CBG monitoring, ED     Status: Abnormal   Collection Time: 09/08/21 11:32 AM  Result Value Ref Range   Glucose-Capillary 152 (H) 70 - 99 mg/dL    Comment: Glucose reference range applies only to samples taken after fasting for at least 8 hours.   Comment 1 Notify RN   CBG monitoring, ED     Status: Abnormal   Collection Time: 09/08/21  4:45 PM  Result Value Ref Range   Glucose-Capillary 223 (H) 70 - 99 mg/dL    Comment: Glucose reference range applies only to samples taken after fasting for at least 8 hours.  CBG monitoring, ED     Status: Abnormal   Collection Time: 09/08/21  9:55 PM  Result Value Ref Range   Glucose-Capillary 283 (H) 70 - 99 mg/dL    Comment: Glucose reference range applies only to samples taken after fasting for at least 8 hours.  CBG monitoring, ED     Status: Abnormal   Collection Time: 09/09/21  8:15 AM  Result Value Ref Range   Glucose-Capillary 140 (H) 70 - 99 mg/dL    Comment: Glucose reference range applies only to samples taken after fasting for at  least 8 hours.    Current Facility-Administered Medications  Medication Dose Route Frequency Provider Last Rate Last Admin   divalproex (DEPAKOTE) DR tablet 500 mg  500 mg Oral BID Arnaldo Natal, MD   500 mg at 09/09/21 0902   FLUoxetine (PROZAC) capsule 20 mg  20 mg Oral Daily Charm Rings, NP   20 mg at 09/09/21 0902   gabapentin (NEURONTIN) capsule 100 mg  100 mg Oral TID Arnaldo Natal, MD   100 mg at 09/09/21 0902   hydrOXYzine (ATARAX/VISTARIL) tablet 100 mg  100 mg  Oral Q8H PRN Arnaldo Natal, MD   100 mg at 09/08/21 0243   insulin aspart protamine- aspart (NOVOLOG MIX 70/30) injection 20 Units  20 Units Subcutaneous BID WC Arnaldo Natal, MD   20 Units at 09/09/21 0928   lidocaine (LIDODERM) 5 % 1 patch  1 patch Transdermal Q12H Arnaldo Natal, MD   1 patch at 09/09/21 2595   loperamide (IMODIUM) capsule 2-4 mg  2-4 mg Oral PRN Charm Rings, NP       LORazepam (ATIVAN) tablet 1 mg  1 mg Oral Q6H PRN Charm Rings, NP   1 mg at 09/08/21 0248   LORazepam (ATIVAN) tablet 1 mg  1 mg Oral BID Charm Rings, NP       Followed by   Melene Muller ON 09/11/2021] LORazepam (ATIVAN) tablet 1 mg  1 mg Oral Daily Charm Rings, NP       multivitamin with minerals tablet 1 tablet  1 tablet Oral Daily Charm Rings, NP   1 tablet at 09/09/21 0903   ondansetron (ZOFRAN-ODT) disintegrating tablet 4 mg  4 mg Oral Q6H PRN Charm Rings, NP       thiamine tablet 100 mg  100 mg Oral Daily Charm Rings, NP   100 mg at 09/09/21 6387   Current Outpatient Medications  Medication Sig Dispense Refill   acetaminophen (TYLENOL) 325 MG tablet Take 650 mg by mouth every 6 (six) hours as needed. (Patient not taking: No sig reported)     divalproex (DEPAKOTE) 500 MG DR tablet Take 1 tablet (500 mg total) by mouth 2 (two) times daily. 60 tablet 3   gabapentin (NEURONTIN) 100 MG capsule Take 1 capsule (100 mg total) by mouth 3 (three) times daily. 90 capsule 0   hydrOXYzine (VISTARIL) 50 MG capsule  Take 2 capsules (100 mg total) by mouth every 8 (eight) hours as needed for anxiety. 60 capsule 1   insulin NPH-regular Human (70-30) 100 UNIT/ML injection Inject 15-35 Units into the skin 2 (two) times daily. 10 mL 11   Lidocaine (HM LIDOCAINE PATCH) 4 % PTCH Apply 1 patch topically every 12 (twelve) hours. (Patient not taking: No sig reported) 10 patch 0    Musculoskeletal: Strength & Muscle Tone: within normal limits Gait & Station: normal Patient leans: N/A            Psychiatric Specialty Exam:  Presentation  General Appearance:  Casual; Appropriate for Environment Eye Contact: Good Speech: Clear and Coherent Speech Volume: Normal Handedness: No data recorded  Mood and Affect  Mood: Euthymic Affect: Appropriate  Thought Process  Thought Processes: Coherent Descriptions of Associations:Intact Orientation:No data recorded Thought Content:Logical History of Schizophrenia/Schizoaffective disorder:No data recorded Duration of Psychotic Symptoms:No data recorded Hallucinations:Hallucinations: None (Denies) Ideas of Reference:None (Denies) Suicidal Thoughts:Suicidal Thoughts: No (Denies) Homicidal Thoughts:Homicidal Thoughts: No (Denies)  Sensorium  Memory: Immediate Good; Recent Fair Judgment: Fair Insight: Fair  Art therapist  Concentration: Good Attention Span: Good Recall: Good Fund of Knowledge: Good Language: Good  Psychomotor Activity  Psychomotor Activity: Psychomotor Activity: Normal  Assets  Assets: Desire for Improvement; Housing; Resilience  Sleep  Sleep: Sleep: Good  Physical Exam: Physical Exam Vitals (pt in no acute distress) and nursing note reviewed.  HENT:     Head: Normocephalic.     Nose: No congestion or rhinorrhea.  Eyes:     General:        Right eye: No discharge.  Left eye: No discharge.  Cardiovascular:     Rate and Rhythm: Normal rate.  Musculoskeletal:        General: Normal range  of motion.     Cervical back: Normal range of motion.  Skin:    General: Skin is warm and dry.  Neurological:     Mental Status: He is alert and oriented to person, place, and time.  Psychiatric:        Mood and Affect: Mood normal.   Review of Systems  Psychiatric/Behavioral:  Negative for depression, hallucinations, memory loss, substance abuse and suicidal ideas. The patient is not nervous/anxious and does not have insomnia.        Hx of MDD . Stable for discharge  All other systems reviewed and are negative. Blood pressure 140/70, pulse 67, temperature 97.8 F (36.6 C), temperature source Oral, resp. rate 17, weight 100.3 kg, SpO2 100 %. Body mass index is 26.92 kg/m.  Treatment Plan Summary: Plan: Patient is a 60 year old make with a history of Major depressive disorder, alcohol use disorder, seizures who presented to the ED on 9/92022 via EMS with a BAL of 301, UDS positive for benzodiazepines, and cocaine. On this date, patient denies suicidal ideation or intent, Denies  homicidal ideations, auditory or visual hallucinations. Patient expresses desire for discharge and expresses forward-thinking thoughts. Information provided to patient regarding [facility] contact.    Disposition: No evidence of imminent risk to self or others at present.   Patient does not meet criteria for psychiatric inpatient admission. Supportive therapy provided about ongoing stressors. Discussed crisis plan, support from social network, calling 911, coming to the Emergency Department, and calling Suicide Hotline.  Vanetta Mulders, NP 09/09/2021 12:53 PM

## 2021-09-09 NOTE — ED Notes (Signed)
VS not obtained at this time d/t to pt being asleep.

## 2021-09-09 NOTE — ED Provider Notes (Signed)
Emergency Medicine Observation Re-evaluation Note  Brandon Silva is a 60 y.o. male, seen on rounds today.  Pt initially presented to the ED for complaints of Seizures (Ems witnessed seizure. Etoh on board), Depression, Anxiety, Alcohol Problem, Suicidal, and Addiction Problem Currently, the patient is resting, voices no medical complaints.  Physical Exam  BP (!) 145/90   Pulse 76   Temp 98.9 F (37.2 C) (Oral)   Resp 17   Wt 100.3 kg   SpO2 98%   BMI 26.92 kg/m  Physical Exam General: Resting in no acute distress Cardiac: No cyanosis Lungs: Equal rise and fall Psych: Not agitated  ED Course / MDM  EKG:EKG Interpretation  Date/Time:  Friday September 06 2021 23:23:51 EDT Ventricular Rate:  74 PR Interval:  141 QRS Duration: 92 QT Interval:  423 QTC Calculation: 470 R Axis:   9 Text Interpretation: Sinus rhythm Borderline ST elevation, anterior leads No significant change since last tracing Confirmed by Ward, Baxter Hire [phone]) on 09/06/2021 11:34:48 PM  I have reviewed the labs performed to date as well as medications administered while in observation.  Recent changes in the last 24 hours include no events overnight.  Plan  Current plan is for psychiatric disposition. Brandon Silva is under involuntary commitment.      Irean Hong, MD 09/09/21 [phone]

## 2021-09-09 NOTE — ED Provider Notes (Signed)
Cleared by psychiatry for discharge and follow up [facility]    Concha Se, MD 09/09/21 1212

## 2022-02-26 DEATH — deceased

## 2022-11-17 IMAGING — CT CT HEAD W/O CM
3 series · 15 of 47 positions shown, 18 images · non-contrast
Comparison: Prior head CT examinations 01/01/2021 and earlier.
Brain MRI 09/20/2016.

CLINICAL DATA: Mental status change, unknown cause. Additional
history provided: Patient found unresponsive, face down at home.

EXAM:
CT HEAD WITHOUT CONTRAST
TECHNIQUE: Contiguous axial images were obtained from the base of the skull
through the vertex without intravenous contrast.

[Series 3: head wo · axial · 0.44mm/px · z∈[-127,-2]mm · 9 of 31 slices shown, 12 images]
[im 3/31  brain]
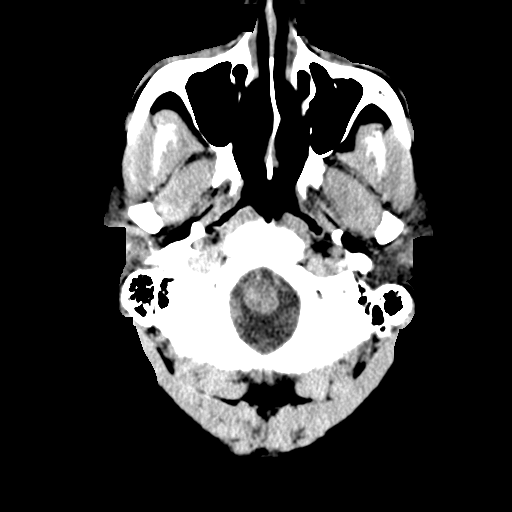
[im 3/31  bone]
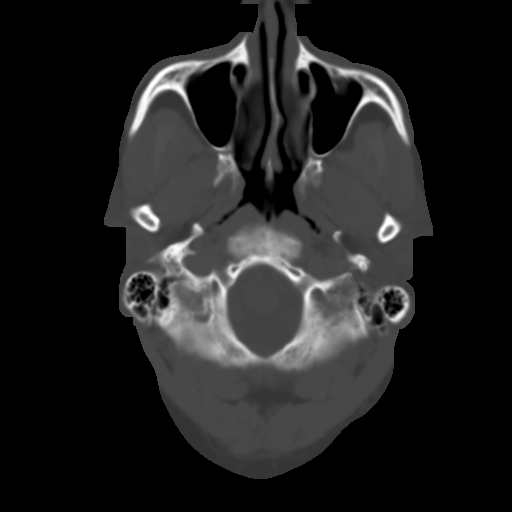
[im 6/31  brain]
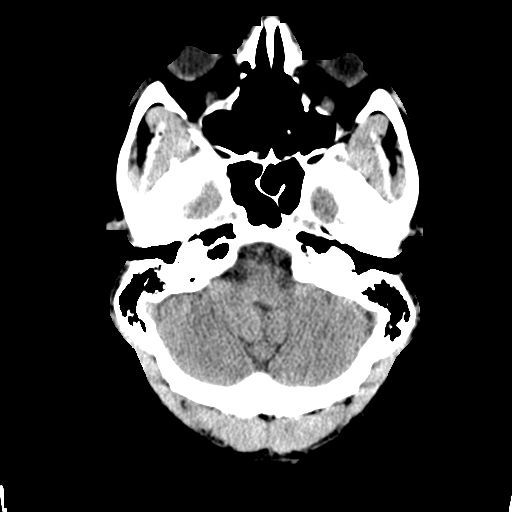
[im 9/31  brain]
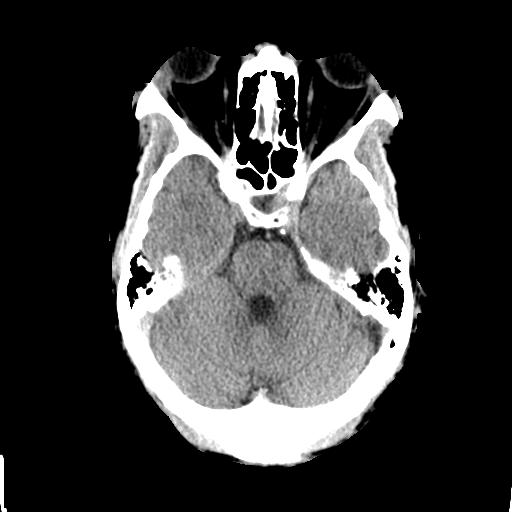
[im 12/31  brain]
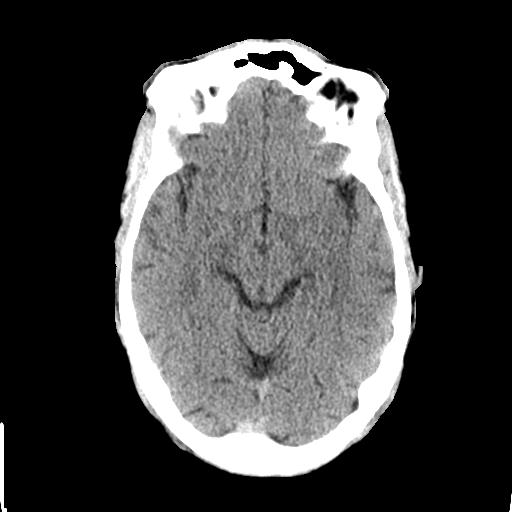
[im 16/31  brain]
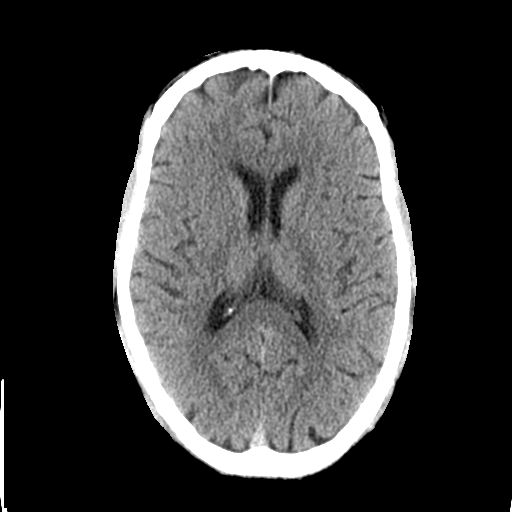
[im 16/31  bone]
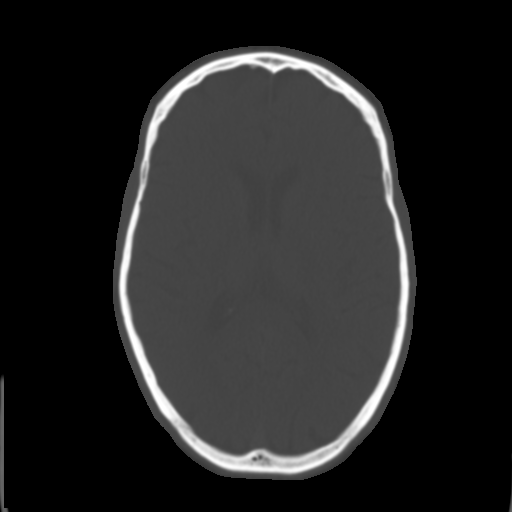
[im 19/31  brain]
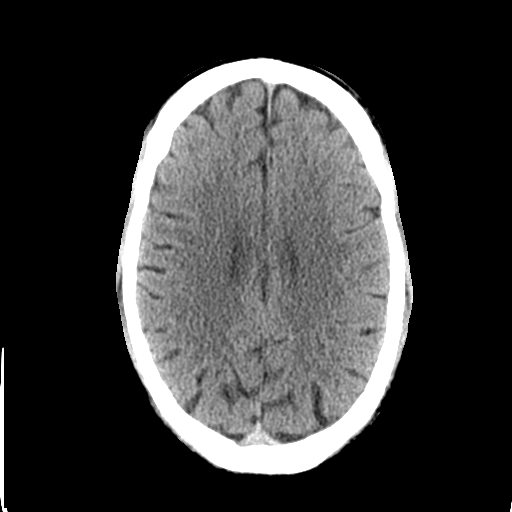
[im 22/31  brain]
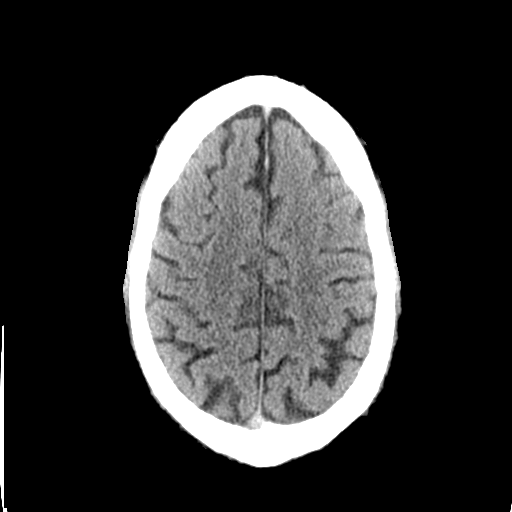
[im 25/31  brain]
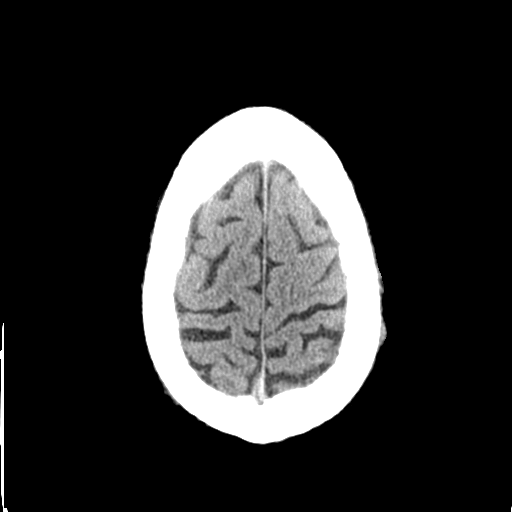
[im 28/31  brain]
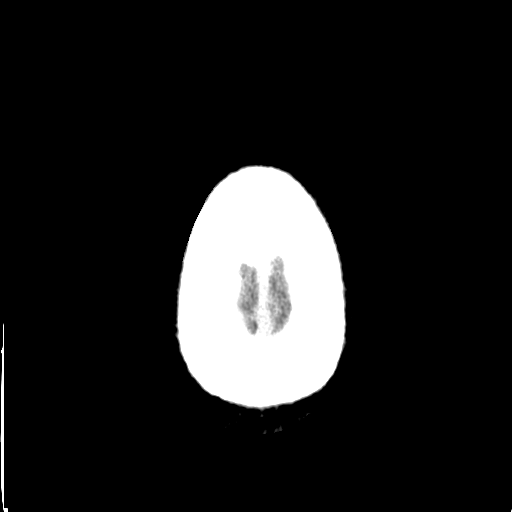
[im 28/31  bone]
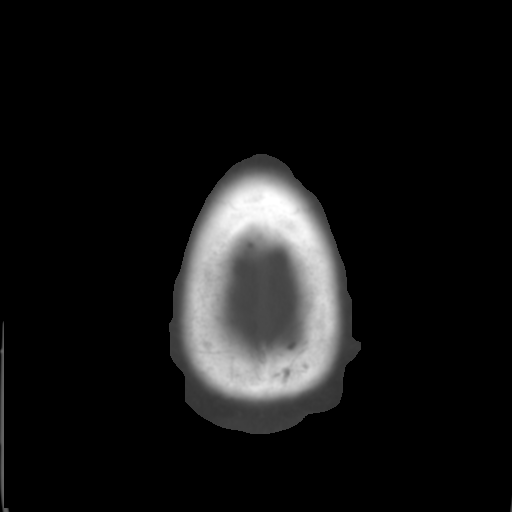

[Series 4: coronal soft tissue · coronal · 0.32mm/px · 3 of 66 slices shown]
[im 22/66  brain]
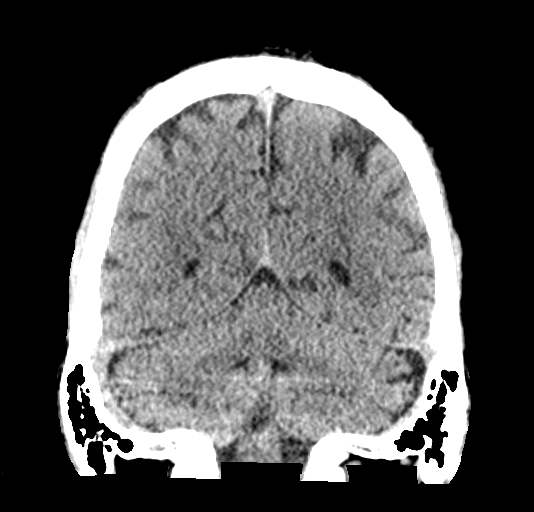
[im 29/66  brain]
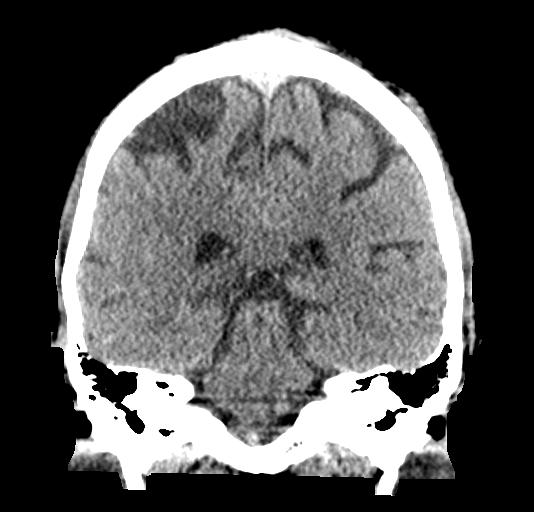
[im 37/66  brain]
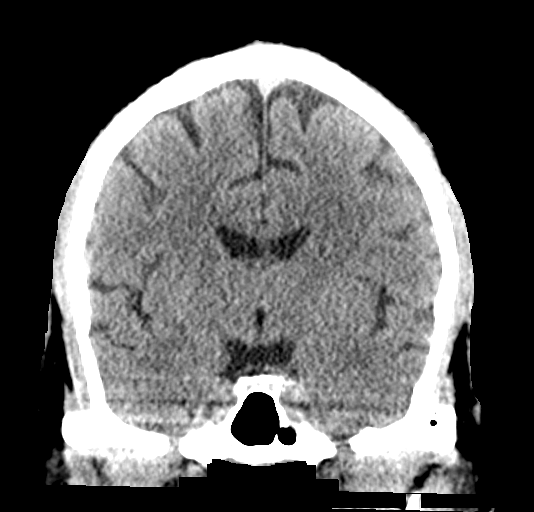

[Series 5: sagittal soft tissue · sagittal · 0.32mm/px · 3 of 51 slices shown]
[im 17/51  brain]
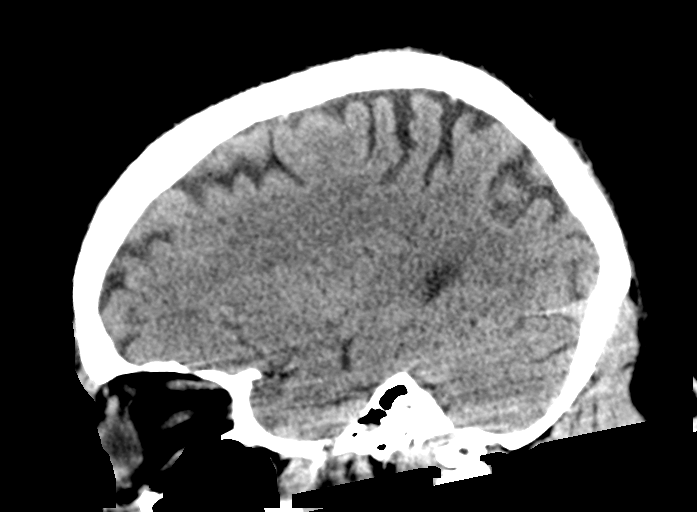
[im 26/51  brain]
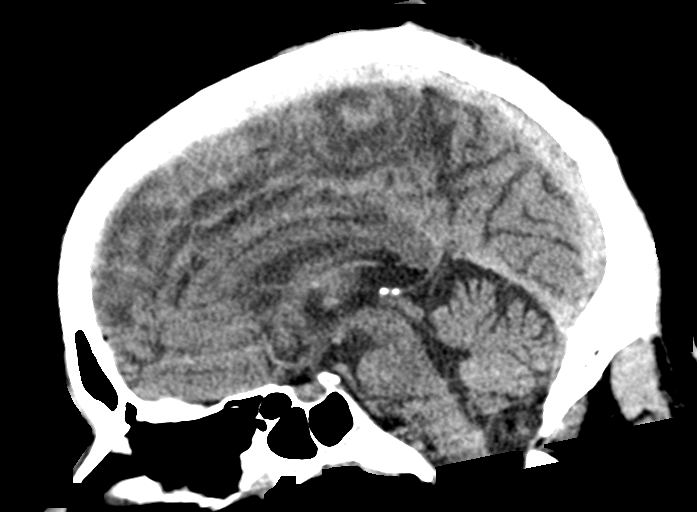
[im 34/51  brain]
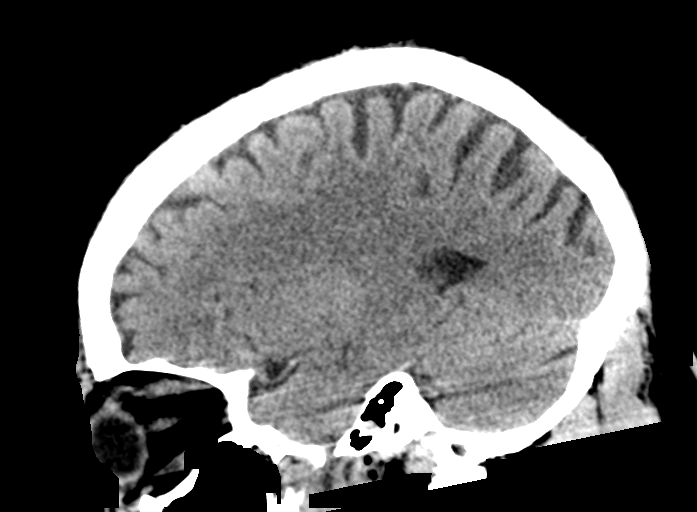

[15 of 47 positions shown; findings below may reference images not displayed]

FINDINGS: Brain:

Mild cerebral atrophy.

There is no acute intracranial hemorrhage.

No demarcated cortical infarct.

No extra-axial fluid collection.

No evidence of intracranial mass.

No midline shift.

Vascular: No hyperdense vessel.

Skull: Normal. Negative for fracture or focal lesion.

Sinuses/Orbits: Visualized orbits show no acute finding.
Redemonstrated bilateral exophthalmos suggestive of thyroid
associated eye disease. Mild scattered paranasal sinus mucosal
thickening at the imaged levels. Small osteomas within the left
frontal and ethmoid sinuses.
IMPRESSION: No evidence of acute intracranial abnormality.

Stable mild cerebral atrophy.

Mild paranasal sinus mucosal thickening at the imaged levels.

Redemonstrated chronic bilateral exophthalmos suggestive of thyroid
associated eye disease.

## 2022-11-18 IMAGING — DX DG ABDOMEN 1V
1 series · 1 of 1 positions shown · non-contrast
Comparison: None.

CLINICAL DATA: Unresponsive.  Vomiting.

EXAM:
ABDOMEN - 1 VIEW

[abdomen supine]
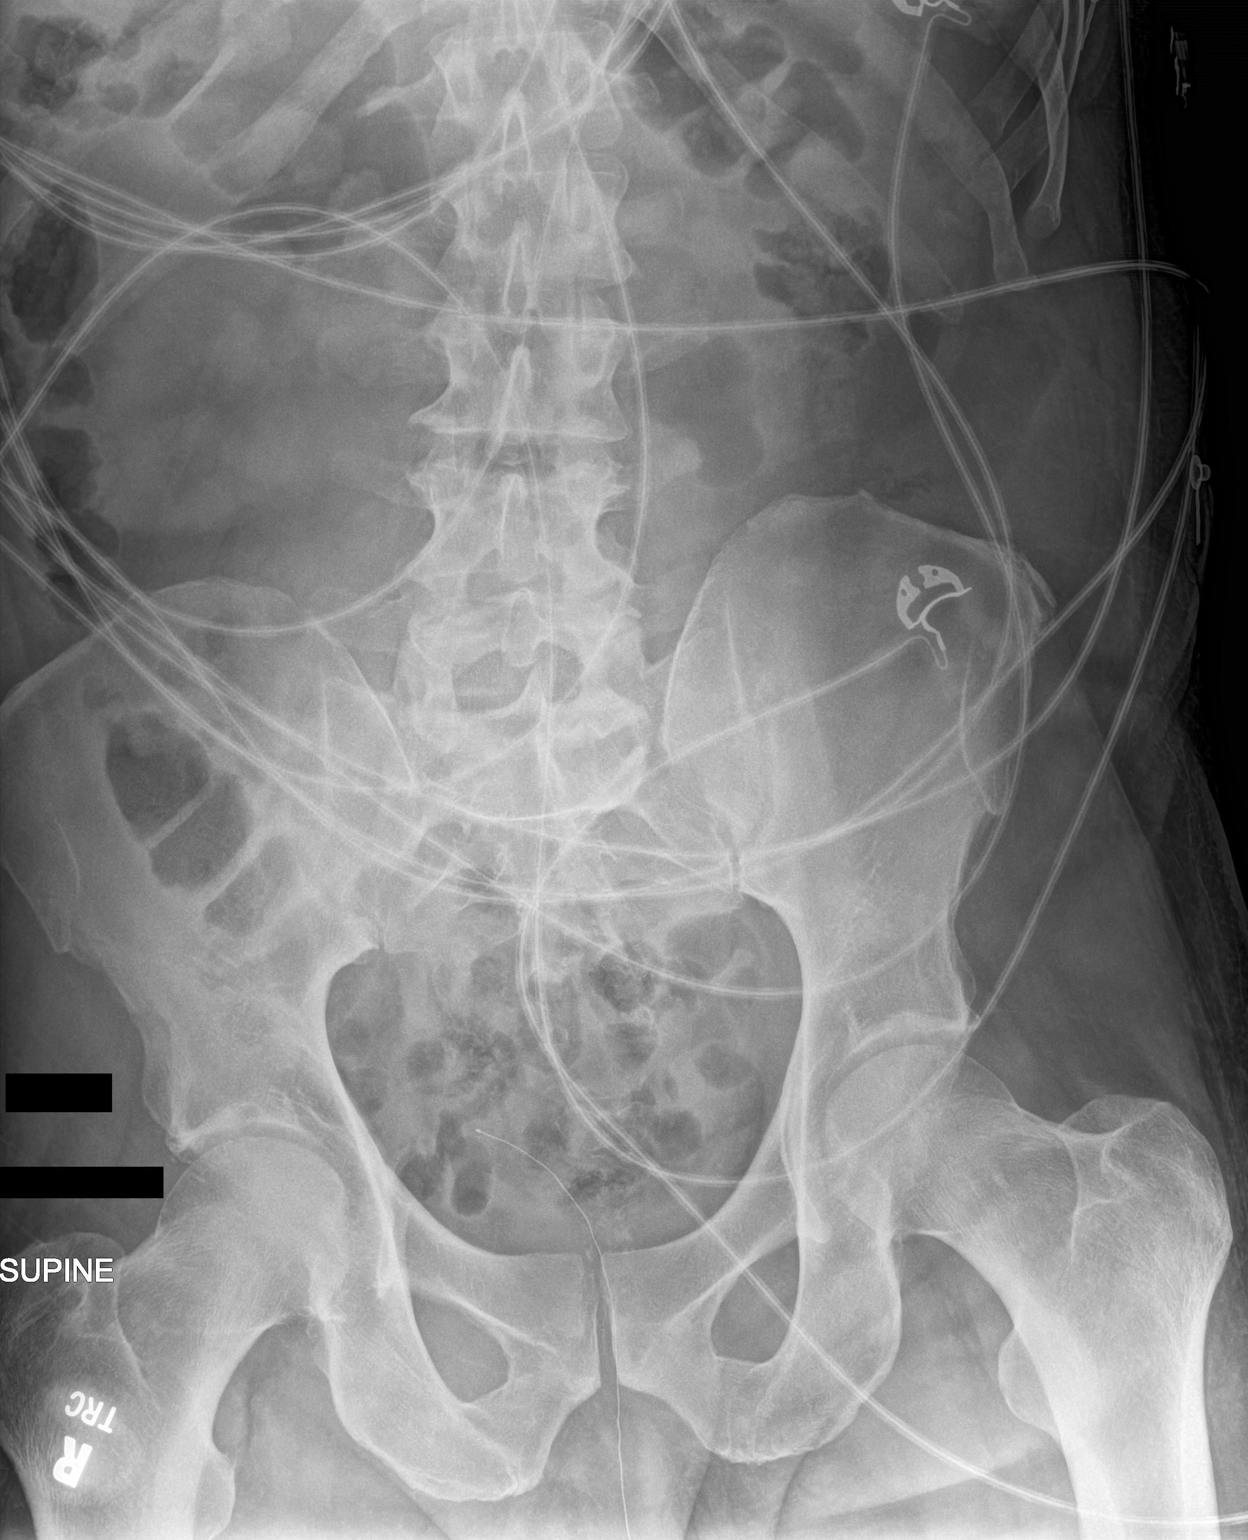

[1 of 1 positions shown; findings below may reference images not displayed]

FINDINGS: The bowel gas pattern is normal. No radio-opaque calculi or other
significant radiographic abnormality are seen.
IMPRESSION: Negative.
